# Patient Record
Sex: Male | Born: 1970 | Race: White | Hispanic: No | Marital: Single | Smoking: Never smoker
Health system: Southern US, Community
[De-identification: ages and names within clinical notes are randomized; demographics above are authoritative.]

## PROBLEM LIST (undated history)

## (undated) DIAGNOSIS — E119 Type 2 diabetes mellitus without complications: Secondary | ICD-10-CM

## (undated) DIAGNOSIS — I10 Essential (primary) hypertension: Secondary | ICD-10-CM

## (undated) DIAGNOSIS — N289 Disorder of kidney and ureter, unspecified: Secondary | ICD-10-CM

## (undated) DIAGNOSIS — M1990 Unspecified osteoarthritis, unspecified site: Secondary | ICD-10-CM

## (undated) DIAGNOSIS — M109 Gout, unspecified: Secondary | ICD-10-CM

## (undated) DIAGNOSIS — D649 Anemia, unspecified: Secondary | ICD-10-CM

## (undated) DIAGNOSIS — E7800 Pure hypercholesterolemia, unspecified: Secondary | ICD-10-CM

## (undated) HISTORY — PX: NO PAST SURGERIES: SHX2092

---

## 2014-02-28 ENCOUNTER — Ambulatory Visit (HOSPITAL_COMMUNITY)
Admission: RE | Admit: 2014-02-28 | Discharge: 2014-02-28 | Disposition: A | Discharge status: Home / Self Care | Payer: Disability Insurance | Source: Ambulatory Visit | Attending: Family Medicine | Admitting: Family Medicine

## 2014-02-28 ENCOUNTER — Other Ambulatory Visit (HOSPITAL_COMMUNITY): Payer: Self-pay | Admitting: Family Medicine

## 2014-02-28 DIAGNOSIS — M25461 Effusion, right knee: Secondary | ICD-10-CM | POA: Diagnosis not present

## 2014-02-28 DIAGNOSIS — M25562 Pain in left knee: Principal | ICD-10-CM

## 2014-02-28 DIAGNOSIS — M25462 Effusion, left knee: Secondary | ICD-10-CM | POA: Insufficient documentation

## 2014-02-28 DIAGNOSIS — M25561 Pain in right knee: Secondary | ICD-10-CM | POA: Diagnosis present

## 2015-10-18 IMAGING — CR DG KNEE 1-2V*R*
2 series · 2 of 2 positions shown · non-contrast
Comparison: None.

CLINICAL DATA: Bilateral knee pain and swelling for 4 years.
Patient was diagnosed with arthritis about a year ago. Disability
determination.

EXAM:
RIGHT KNEE - 1-2 VIEW

[view not recorded (1 of 2)]
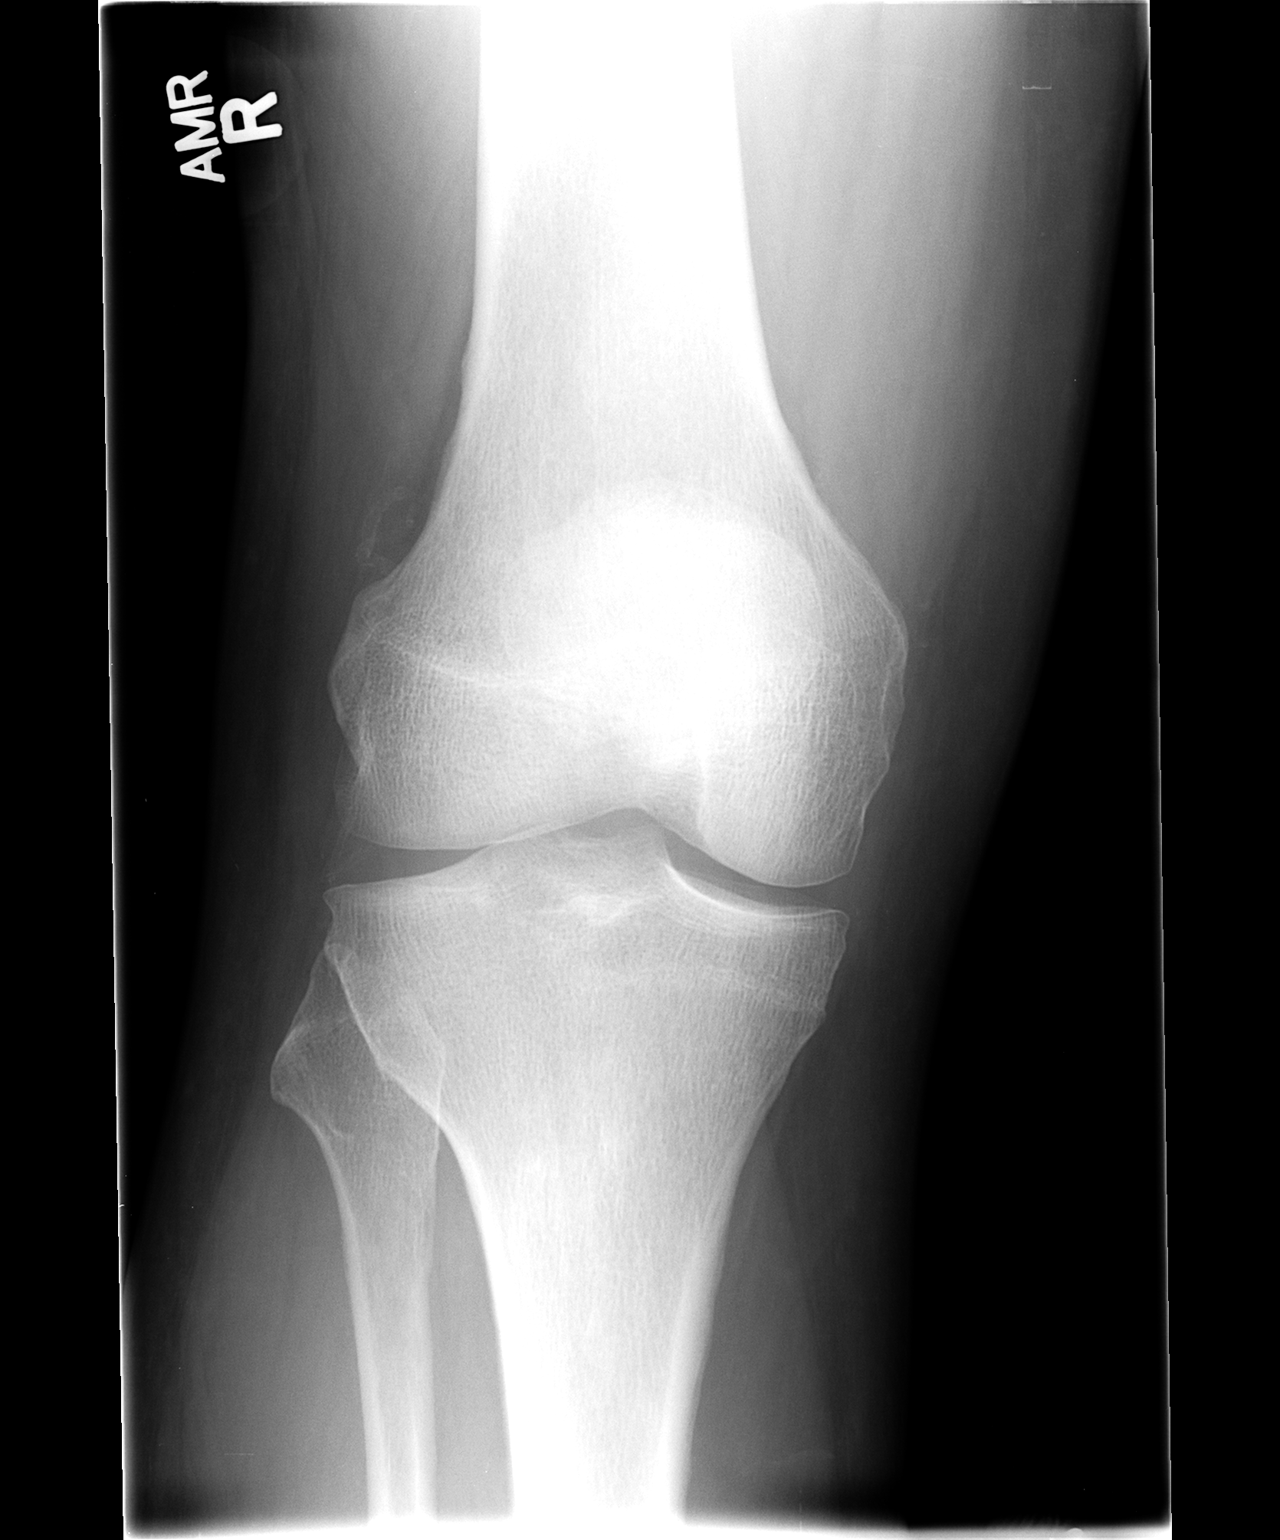

[view not recorded (2 of 2)]
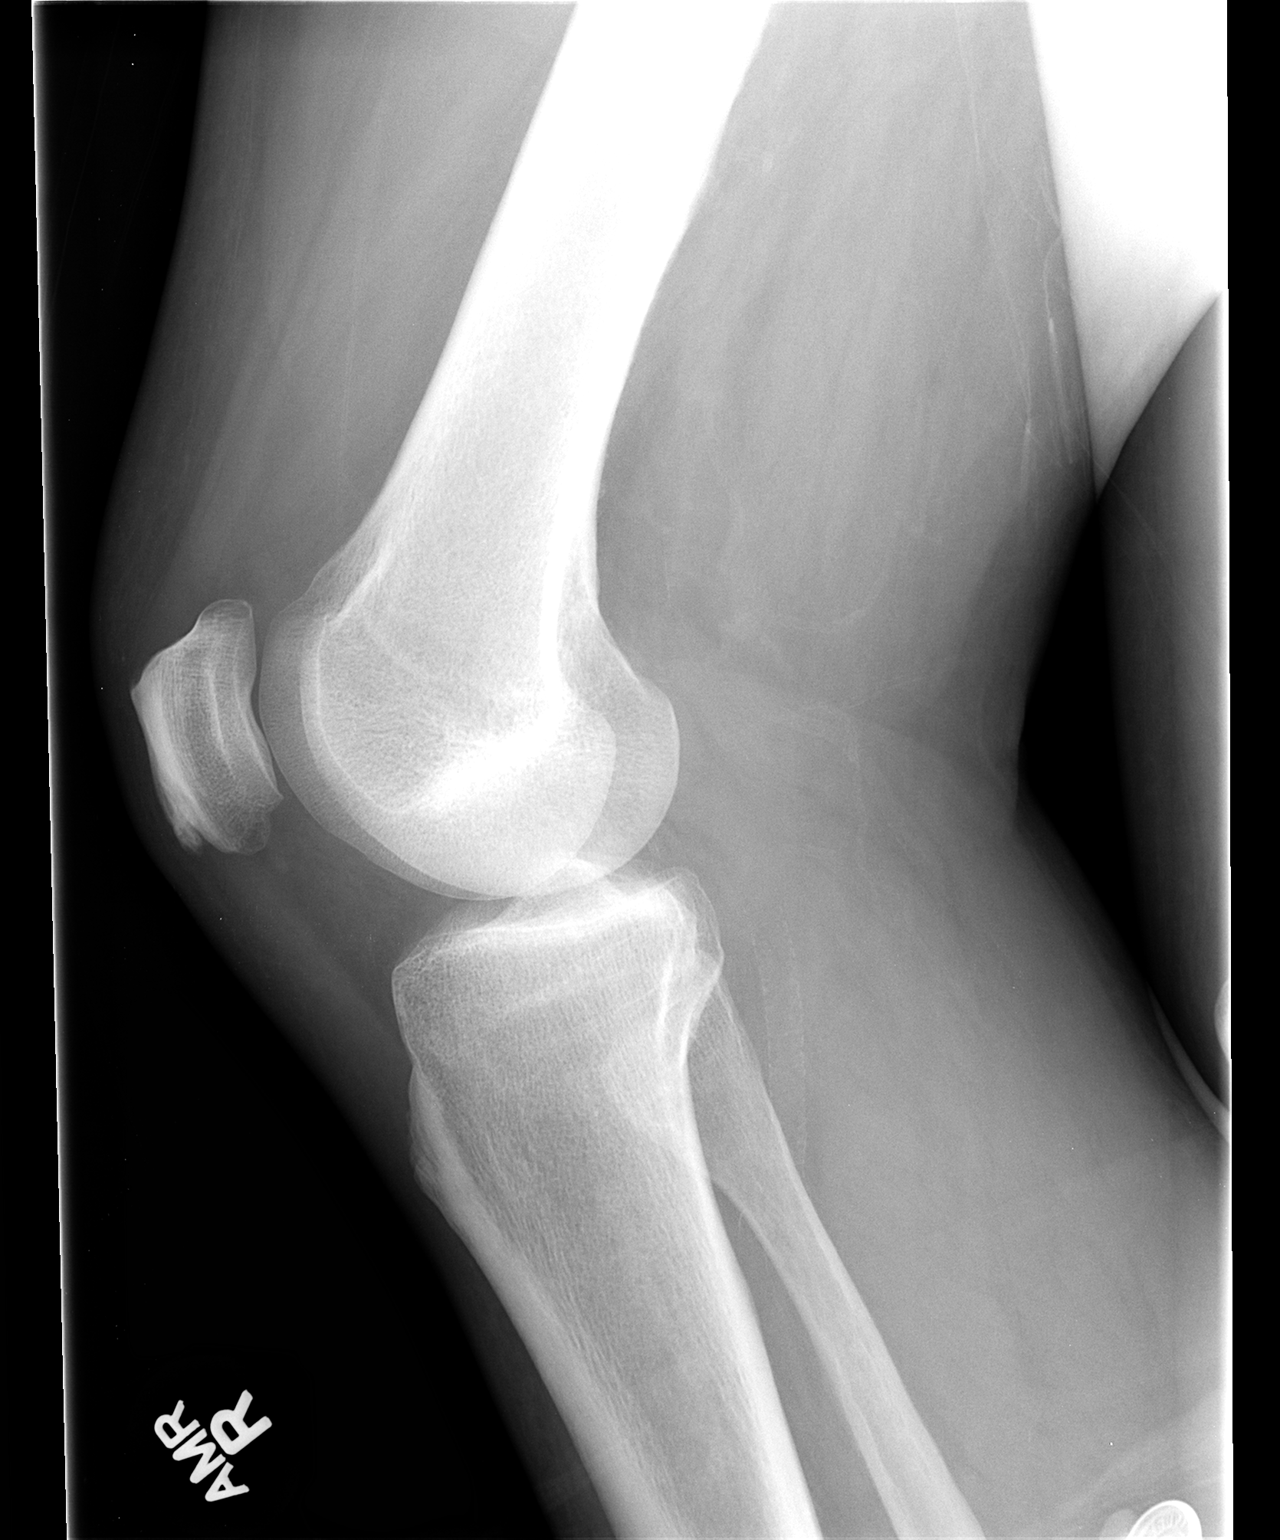

[2 of 2 positions shown; findings below may reference images not displayed]

FINDINGS: Small joint effusion is present. There is mild patellofemoral
degenerative change. No acute fracture or dislocation. There is
atherosclerotic calcification of the popliteal artery.
IMPRESSION: 1. Small joint effusion.
2. Mild degenerative change.

## 2016-05-24 ENCOUNTER — Other Ambulatory Visit (HOSPITAL_COMMUNITY): Payer: Self-pay | Admitting: Family

## 2016-05-24 DIAGNOSIS — N289 Disorder of kidney and ureter, unspecified: Secondary | ICD-10-CM

## 2016-05-31 ENCOUNTER — Ambulatory Visit (HOSPITAL_COMMUNITY)
Admission: RE | Admit: 2016-05-31 | Discharge: 2016-05-31 | Disposition: A | Discharge status: Home / Self Care | Payer: Self-pay | Source: Ambulatory Visit | Attending: Family | Admitting: Family

## 2016-05-31 DIAGNOSIS — N289 Disorder of kidney and ureter, unspecified: Secondary | ICD-10-CM

## 2017-05-31 ENCOUNTER — Other Ambulatory Visit (HOSPITAL_COMMUNITY)
Admission: RE | Admit: 2017-05-31 | Discharge: 2017-05-31 | Disposition: A | Discharge status: Home / Self Care | Payer: Self-pay | Source: Ambulatory Visit | Attending: *Deleted | Admitting: *Deleted

## 2017-05-31 DIAGNOSIS — R809 Proteinuria, unspecified: Secondary | ICD-10-CM | POA: Insufficient documentation

## 2017-05-31 DIAGNOSIS — N189 Chronic kidney disease, unspecified: Secondary | ICD-10-CM | POA: Insufficient documentation

## 2017-05-31 LAB — PROTEIN, URINE, 24 HOUR
Collection Interval-UPROT: 24 hours
Protein, Urine: 121 mg/dL
Urine Total Volume-UPROT: 1850 mL

## 2017-05-31 LAB — CREATININE CLEARANCE, URINE, 24 HOUR
COLLECTION INTERVAL-CRCL: 24 h
CREAT CLEAR: 56 mL/min — AB (ref 75–125)
CREATININE 24H UR: 1792 mg/d (ref 800–2000)
Creatinine, Urine: 96.84 mg/dL
URINE TOTAL VOLUME-CRCL: 1850 mL

## 2017-08-25 ENCOUNTER — Other Ambulatory Visit: Payer: Self-pay

## 2017-08-25 ENCOUNTER — Encounter (HOSPITAL_COMMUNITY): Payer: Self-pay | Admitting: Emergency Medicine

## 2017-08-25 ENCOUNTER — Observation Stay (HOSPITAL_COMMUNITY)
Admission: EM | Admit: 2017-08-25 | Discharge: 2017-08-26 | Disposition: A | Discharge status: Home / Self Care | Payer: Medicaid Other | Attending: Internal Medicine | Admitting: Internal Medicine

## 2017-08-25 DIAGNOSIS — E1121 Type 2 diabetes mellitus with diabetic nephropathy: Secondary | ICD-10-CM

## 2017-08-25 DIAGNOSIS — N189 Chronic kidney disease, unspecified: Secondary | ICD-10-CM

## 2017-08-25 DIAGNOSIS — N289 Disorder of kidney and ureter, unspecified: Secondary | ICD-10-CM

## 2017-08-25 DIAGNOSIS — N179 Acute kidney failure, unspecified: Secondary | ICD-10-CM | POA: Insufficient documentation

## 2017-08-25 DIAGNOSIS — Z7982 Long term (current) use of aspirin: Secondary | ICD-10-CM | POA: Insufficient documentation

## 2017-08-25 DIAGNOSIS — N183 Chronic kidney disease, stage 3 (moderate): Secondary | ICD-10-CM | POA: Insufficient documentation

## 2017-08-25 DIAGNOSIS — E875 Hyperkalemia: Principal | ICD-10-CM | POA: Diagnosis present

## 2017-08-25 DIAGNOSIS — Z6839 Body mass index (BMI) 39.0-39.9, adult: Secondary | ICD-10-CM

## 2017-08-25 DIAGNOSIS — Z79899 Other long term (current) drug therapy: Secondary | ICD-10-CM | POA: Insufficient documentation

## 2017-08-25 DIAGNOSIS — E1122 Type 2 diabetes mellitus with diabetic chronic kidney disease: Secondary | ICD-10-CM | POA: Insufficient documentation

## 2017-08-25 DIAGNOSIS — E6609 Other obesity due to excess calories: Secondary | ICD-10-CM

## 2017-08-25 DIAGNOSIS — Z7984 Long term (current) use of oral hypoglycemic drugs: Secondary | ICD-10-CM | POA: Insufficient documentation

## 2017-08-25 DIAGNOSIS — I10 Essential (primary) hypertension: Secondary | ICD-10-CM | POA: Diagnosis present

## 2017-08-25 DIAGNOSIS — I129 Hypertensive chronic kidney disease with stage 1 through stage 4 chronic kidney disease, or unspecified chronic kidney disease: Secondary | ICD-10-CM | POA: Insufficient documentation

## 2017-08-25 DIAGNOSIS — E1140 Type 2 diabetes mellitus with diabetic neuropathy, unspecified: Secondary | ICD-10-CM | POA: Insufficient documentation

## 2017-08-25 DIAGNOSIS — E785 Hyperlipidemia, unspecified: Secondary | ICD-10-CM

## 2017-08-25 HISTORY — DX: Pure hypercholesterolemia, unspecified: E78.00

## 2017-08-25 HISTORY — DX: Essential (primary) hypertension: I10

## 2017-08-25 HISTORY — DX: Disorder of kidney and ureter, unspecified: N28.9

## 2017-08-25 LAB — COMPREHENSIVE METABOLIC PANEL
ALBUMIN: 4.4 g/dL (ref 3.5–5.0)
ALK PHOS: 51 U/L (ref 38–126)
ALT: 29 U/L (ref 17–63)
AST: 27 U/L (ref 15–41)
Anion gap: 9 (ref 5–15)
BUN: 60 mg/dL — AB (ref 6–20)
CALCIUM: 9.6 mg/dL (ref 8.9–10.3)
CO2: 21 mmol/L — AB (ref 22–32)
CREATININE: 2.37 mg/dL — AB (ref 0.61–1.24)
Chloride: 106 mmol/L (ref 101–111)
GFR calc non Af Amer: 31 mL/min — ABNORMAL LOW (ref >60)
GFR, EST AFRICAN AMERICAN: 36 mL/min — AB (ref >60)
GLUCOSE: 141 mg/dL — AB (ref 65–99)
Potassium: 5.9 mmol/L — ABNORMAL HIGH (ref 3.5–5.1)
SODIUM: 136 mmol/L (ref 135–145)
Total Bilirubin: 0.5 mg/dL (ref 0.3–1.2)
Total Protein: 8.2 g/dL — ABNORMAL HIGH (ref 6.5–8.1)

## 2017-08-25 LAB — CBC WITH DIFFERENTIAL/PLATELET
Basophils Absolute: 0 10*3/uL (ref 0.0–0.1)
Basophils Relative: 0 %
EOS ABS: 0.1 10*3/uL (ref 0.0–0.7)
Eosinophils Relative: 2 %
HCT: 38.5 % — ABNORMAL LOW (ref 39.0–52.0)
HEMOGLOBIN: 12 g/dL — AB (ref 13.0–17.0)
LYMPHS ABS: 0.9 10*3/uL (ref 0.7–4.0)
LYMPHS PCT: 17 %
MCH: 25.7 pg — AB (ref 26.0–34.0)
MCHC: 31.2 g/dL (ref 30.0–36.0)
MCV: 82.4 fL (ref 78.0–100.0)
Monocytes Absolute: 0.5 10*3/uL (ref 0.1–1.0)
Monocytes Relative: 8 %
NEUTROS PCT: 73 %
Neutro Abs: 3.9 10*3/uL (ref 1.7–7.7)
Platelets: 167 10*3/uL (ref 150–400)
RBC: 4.67 MIL/uL (ref 4.22–5.81)
RDW: 13.9 % (ref 11.5–15.5)
WBC: 5.4 10*3/uL (ref 4.0–10.5)

## 2017-08-25 LAB — BASIC METABOLIC PANEL
ANION GAP: 9 (ref 5–15)
Anion gap: 8 (ref 5–15)
BUN: 58 mg/dL — AB (ref 6–20)
BUN: 57 mg/dL — AB (ref 6–20)
CALCIUM: 9.5 mg/dL (ref 8.9–10.3)
CALCIUM: 9.7 mg/dL (ref 8.9–10.3)
CO2: 20 mmol/L — AB (ref 22–32)
CO2: 19 mmol/L — ABNORMAL LOW (ref 22–32)
CREATININE: 2.24 mg/dL — AB (ref 0.61–1.24)
CREATININE: 2.2 mg/dL — AB (ref 0.61–1.24)
Chloride: 110 mmol/L (ref 101–111)
Chloride: 111 mmol/L (ref 101–111)
GFR calc Af Amer: 39 mL/min — ABNORMAL LOW (ref >60)
GFR calc Af Amer: 40 mL/min — ABNORMAL LOW (ref >60)
GFR calc non Af Amer: 33 mL/min — ABNORMAL LOW (ref >60)
GFR, EST NON AFRICAN AMERICAN: 34 mL/min — AB (ref >60)
GLUCOSE: 95 mg/dL (ref 65–99)
GLUCOSE: 196 mg/dL — AB (ref 65–99)
Potassium: 6.2 mmol/L — ABNORMAL HIGH (ref 3.5–5.1)
Potassium: 5.6 mmol/L — ABNORMAL HIGH (ref 3.5–5.1)
Sodium: 138 mmol/L (ref 135–145)
Sodium: 139 mmol/L (ref 135–145)

## 2017-08-25 LAB — GLUCOSE, CAPILLARY: Glucose-Capillary: 105 mg/dL — ABNORMAL HIGH (ref 65–99)

## 2017-08-25 MED ORDER — DEXTROSE 50 % IV SOLN
INTRAVENOUS | Status: AC
  Filled 2017-08-25: qty 50

## 2017-08-25 MED ORDER — SODIUM CHLORIDE 0.9 % IV SOLN
1.0000 g | Freq: Once | INTRAVENOUS | Status: AC
  Administered 2017-08-25: 1 g via INTRAVENOUS
  Filled 2017-08-25: qty 10

## 2017-08-25 MED ORDER — SODIUM POLYSTYRENE SULFONATE 15 GM/60ML PO SUSP
30.0000 g | Freq: Once | ORAL | Status: AC
  Administered 2017-08-25: 30 g via ORAL
  Filled 2017-08-25: qty 120

## 2017-08-25 MED ORDER — FUROSEMIDE 40 MG PO TABS
40.0000 mg | ORAL_TABLET | Freq: Every day | ORAL | Status: DC
  Administered 2017-08-26: 40 mg via ORAL
  Filled 2017-08-25: qty 1

## 2017-08-25 MED ORDER — SODIUM CHLORIDE 0.9 % IV SOLN
INTRAVENOUS | Status: DC
  Administered 2017-08-25: via INTRAVENOUS

## 2017-08-25 MED ORDER — DEXTROSE 50 % IV SOLN
50.0000 mL | Freq: Once | INTRAVENOUS | Status: AC
  Administered 2017-08-25: 50 mL via INTRAVENOUS

## 2017-08-25 MED ORDER — LABETALOL HCL 200 MG PO TABS
100.0000 mg | ORAL_TABLET | Freq: Two times a day (BID) | ORAL | Status: DC
  Administered 2017-08-25 – 2017-08-26 (×2): 100 mg via ORAL
  Filled 2017-08-25 (×2): qty 1

## 2017-08-25 MED ORDER — INSULIN ASPART 100 UNIT/ML ~~LOC~~ SOLN
SUBCUTANEOUS | Status: AC
  Filled 2017-08-25: qty 1

## 2017-08-25 MED ORDER — FUROSEMIDE 20 MG PO TABS
20.0000 mg | ORAL_TABLET | Freq: Every day | ORAL | Status: DC
  Administered 2017-08-26: 20 mg via ORAL
  Filled 2017-08-25: qty 1

## 2017-08-25 MED ORDER — INSULIN ASPART 100 UNIT/ML IV SOLN
10.0000 [IU] | Freq: Once | INTRAVENOUS | Status: AC
  Administered 2017-08-25: 10 [IU] via INTRAVENOUS

## 2017-08-25 MED ORDER — ASPIRIN EC 81 MG PO TBEC
81.0000 mg | DELAYED_RELEASE_TABLET | Freq: Every day | ORAL | Status: DC
  Administered 2017-08-26: 81 mg via ORAL
  Filled 2017-08-25: qty 1

## 2017-08-25 MED ORDER — SODIUM CHLORIDE 0.9 % IV BOLUS
1000.0000 mL | Freq: Once | INTRAVENOUS | Status: AC
  Administered 2017-08-25: 1000 mL via INTRAVENOUS

## 2017-08-25 MED ORDER — FUROSEMIDE 40 MG PO TABS: 20.0000 mg | ORAL_TABLET | Freq: Two times a day (BID) | ORAL | Status: DC

## 2017-08-25 MED ORDER — AMLODIPINE BESYLATE 5 MG PO TABS
10.0000 mg | ORAL_TABLET | Freq: Every day | ORAL | Status: DC
  Administered 2017-08-26: 10 mg via ORAL
  Filled 2017-08-25: qty 2

## 2017-08-25 NOTE — H&P (Signed)
History and Physical    Kevin Meyer CVE:[id number] DOB: [birth year] DOA: 08/25/2017  PCP: [facility] [place]  Patient coming from: Home  Chief Complaint: Abnormal labs  HPI: Kevin Meyer is a 47 y.o. male with medical history significant of saw his PCP yesterday had labs done was found to have a potassium level hypertension of 6 and referred to the emergency department for such.  He has been on lisinopril for about a year.  This was stopped by primary care physician today but he was sent to the ED for further evaluation.  Patient's received appropriate treatment for hyperkalemia in the ED and repeat potassium level is still persistently elevated.  Patient has a creatinine bump up to 2.3 also unknown what his baseline is.  Patient is asymptomatic and has no complaints.  Review of Systems: As per HPI otherwise 10 point review of systems negative.   Past Medical History:  Diagnosis Date  . High cholesterol   . Hypertension   . Renal disorder     History reviewed. No pertinent surgical history.   reports that he has never smoked. He has never used smokeless tobacco. He reports that he drinks alcohol. He reports that he does not use drugs.  Allergies  Allergen Reactions  . Gabapentin Anaphylaxis  . Lyrica [Pregabalin] Anaphylaxis    Family History  Problem Relation Age of Onset  . Stroke Mother   . Diabetes Other     Prior to Admission medications   Medication Sig Start Date End Date Taking? Authorizing Provider  amLODipine (NORVASC) 10 MG tablet Take 10 mg by mouth daily.   Yes [provider]  aspirin EC 81 MG tablet Take 81 mg by mouth daily.   Yes [provider]  furosemide (LASIX) 40 MG tablet Take 20-40 mg by mouth 2 (two) times daily. Patient take 40mg  in the morning and 20mg  at 1200pm   Yes [provider]  glipiZIDE (GLUCOTROL) 10 MG tablet Take 20 mg by mouth 2 (two) times daily before a meal.   Yes [provider]   labetalol (NORMODYNE) 100 MG tablet Take 100 mg by mouth 2 (two) times daily.   Yes [provider]  lisinopril (PRINIVIL,ZESTRIL) 20 MG tablet Take 20 mg by mouth daily.   Yes [provider]  lovastatin (MEVACOR) 20 MG tablet Take 20 mg by mouth at bedtime.   Yes [provider]    Physical Exam: Vitals:   08/25/17 1350 08/25/17 1700 08/25/17 1730  BP: (!) 147/79 136/72 135/76  Pulse: 95 88 87  Resp: 17    Temp: 98.4 F (36.9 C)    TempSrc: Oral    SpO2: 98% 98% 98%  Weight: 108 kg (238 lb)    Height: 5\' 6"  (1.676 m)        Constitutional: NAD, calm, comfortable Vitals:   08/25/17 1350 08/25/17 1700 08/25/17 1730  BP: (!) 147/79 136/72 135/76  Pulse: 95 88 87  Resp: 17    Temp: 98.4 F (36.9 C)    TempSrc: Oral    SpO2: 98% 98% 98%  Weight: 108 kg (238 lb)    Height: 5\' 6"  (1.676 m)     Eyes: PERRL, lids and conjunctivae normal ENMT: Mucous membranes are moist. Posterior pharynx clear of any exudate or lesions.Normal dentition.  Neck: normal, supple, no masses, no thyromegaly Respiratory: clear to auscultation bilaterally, no wheezing, no crackles. Normal respiratory effort. No accessory muscle use.  Cardiovascular: Regular rate and rhythm, no murmurs /  rubs / gallops. No extremity edema. 2+ pedal pulses. No carotid bruits.  Abdomen: no tenderness, no masses palpated. No hepatosplenomegaly. Bowel sounds positive.  Musculoskeletal: no clubbing / cyanosis. No joint deformity upper and lower extremities. Good ROM, no contractures. Normal muscle tone.  Skin: no rashes, lesions, ulcers. No induration Neurologic: CN 2-12 grossly intact. Sensation intact, DTR normal. Strength 5/5 in all 4.  Psychiatric: Normal judgment and insight. Alert and oriented x 3. Normal mood.    Labs on Admission: I have personally reviewed following labs and imaging studies  CBC: Recent Labs  Lab 08/25/17 1446  WBC 5.4  NEUTROABS 3.9  HGB 12.0*  HCT 38.5*  MCV  82.4  PLT 263   Basic Metabolic Panel: Recent Labs  Lab 08/25/17 1446 08/25/17 1834  NA 136 138  K 5.9* 6.2*  CL 106 110  CO2 21* 20*  GLUCOSE 141* 95  BUN 60* 58*  CREATININE 2.37* 2.24*  CALCIUM 9.6 9.5   GFR: Estimated Creatinine Clearance: 47.5 mL/min (A) (by C-G formula based on SCr of 2.24 mg/dL (H)). Liver Function Tests: Recent Labs  Lab 08/25/17 1446  AST 27  ALT 29  ALKPHOS 51  BILITOT 0.5  PROT 8.2*  ALBUMIN 4.4   No results for input(s): LIPASE, AMYLASE in the last 168 hours. No results for input(s): AMMONIA in the last 168 hours. Coagulation Profile: No results for input(s): INR, PROTIME in the last 168 hours. Cardiac Enzymes: No results for input(s): CKTOTAL, CKMB, CKMBINDEX, TROPONINI in the last 168 hours. BNP (last 3 results) No results for input(s): PROBNP in the last 8760 hours. HbA1C: No results for input(s): HGBA1C in the last 72 hours. CBG: No results for input(s): GLUCAP in the last 168 hours. Lipid Profile: No results for input(s): CHOL, HDL, LDLCALC, TRIG, CHOLHDL, LDLDIRECT in the last 72 hours. Thyroid Function Tests: No results for input(s): TSH, T4TOTAL, FREET4, T3FREE, THYROIDAB in the last 72 hours. Anemia Panel: No results for input(s): VITAMINB12, FOLATE, FERRITIN, TIBC, IRON, RETICCTPCT in the last 72 hours. Urine analysis: No results found for: COLORURINE, APPEARANCEUR, LABSPEC, PHURINE, GLUCOSEU, HGBUR, BILIRUBINUR, KETONESUR, PROTEINUR, UROBILINOGEN, NITRITE, LEUKOCYTESUR Sepsis Labs: !!!!!!!!!!!!!!!!!!!!!!!!!!!!!!!!!!!!!!!!!!!! @LABRCNTIP (procalcitonin:4,lacticidven:4) )No results found for this or any previous visit (from the past 240 hour(s)).   Radiological Exams on Admission: No results found.  EKG: Independently reviewed.  Normal sinus rhythm no acute changes  Old chart reviewed  Case discussed with EDP  Assessment/Plan 47 year old male history of hypertension comes in with hyperkalemia on ACE  inhibitor Principal Problem:   Hyperkalemia-stop ACE inhibitor.  Given Kayexalate calcium gluconate insulin and D50 in the ED.  We will repeat this at this time.  Repeat potassium level later tonight.  IV fluids overnight.  No EKG changes.  Active Problems:   Renal disorder-unknown what his baseline is repeat creatinine in the morning after some fluids   Hypertension-holding ACE inhibitor at this time    DVT prophylaxis: SCDs Code Status: Full Family Communication: None Disposition Plan: Per day team Consults called: None Admission status: Observation   Yuriel Lopezmartinez A MD [facility]  If 7PM-7AM, please contact night-coverage [email or web address] Password [facility]  08/25/2017, 7:42 PM

## 2017-08-25 NOTE — ED Triage Notes (Signed)
Patient went to PCP's on 5/2 and had routine check-up with blood drawn. Patient called today and told to come to ER for hyperkalemia of 6.3. Patient creatinine also elevated from 1.8 in January to 2.63. Repeat CMP requested with EKG and treatment.

## 2017-08-25 NOTE — ED Provider Notes (Signed)
[facility] EMERGENCY DEPARTMENT Provider Note   CSN: [id number] Arrival date & time: 08/25/17  1344     History   Chief Complaint Chief Complaint  Patient presents with  . Abnormal Lab    HPI Bliss Tsang is a 47 y.o. male.  HPI Patient was seen by his primary physician yesterday and had routine blood work performed.  Had elevated potassium to 6.3 and was advised to come to the emergency department.  Patient states that he is feeling well.  Currently denying any symptoms.  He has occasional muscle spasms.     Past Medical History:  Diagnosis Date  . High cholesterol   . Hypertension   . Renal disorder     Patient Active Problem List   Diagnosis Date Noted  . Hyperkalemia 08/25/2017    History reviewed. No pertinent surgical history.      Home Medications    Prior to Admission medications   Medication Sig Start Date End Date Taking? Authorizing Provider  amLODipine (NORVASC) 10 MG tablet Take 10 mg by mouth daily.   Yes [provider]  aspirin EC 81 MG tablet Take 81 mg by mouth daily.   Yes [provider]  furosemide (LASIX) 40 MG tablet Take 20-40 mg by mouth 2 (two) times daily. Patient take 40mg  in the morning and 20mg  at 1200pm   Yes [provider]  glipiZIDE (GLUCOTROL) 10 MG tablet Take 20 mg by mouth 2 (two) times daily before a meal.   Yes [provider]  labetalol (NORMODYNE) 100 MG tablet Take 100 mg by mouth 2 (two) times daily.   Yes [provider]  lisinopril (PRINIVIL,ZESTRIL) 20 MG tablet Take 20 mg by mouth daily.   Yes [provider]  lovastatin (MEVACOR) 20 MG tablet Take 20 mg by mouth at bedtime.   Yes [provider]    Family History Family History  Problem Relation Age of Onset  . Stroke Mother   . Diabetes Other     Social History Social History   Tobacco Use  . Smoking status: Never Smoker  . Smokeless tobacco: Never Used  Substance Use Topics  . Alcohol use:  Yes    Comment: occasional  . Drug use: Never     Allergies   Gabapentin and Lyrica [pregabalin]   Review of Systems Review of Systems  Constitutional: Negative for chills, fatigue and fever.  Respiratory: Negative for cough and shortness of breath.   Cardiovascular: Negative for chest pain, palpitations and leg swelling.  Gastrointestinal: Negative for abdominal pain, diarrhea, nausea and vomiting.  Genitourinary: Negative for dysuria, flank pain and frequency.  Musculoskeletal: Positive for myalgias. Negative for back pain, neck pain and neck stiffness.  Skin: Negative for rash and wound.  Neurological: Negative for dizziness, weakness, light-headedness, numbness and headaches.  All other systems reviewed and are negative.    Physical Exam Updated Vital Signs BP 135/76   Pulse 87   Temp 98.4 F (36.9 C) (Oral)   Resp 17   Ht 5\' 6"  (1.676 m)   Wt 108 kg (238 lb)   SpO2 98%   BMI 38.41 kg/m   Physical Exam  Constitutional: He is oriented to person, place, and time. He appears well-developed and well-nourished. No distress.  HENT:  Head: Normocephalic and atraumatic.  Mouth/Throat: Oropharynx is clear and moist. No oropharyngeal exudate.  Eyes: Pupils are equal, round, and reactive to light. EOM are normal.  Neck: Normal range of motion. Neck supple.  Cardiovascular: Normal rate and regular rhythm. Exam reveals no gallop and no friction rub.  No murmur heard. Pulmonary/Chest: Effort normal and breath sounds normal. No stridor. No respiratory distress. He has no wheezes. He has no rales. He exhibits no tenderness.  Abdominal: Soft. Bowel sounds are normal. There is no tenderness. There is no rebound and no guarding.  Musculoskeletal: Normal range of motion. He exhibits edema. He exhibits no tenderness.  1+ bilateral lower extremity pitting edema.  No asymmetry or tenderness.  Neurological: He is alert and oriented to person, place, and time.  Moves all extremities  without focal deficit.  Sensation fully intact.  Skin: Skin is warm and dry. Capillary refill takes less than 2 seconds. No rash noted. He is not diaphoretic. No erythema.  Psychiatric: He has a normal mood and affect. His behavior is normal.  Nursing note and vitals reviewed.    ED Treatments / Results  Labs (all labs ordered are listed, but only abnormal results are displayed) Labs Reviewed  CBC WITH DIFFERENTIAL/PLATELET - Abnormal; Notable for the following components:      Result Value   Hemoglobin 12.0 (*)    HCT 38.5 (*)    MCH 25.7 (*)    All other components within normal limits  COMPREHENSIVE METABOLIC PANEL - Abnormal; Notable for the following components:   Potassium 5.9 (*)    CO2 21 (*)    Glucose, Bld 141 (*)    BUN 60 (*)    Creatinine, Ser 2.37 (*)    Total Protein 8.2 (*)    GFR calc non Af Amer 31 (*)    GFR calc Af Amer 36 (*)    All other components within normal limits  BASIC METABOLIC PANEL - Abnormal; Notable for the following components:   Potassium 6.2 (*)    CO2 20 (*)    BUN 58 (*)    Creatinine, Ser 2.24 (*)    GFR calc non Af Amer 33 (*)    GFR calc Af Amer 39 (*)    All other components within normal limits    EKG EKG Interpretation  Date/Time:  Friday Aug 25 2017 13:58:30 EDT Ventricular Rate:  92 PR Interval:  156 QRS Duration: 88 QT Interval:  344 QTC Calculation: 425 R Axis:   36 Text Interpretation:  Normal sinus rhythm Possible Left atrial enlargement Borderline ECG No old tracing to compare Confirmed by Julianne Rice [phone]) on 08/25/2017 7:20:05 PM   Radiology No results found.  Procedures Procedures (including critical care time)  Medications Ordered in ED Medications  calcium gluconate 1 g in sodium chloride 0.9 % 100 mL IVPB (has no administration in time range)  insulin aspart (novoLOG) injection 10 Units (has no administration in time range)  dextrose 50 % solution 50 mL (has no administration in time range)   sodium chloride 0.9 % bolus 1,000 mL (has no administration in time range)  sodium polystyrene (KAYEXALATE) 15 GM/60ML suspension 30 g (has no administration in time range)  sodium chloride 0.9 % bolus 1,000 mL (0 mLs Intravenous Stopped 08/25/17 1742)  insulin aspart (novoLOG) injection 10 Units (10 Units Intravenous Given 08/25/17 1644)  dextrose 50 % solution 50 mL (50 mLs Intravenous Given 08/25/17 1645)   CRITICAL CARE Performed by: Julianne Rice Total critical care time: 30 minutes Critical care time was exclusive of separately billable procedures and treating other patients. Critical care was necessary to treat or prevent imminent or life-threatening deterioration. Critical care was time spent personally  by me on the following activities: development of treatment plan with patient and/or surrogate as well as nursing, discussions with consultants, evaluation of patient's response to treatment, examination of patient, obtaining history from patient or surrogate, ordering and performing treatments and interventions, ordering and review of laboratory studies, ordering and review of radiographic studies, pulse oximetry and re-evaluation of patient's condition.  Initial Impression / Assessment and Plan / ED Course  I have reviewed the triage vital signs and the nursing notes.  Pertinent labs & imaging results that were available during my care of the patient were reviewed by me and considered in my medical decision making (see chart for details).    Patient given IV fluids, insulin and glucose.  Hemodynamically stable.  Repeat BMP with persistently elevated potassium though creatinine is improving.  Discussed with hospitalist.  Will repeat insulin, glucose, IV fluids and give calcium gluconate and Kayexalate.  Hospitalist to admit.  Final Clinical Impressions(s) / ED Diagnoses   Final diagnoses:  Hyperkalemia  AKI (acute kidney injury) [facility])    ED Discharge Orders    None        Julianne Rice, MD 08/25/17 1921

## 2017-08-26 ENCOUNTER — Encounter (HOSPITAL_COMMUNITY): Payer: Self-pay | Admitting: *Deleted

## 2017-08-26 DIAGNOSIS — N183 Chronic kidney disease, stage 3 (moderate): Secondary | ICD-10-CM

## 2017-08-26 DIAGNOSIS — E6609 Other obesity due to excess calories: Secondary | ICD-10-CM

## 2017-08-26 DIAGNOSIS — Z6839 Body mass index (BMI) 39.0-39.9, adult: Secondary | ICD-10-CM

## 2017-08-26 DIAGNOSIS — E785 Hyperlipidemia, unspecified: Secondary | ICD-10-CM

## 2017-08-26 DIAGNOSIS — E1121 Type 2 diabetes mellitus with diabetic nephropathy: Secondary | ICD-10-CM

## 2017-08-26 DIAGNOSIS — N179 Acute kidney failure, unspecified: Secondary | ICD-10-CM

## 2017-08-26 DIAGNOSIS — N189 Chronic kidney disease, unspecified: Secondary | ICD-10-CM

## 2017-08-26 LAB — BASIC METABOLIC PANEL
ANION GAP: 10 (ref 5–15)
BUN: 50 mg/dL — ABNORMAL HIGH (ref 6–20)
CHLORIDE: 108 mmol/L (ref 101–111)
CO2: 20 mmol/L — AB (ref 22–32)
Calcium: 9.2 mg/dL (ref 8.9–10.3)
Creatinine, Ser: 2.08 mg/dL — ABNORMAL HIGH (ref 0.61–1.24)
GFR calc Af Amer: 42 mL/min — ABNORMAL LOW (ref >60)
GFR calc non Af Amer: 37 mL/min — ABNORMAL LOW (ref >60)
GLUCOSE: 71 mg/dL (ref 65–99)
Potassium: 4.7 mmol/L (ref 3.5–5.1)
Sodium: 138 mmol/L (ref 135–145)

## 2017-08-26 LAB — CBC
HEMATOCRIT: 36.6 % — AB (ref 39.0–52.0)
HEMOGLOBIN: 11.6 g/dL — AB (ref 13.0–17.0)
MCH: 26.2 pg (ref 26.0–34.0)
MCHC: 31.7 g/dL (ref 30.0–36.0)
MCV: 82.8 fL (ref 78.0–100.0)
Platelets: 155 10*3/uL (ref 150–400)
RBC: 4.42 MIL/uL (ref 4.22–5.81)
RDW: 13.9 % (ref 11.5–15.5)
WBC: 5.7 10*3/uL (ref 4.0–10.5)

## 2017-08-26 MED ORDER — GLIPIZIDE 10 MG PO TABS: 10.0000 mg | ORAL_TABLET | Freq: Two times a day (BID) | ORAL | Status: AC

## 2017-08-26 NOTE — Discharge Summary (Signed)
Physician Discharge Summary  Kevin Meyer ZOX:[id number] DOB: 1970-07-23 DOA: 08/25/2017  PCP: [facility] Manly Calzada date: 11/23/1912 Discharge date: 08/26/2017  Time spent: 35 minutes  Recommendations for Outpatient Follow-up:  Repeat basic metabolic panel to follow electrolytes and renal function Reassess blood pressure and further adjust antihypertensive regimen as needed. Follow CBGs and adjust hypoglycemic regimen if required  Please arrange outpatient follow-up with nephrologist (patient with CKD stage 3 and very young; would benefit of being plugged in and follow routinely, in case he ended requiring HD).    Discharge Diagnoses:  Principal Problem:   Hyperkalemia Active Problems:   Renal disorder   Hypertension   Type 2 diabetes with nephropathy (HCC)   Acute renal failure superimposed on stage 3 chronic kidney disease (HCC)   Class 2 obesity due to excess calories with body mass index (BMI) of 39.0 to 39.9 in adult   Hyperlipidemia   Discharge Condition: Stable and improved.  Patient has been discharged home with instruction to follow with PCP in 10 days.  Diet recommendation: Heart healthy and modified carbohydrate diet.  Filed Weights   08/25/17 1350 08/25/17 2056 08/26/17 0525  Weight: 108 kg (238 lb) 109.1 kg (240 lb 8 oz) 110.7 kg (244 lb 1.6 oz)    History of present illness:  As per H&P written by Dr. Shanon Brow on 08/25/2017. 47 y.o. male with medical history significant for hypertension, chronic kidney disease stage III, type 2 diabetes mellitus (not chronically on insulin), hyperlipidemia and knee osteoarthritis. Who presented to ED as requested by his PCP due to abnormal blood work. Patient saw his PCP yesterday had labs done was found to have a potassium level of 6 and referred to the emergency department for such.  He has been on lisinopril for about a year.  This was stopped by primary care physician on day of admisison but he was sent to the ED for  further evaluation.  Patient's received appropriate treatment for hyperkalemia in the ED and repeat potassium level was elevated.  Patient has a creatinine bump up to 2.3, even unknown what his baseline is.  Patient is asymptomatic and has no complaints.  Hospital Course:  1-hyperkalemia: In the setting of mild dehydration and continue use of lisinopril. -Patient received fluid resuscitation, discontinuation of ACE inhibitor, D50/insulin, sodium bicarb and calcium gluconate. -Also a dose of Kayexalate was provided -Patient potassium improved to return back to normal range (at discharge 4.7) -Patient has been advised to keep himself well-hydrated and not to take any further lisinopril and to follow-up with his PCP. -Recommending repeat basic metabolic panel follow-up to reassess electrolytes trend.  2-acute on chronic renal failure: Patient with stage III kidney disease as per GFR -Advised to keep himself hydrated -Lisinopril currently discontinue -Controlled patient will benefit of low-dose ARB -Advised to follow heart healthy diet -With fluid resuscitation his creatinine was 2.0 at the moment of discharge.  3-essential hypertension -We will resume the use of amlodipine, labetalol and Lasix -Lisinopril has been discontinued in the setting of acute on chronic renal failure and hyperkalemia -Patient advised to follow low-sodium/heart healthy diet and to avoid the use of NSAIDs.  4-type 2 diabetes mellitus with nephropathy -Most recent A1c 6.3 -Patient reporting experiencing couple episodes of hypoglycemia prior to admission -Patient's glipizide has been cutting half (at discharge has been instructed to use only 10 mg by mouth twice a day) -advise to follow modified carbohydrate diets and continue losing weight.  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 -low  calorie diet, portion control and weight loss discussed with patient.   Procedures:  None   Consultations:  None   Discharge Exam: Vitals:   08/25/17 2056 08/26/17 0525  BP: (!) 152/92 (!) 161/90  Pulse: 99 (!) 108  Resp: 18 18  Temp: 97.7 F (36.5 C) 98.1 F (36.7 C)  SpO2: 99% 98%    General: Afebrile, no chest pain, no nausea, no vomiting, no shortness of breath.  Patient feeling great and would like to go home. Cardiovascular: S1 and S2, no rubs, no gallops, no murmurs. Respiratory: Clear to auscultation bilaterally Abdomen: Soft, nontender, nondistended, positive bowel sounds. Extremities: trace edema bilaterally, no cyanosis, no clubbing.  Discharge Instructions   Discharge Instructions    Diet - low sodium heart healthy   Complete by:  As directed    Diet Carb Modified   Complete by:  As directed    Discharge instructions   Complete by:  As directed    Stable and improved. Keep yourself well-hydrated Arrange follow-up with PCP in 10 days Follow heart healthy/modified carb diet  Stop lisinopril as discussed     Allergies as of 08/26/2017      Reactions   Gabapentin Anaphylaxis   Lyrica [pregabalin] Anaphylaxis      Medication List    STOP taking these medications   lisinopril 20 MG tablet Commonly known as:  PRINIVIL,ZESTRIL     TAKE these medications   amLODipine 10 MG tablet Commonly known as:  NORVASC Take 10 mg by mouth daily.   aspirin EC 81 MG tablet Take 81 mg by mouth daily.   furosemide 40 MG tablet Commonly known as:  LASIX Take 20-40 mg by mouth 2 (two) times daily. Patient take 40mg  in the morning and 20mg  at 1200pm   glipiZIDE 10 MG tablet Commonly known as:  GLUCOTROL Take 1 tablet (10 mg total) by mouth 2 (two) times daily before a meal. What changed:  how much to take   labetalol 100 MG tablet Commonly known as:  NORMODYNE Take 100 mg by mouth 2 (two) times daily.   lovastatin 20 MG tablet Commonly known as:   MEVACOR Take 20 mg by mouth at bedtime.      Allergies  Allergen Reactions  . Gabapentin Anaphylaxis  . Lyrica [Pregabalin] Anaphylaxis   Follow-up Information    [facility]. Schedule an appointment as soon as possible for a visit in 10 day(s).   Contact information: [address] [place] [address] [phone]           The results of significant diagnostics from this hospitalization (including imaging, microbiology, ancillary and laboratory) are listed below for reference.     Labs: Basic Metabolic Panel: Recent Labs  Lab 08/25/17 1446 08/25/17 1834 08/25/17 2155 08/26/17 0632  NA 136 138 139 138  K 5.9* 6.2* 5.6* 4.7  CL 106 110 111 108  CO2 21* 20* 19* 20*  GLUCOSE 141* 95 196* 71  BUN 60* 58* 57* 50*  CREATININE 2.37* 2.24* 2.20* 2.08*  CALCIUM 9.6 9.5 9.7 9.2   Liver Function Tests: Recent Labs  Lab 08/25/17 1446  AST 27  ALT 29  ALKPHOS 51  BILITOT 0.5  PROT 8.2*  ALBUMIN 4.4   CBC: Recent Labs  Lab 08/25/17 1446 08/26/17 0632  WBC 5.4 5.7  NEUTROABS  3.9  --   HGB 12.0* 11.6*  HCT 38.5* 36.6*  MCV 82.4 82.8  PLT 167 155    CBG: Recent Labs  Lab 08/25/17 2103  GLUCAP 105*    Signed:  Barton Dubois MD.  [facility] 08/26/2017, 1:21 PM

## 2017-08-26 NOTE — Progress Notes (Signed)
Patient states understanding of discharge instructions.  

## 2017-08-26 NOTE — Progress Notes (Signed)
Pt gained 4lbs overnight. Pt very upset and refusing IV fluid. Fluids were stopped and MD made aware.

## 2017-08-27 LAB — HIV ANTIBODY (ROUTINE TESTING W REFLEX): HIV Screen 4th Generation wRfx: NONREACTIVE

## 2017-09-08 ENCOUNTER — Observation Stay (HOSPITAL_COMMUNITY)
Admission: EM | Admit: 2017-09-08 | Discharge: 2017-09-10 | Disposition: A | Discharge status: Home / Self Care | Payer: Medicaid Other | Attending: Internal Medicine | Admitting: Internal Medicine

## 2017-09-08 ENCOUNTER — Encounter (HOSPITAL_COMMUNITY): Payer: Self-pay

## 2017-09-08 ENCOUNTER — Other Ambulatory Visit: Payer: Self-pay

## 2017-09-08 DIAGNOSIS — E6609 Other obesity due to excess calories: Secondary | ICD-10-CM

## 2017-09-08 DIAGNOSIS — E875 Hyperkalemia: Principal | ICD-10-CM

## 2017-09-08 DIAGNOSIS — N189 Chronic kidney disease, unspecified: Secondary | ICD-10-CM | POA: Diagnosis present

## 2017-09-08 DIAGNOSIS — E785 Hyperlipidemia, unspecified: Secondary | ICD-10-CM | POA: Diagnosis present

## 2017-09-08 DIAGNOSIS — E1122 Type 2 diabetes mellitus with diabetic chronic kidney disease: Secondary | ICD-10-CM | POA: Insufficient documentation

## 2017-09-08 DIAGNOSIS — E1121 Type 2 diabetes mellitus with diabetic nephropathy: Secondary | ICD-10-CM

## 2017-09-08 DIAGNOSIS — N183 Chronic kidney disease, stage 3 (moderate): Secondary | ICD-10-CM | POA: Insufficient documentation

## 2017-09-08 DIAGNOSIS — Z7982 Long term (current) use of aspirin: Secondary | ICD-10-CM | POA: Insufficient documentation

## 2017-09-08 DIAGNOSIS — N179 Acute kidney failure, unspecified: Secondary | ICD-10-CM

## 2017-09-08 DIAGNOSIS — Z6839 Body mass index (BMI) 39.0-39.9, adult: Secondary | ICD-10-CM

## 2017-09-08 DIAGNOSIS — I129 Hypertensive chronic kidney disease with stage 1 through stage 4 chronic kidney disease, or unspecified chronic kidney disease: Secondary | ICD-10-CM | POA: Insufficient documentation

## 2017-09-08 DIAGNOSIS — Z79899 Other long term (current) drug therapy: Secondary | ICD-10-CM | POA: Insufficient documentation

## 2017-09-08 DIAGNOSIS — I10 Essential (primary) hypertension: Secondary | ICD-10-CM

## 2017-09-08 HISTORY — DX: Type 2 diabetes mellitus without complications: E11.9

## 2017-09-08 LAB — BASIC METABOLIC PANEL
Anion gap: 7 (ref 5–15)
Anion gap: 8 (ref 5–15)
BUN: 75 mg/dL — ABNORMAL HIGH (ref 6–20)
BUN: 69 mg/dL — AB (ref 6–20)
CALCIUM: 8.6 mg/dL — AB (ref 8.9–10.3)
CO2: 24 mmol/L (ref 22–32)
CO2: 22 mmol/L (ref 22–32)
CREATININE: 2.64 mg/dL — AB (ref 0.61–1.24)
CREATININE: 2.53 mg/dL — AB (ref 0.61–1.24)
Calcium: 9.3 mg/dL (ref 8.9–10.3)
Chloride: 106 mmol/L (ref 101–111)
Chloride: 105 mmol/L (ref 101–111)
GFR calc non Af Amer: 27 mL/min — ABNORMAL LOW (ref >60)
GFR calc non Af Amer: 29 mL/min — ABNORMAL LOW (ref >60)
GFR, EST AFRICAN AMERICAN: 32 mL/min — AB (ref >60)
GFR, EST AFRICAN AMERICAN: 33 mL/min — AB (ref >60)
GLUCOSE: 160 mg/dL — AB (ref 65–99)
Glucose, Bld: 202 mg/dL — ABNORMAL HIGH (ref 65–99)
Potassium: 6.5 mmol/L (ref 3.5–5.1)
Potassium: 5.9 mmol/L — ABNORMAL HIGH (ref 3.5–5.1)
SODIUM: 135 mmol/L (ref 135–145)
Sodium: 137 mmol/L (ref 135–145)

## 2017-09-08 LAB — CBC WITH DIFFERENTIAL/PLATELET
Basophils Absolute: 0 10*3/uL (ref 0.0–0.1)
Basophils Relative: 0 %
Eosinophils Absolute: 0.1 10*3/uL (ref 0.0–0.7)
Eosinophils Relative: 2 %
HEMATOCRIT: 34.5 % — AB (ref 39.0–52.0)
HEMOGLOBIN: 11 g/dL — AB (ref 13.0–17.0)
LYMPHS ABS: 1.1 10*3/uL (ref 0.7–4.0)
LYMPHS PCT: 20 %
MCH: 26.1 pg (ref 26.0–34.0)
MCHC: 31.9 g/dL (ref 30.0–36.0)
MCV: 81.9 fL (ref 78.0–100.0)
MONOS PCT: 8 %
Monocytes Absolute: 0.4 10*3/uL (ref 0.1–1.0)
NEUTROS PCT: 70 %
Neutro Abs: 3.8 10*3/uL (ref 1.7–7.7)
Platelets: 128 10*3/uL — ABNORMAL LOW (ref 150–400)
RBC: 4.21 MIL/uL — ABNORMAL LOW (ref 4.22–5.81)
RDW: 14.1 % (ref 11.5–15.5)
WBC: 5.4 10*3/uL (ref 4.0–10.5)

## 2017-09-08 LAB — GLUCOSE, CAPILLARY
GLUCOSE-CAPILLARY: 126 mg/dL — AB (ref 65–99)
GLUCOSE-CAPILLARY: 93 mg/dL (ref 65–99)

## 2017-09-08 LAB — HEMOGLOBIN A1C
HEMOGLOBIN A1C: 6.1 % — AB (ref 4.8–5.6)
Mean Plasma Glucose: 128.37 mg/dL

## 2017-09-08 LAB — MAGNESIUM: MAGNESIUM: 2.4 mg/dL (ref 1.7–2.4)

## 2017-09-08 LAB — PHOSPHORUS: PHOSPHORUS: 5.1 mg/dL — AB (ref 2.5–4.6)

## 2017-09-08 MED ORDER — SODIUM CHLORIDE 0.9% FLUSH
3.0000 mL | Freq: Two times a day (BID) | INTRAVENOUS | Status: DC
  Administered 2017-09-08 – 2017-09-09 (×3): 3 mL via INTRAVENOUS

## 2017-09-08 MED ORDER — DEXTROSE 50 % IV SOLN
INTRAVENOUS | Status: AC
  Filled 2017-09-08: qty 50

## 2017-09-08 MED ORDER — ACETAMINOPHEN 325 MG PO TABS: 650.0000 mg | ORAL_TABLET | Freq: Four times a day (QID) | ORAL | Status: DC | PRN

## 2017-09-08 MED ORDER — ONDANSETRON HCL 4 MG/2ML IJ SOLN: 4.0000 mg | Freq: Four times a day (QID) | INTRAMUSCULAR | Status: DC | PRN

## 2017-09-08 MED ORDER — ASPIRIN EC 81 MG PO TBEC
81.0000 mg | DELAYED_RELEASE_TABLET | Freq: Every day | ORAL | Status: DC
  Administered 2017-09-09 – 2017-09-10 (×2): 81 mg via ORAL
  Filled 2017-09-08 (×2): qty 1

## 2017-09-08 MED ORDER — SODIUM CHLORIDE 0.9 % IV BOLUS
500.0000 mL | Freq: Once | INTRAVENOUS | Status: AC
  Administered 2017-09-08: 500 mL via INTRAVENOUS

## 2017-09-08 MED ORDER — PRAVASTATIN SODIUM 10 MG PO TABS
20.0000 mg | ORAL_TABLET | Freq: Every day | ORAL | Status: DC
  Administered 2017-09-08 – 2017-09-09 (×2): 20 mg via ORAL
  Filled 2017-09-08 (×2): qty 2

## 2017-09-08 MED ORDER — ONDANSETRON HCL 4 MG PO TABS: 4.0000 mg | ORAL_TABLET | Freq: Four times a day (QID) | ORAL | Status: DC | PRN

## 2017-09-08 MED ORDER — SODIUM CHLORIDE 0.9 % IV SOLN
INTRAVENOUS | Status: DC
  Administered 2017-09-08: 16:00:00 via INTRAVENOUS

## 2017-09-08 MED ORDER — INSULIN ASPART 100 UNIT/ML ~~LOC~~ SOLN: 0.0000 [IU] | Freq: Every day | SUBCUTANEOUS | Status: DC

## 2017-09-08 MED ORDER — AMLODIPINE BESYLATE 5 MG PO TABS
10.0000 mg | ORAL_TABLET | Freq: Every day | ORAL | Status: DC
  Administered 2017-09-09 – 2017-09-10 (×2): 10 mg via ORAL
  Filled 2017-09-08 (×2): qty 2

## 2017-09-08 MED ORDER — ALBUTEROL (5 MG/ML) CONTINUOUS INHALATION SOLN
10.0000 mg/h | INHALATION_SOLUTION | Freq: Once | RESPIRATORY_TRACT | Status: AC
  Administered 2017-09-08: 10 mg/h via RESPIRATORY_TRACT
  Filled 2017-09-08: qty 20

## 2017-09-08 MED ORDER — DEXTROSE 50 % IV SOLN
50.0000 mL | Freq: Once | INTRAVENOUS | Status: AC
  Administered 2017-09-08: 50 mL via INTRAVENOUS

## 2017-09-08 MED ORDER — SODIUM CHLORIDE 0.9 % IV SOLN
1.0000 g | Freq: Once | INTRAVENOUS | Status: AC
  Administered 2017-09-08: 1 g via INTRAVENOUS
  Filled 2017-09-08: qty 10

## 2017-09-08 MED ORDER — ACETAMINOPHEN 650 MG RE SUPP: 650.0000 mg | Freq: Four times a day (QID) | RECTAL | Status: DC | PRN

## 2017-09-08 MED ORDER — SODIUM POLYSTYRENE SULFONATE 15 GM/60ML PO SUSP
30.0000 g | Freq: Once | ORAL | Status: AC
  Administered 2017-09-08: 30 g via ORAL
  Filled 2017-09-08: qty 120

## 2017-09-08 MED ORDER — SODIUM CHLORIDE 0.9 % IV SOLN
INTRAVENOUS | Status: AC
  Administered 2017-09-08 – 2017-09-09 (×3): via INTRAVENOUS

## 2017-09-08 MED ORDER — INSULIN ASPART 100 UNIT/ML ~~LOC~~ SOLN
10.0000 [IU] | Freq: Once | SUBCUTANEOUS | Status: AC
  Administered 2017-09-08: 10 [IU] via INTRAVENOUS
  Filled 2017-09-08: qty 1

## 2017-09-08 MED ORDER — INSULIN ASPART 100 UNIT/ML ~~LOC~~ SOLN
0.0000 [IU] | Freq: Three times a day (TID) | SUBCUTANEOUS | Status: DC
  Administered 2017-09-09 – 2017-09-10 (×2): 1 [IU] via SUBCUTANEOUS

## 2017-09-08 MED ORDER — LABETALOL HCL 200 MG PO TABS
100.0000 mg | ORAL_TABLET | Freq: Two times a day (BID) | ORAL | Status: DC
  Administered 2017-09-08 – 2017-09-10 (×4): 100 mg via ORAL
  Filled 2017-09-08 (×4): qty 1

## 2017-09-08 MED ORDER — HEPARIN SODIUM (PORCINE) 5000 UNIT/ML IJ SOLN
5000.0000 [IU] | Freq: Three times a day (TID) | INTRAMUSCULAR | Status: DC
  Administered 2017-09-08 – 2017-09-10 (×6): 5000 [IU] via SUBCUTANEOUS
  Filled 2017-09-08 (×6): qty 1

## 2017-09-08 MED ORDER — SODIUM CHLORIDE 0.9 % IV BOLUS: 1000.0000 mL | Freq: Once | INTRAVENOUS | Status: DC

## 2017-09-08 NOTE — ED Provider Notes (Signed)
[facility] EMERGENCY DEPARTMENT Provider Note   CSN: [id number] Arrival date & time: 09/08/17  0938     History   Chief Complaint Chief Complaint  Patient presents with  . hyperkalemia    HPI Kevin Meyer is a 47 y.o. male.  HPI  Pt was seen at 1010. Per pt, c/o unknown onset and persistence of constant "abnormal labs" that [facility]. Pt was told his potassium was "6.4" and he was sent to the ED for further evaluation. Pt has hx of same symptoms 2 weeks ago, tx for hyperkalemia, dx CKD, and instructed to f/u as outpatient with Renal MD. Pt f/u with PMD yesterday, has not f/u with Renal MD. Denies CP/SOB, no abd pain, no N/V/D, no back pain, no fevers, no rash.   Past Medical History:  Diagnosis Date  . Diabetes mellitus without complication ([place])   . High cholesterol   . Hypertension   . Renal disorder     Patient Active Problem List   Diagnosis Date Noted  . Type 2 diabetes with nephropathy ([place])   . Acute renal failure superimposed on stage 3 chronic kidney disease ([place])   . Class 2 obesity due to excess calories with body mass index (BMI) of 39.0 to 39.9 in adult   . Hyperlipidemia   . Hyperkalemia 08/25/2017  . Renal disorder   . Hypertension   . AKI (acute kidney injury) ([place])     History reviewed. No pertinent surgical history.      Home Medications    Prior to Admission medications   Medication Sig Start Date End Date Taking? Authorizing Provider  amLODipine (NORVASC) 10 MG tablet Take 10 mg by mouth daily.   Yes [provider]  aspirin EC 81 MG tablet Take 81 mg by mouth daily.   Yes [provider]  furosemide (LASIX) 40 MG tablet Take 20-40 mg by mouth 2 (two) times daily. Patient take 40mg  in the morning and 20mg  at 1200pm   Yes [provider]  glipiZIDE (GLUCOTROL) 10 MG tablet Take 1 tablet (10 mg total) by mouth 2 (two) times daily before a meal. 08/26/17  Yes Barton Dubois, MD   labetalol (NORMODYNE) 100 MG tablet Take 100 mg by mouth 2 (two) times daily.   Yes [provider]  lovastatin (MEVACOR) 20 MG tablet Take 20 mg by mouth at bedtime.   Yes [provider]    Family History Family History  Problem Relation Age of Onset  . Stroke Mother   . Diabetes Other     Social History Social History   Tobacco Use  . Smoking status: Never Smoker  . Smokeless tobacco: Never Used  Substance Use Topics  . Alcohol use: Never    Frequency: Never    Comment: occasional  . Drug use: Never     Allergies   Gabapentin and Lyrica [pregabalin]   Review of Systems Review of Systems ROS: Statement: All systems negative except as marked or noted in the HPI; Constitutional: Negative for fever and chills. ; ; Eyes: Negative for eye pain, redness and discharge. ; ; ENMT: Negative for ear pain, hoarseness, nasal congestion, sinus pressure and sore throat. ; ; Cardiovascular: Negative for chest pain, palpitations, diaphoresis, dyspnea and peripheral edema. ; ; Respiratory: Negative for cough, wheezing and stridor. ; ; Gastrointestinal: Negative for nausea, vomiting, diarrhea, abdominal pain, blood in stool, hematemesis, jaundice and rectal bleeding. . ; ; Genitourinary: Negative for dysuria, flank pain  and hematuria. ; ; Musculoskeletal: Negative for back pain and neck pain. Negative for swelling and trauma.; ; Skin: Negative for pruritus, rash, abrasions, blisters, bruising and skin lesion.; ; Neuro: Negative for headache, lightheadedness and neck stiffness. Negative for weakness, altered level of consciousness, altered mental status, extremity weakness, paresthesias, involuntary movement, seizure and syncope.       Physical Exam Updated Vital Signs BP (!) 147/82 (BP Location: Left Arm)   Pulse 90   Temp 98.3 F (36.8 C) (Oral)   Resp 18   Ht 5\' 6"  (1.676 m)   Wt 108 kg (238 lb 3.2 oz)   SpO2 99%   BMI 38.45 kg/m   Physical Exam 1015: Physical  examination:  Nursing notes reviewed; Vital signs and O2 SAT reviewed;  Constitutional: Well developed, Well nourished, Well hydrated, In no acute distress; Head:  Normocephalic, atraumatic; Eyes: EOMI, PERRL, No scleral icterus; ENMT: Mouth and pharynx normal, Mucous membranes moist; Neck: Supple, Full range of motion, No lymphadenopathy; Cardiovascular: Regular rate and rhythm, No gallop; Respiratory: Breath sounds clear & equal bilaterally, No wheezes.  Speaking full sentences with ease, Normal respiratory effort/excursion; Chest: Nontender, Movement normal; Abdomen: Soft, Nontender, Nondistended, Normal bowel sounds; Genitourinary: No CVA tenderness; Extremities: Peripheral pulses normal, No tenderness, +2 pedal edema bilat. No calf asymmetry.; Neuro: AA&Ox3, Major CN grossly intact.  Speech clear. No gross focal motor or sensory deficits in extremities.; Skin: Color normal, Warm, Dry.   ED Treatments / Results  Labs (all labs ordered are listed, but only abnormal results are displayed)   EKG EKG Interpretation  Date/Time:  Friday Sep 08 2017 10:10:02 EDT Ventricular Rate:  85 PR Interval:    QRS Duration: 99 QT Interval:  366 QTC Calculation: 436 R Axis:   39 Text Interpretation:  Sinus rhythm Low voltage, extremity leads When compared with ECG of 08/25/2017 No significant change was found Confirmed by Francine Graven [id number]) on 09/08/2017 10:32:03 AM   Radiology   Procedures Procedures (including critical care time)  Medications Ordered in ED Medications  calcium gluconate 1 g in sodium chloride 0.9 % 100 mL IVPB (has no administration in time range)  dextrose 50 % solution 50 mL (has no administration in time range)  insulin aspart (novoLOG) injection 10 Units (has no administration in time range)  albuterol (PROVENTIL,VENTOLIN) solution continuous neb (has no administration in time range)  sodium polystyrene (KAYEXALATE) 15 GM/60ML suspension 30 g (has no administration in  time range)  0.9 %  sodium chloride infusion (has no administration in time range)  sodium chloride 0.9 % bolus 500 mL (has no administration in time range)     Initial Impression / Assessment and Plan / ED Course  I have reviewed the triage vital signs and the nursing notes.  Pertinent labs & imaging results that were available during my care of the patient were reviewed by me and considered in my medical decision making (see chart for details).   MDM Reviewed: previous chart, nursing note and vitals Reviewed previous: labs and ECG Interpretation: labs and ECG Total time providing critical care: 30-74 minutes. This excludes time spent performing separately reportable procedures and services. Consults: Nephrology and Admitting MD.   CRITICAL CARE Performed by: Alfonzo Feller Total critical care time: 35 minutes Critical care time was exclusive of separately billable procedures and treating other patients. Critical care was necessary to treat or prevent imminent or life-threatening deterioration. Critical care was time spent personally by me on the following activities: development of treatment  plan with patient and/or surrogate as well as nursing, discussions with consultants, evaluation of patient's response to treatment, examination of patient, obtaining history from patient or surrogate, ordering and performing treatments and interventions, ordering and review of laboratory studies, ordering and review of radiographic studies, pulse oximetry and re-evaluation of patient's condition.   Results for orders placed or performed during the hospital encounter of 09/08/17  CBC with Differential  Result Value Ref Range   WBC 5.4 4.0 - 10.5 K/uL   RBC 4.21 (L) 4.22 - 5.81 MIL/uL   Hemoglobin 11.0 (L) 13.0 - 17.0 g/dL   HCT 34.5 (L) 39.0 - 52.0 %   MCV 81.9 78.0 - 100.0 fL   MCH 26.1 26.0 - 34.0 pg   MCHC 31.9 30.0 - 36.0 g/dL   RDW 14.1 11.5 - 15.5 %   Platelets 128 (L) 150 - 400  K/uL   Neutrophils Relative % 70 %   Neutro Abs 3.8 1.7 - 7.7 K/uL   Lymphocytes Relative 20 %   Lymphs Abs 1.1 0.7 - 4.0 K/uL   Monocytes Relative 8 %   Monocytes Absolute 0.4 0.1 - 1.0 K/uL   Eosinophils Relative 2 %   Eosinophils Absolute 0.1 0.0 - 0.7 K/uL   Basophils Relative 0 %   Basophils Absolute 0.0 0.0 - 0.1 K/uL  Basic metabolic panel  Result Value Ref Range   Sodium 137 135 - 145 mmol/L   Potassium 6.5 (HH) 3.5 - 5.1 mmol/L   Chloride 106 101 - 111 mmol/L   CO2 24 22 - 32 mmol/L   Glucose, Bld 160 (H) 65 - 99 mg/dL   BUN 75 (H) 6 - 20 mg/dL   Creatinine, Ser 2.64 (H) 0.61 - 1.24 mg/dL   Calcium 9.3 8.9 - 10.3 mg/dL   GFR calc non Af Amer 27 (L) >60 mL/min   GFR calc Af Amer 32 (L) >60 mL/min   Anion gap 7 5 - 15   Results for Kevin Meyer, Kevin Meyer (MRN [id number]) as of 09/08/2017 11:45  Ref. Range 08/25/2017 14:46 08/25/2017 18:34 08/25/2017 21:55 08/26/2017 06:32 09/08/2017 10:15  BUN Latest Ref Range: 6 - 20 mg/dL 60 (H) 58 (H) 57 (H) 50 (H) 75 (H)  Creatinine Latest Ref Range: 0.61 - 1.24 mg/dL 2.37 (H) 2.24 (H) 2.20 (H) 2.08 (H) 2.64 (H)   Results for Kevin Meyer, Kevin Meyer (MRN [id number]) as of 09/08/2017 11:45  Ref. Range 08/25/2017 14:46 08/25/2017 18:34 08/25/2017 21:55 08/26/2017 06:32 09/08/2017 10:15  Potassium Latest Ref Range: 3.5 - 5.1 mmol/L 5.9 (H) 6.2 (H) 5.6 (H) 4.7 6.5 (HH)    1100:  Potassium elevated and usual tx ordered: IVF, IV calcium for peaked T-waves on EKG, IV insulin/D50 and albuterol neb.  T/C returned from Renal Dr. Lowanda Foster, case discussed, including:  HPI, pertinent PM/SHx, VS/PE, dx testing, ED course and treatment:  Agrees with ED treatment, also requests to add kayexalate 30gm now and repeat in 4 hours as well as serial/repeat potassium checks, admit overnight to [facility] to assure potassium has been corrected, pt can f/u ofc for CKD.   1230:  T/C returned from [facility] Dr. Dyann Kief, case discussed, including:  HPI, pertinent PM/SHx, VS/PE, dx testing, ED course and  treatment:  Agreeable to admit.     Final Clinical Impressions(s) / ED Diagnoses   Final diagnoses:  None    ED Discharge Orders    None       Francine Graven, DO 09/10/17 2135

## 2017-09-08 NOTE — ED Triage Notes (Signed)
Pt reports he had blood work [facility] as part of his routine check up and was called because his potassium was 6.4  Pt says has diabetes and kidney disease.  No dialysis.

## 2017-09-08 NOTE — H&P (Signed)
History and Physical    Kevin Meyer ZGY:[id number] DOB: 1971-01-24 DOA: 09/08/2017  PCP: [facility]   I have briefly reviewed patients previous medical reports in [facility].  Patient coming from: Home  Chief Complaint: Abnormal labs (elevated potassium).  HPI: Kevin Meyer is a 47 year old male with a past medical history significant for hypertension, chronic kidney disease stage III, type 2 diabetes mellitus (no chronically on insulin), hyperlipidemia, knee osteoarthritis and a recent admission approximately 2.5 weeks ago secondary to hyperkalemia.  Who reported pending his usual state of health and no having any acute complaints; he visited his PCP as part of previous hospitalization follow-up and was instructed to return to the hospital secondary to abnormal blood work.  At that time his potassium was 6.4.  He denies any chest pain, fever, shortness of breath, nausea, vomiting, hematuria, dysuria, melena, hematochezia, headaches or any focal weakness. Of note she reported not drinking as much water as previously instructed but other than that has been compliant with his medication regimen and is no longer taking ACE inhibitors.  ED Course: Blood work demonstrated potassium of 6.5 and also worsening of his renal function with elevated BUN and creatinine.  Gentle IV fluid bolus, calcium gluconate, insulin and Kayexalate given while in the ED.  EKG demonstrating peaked T waves. TRH called to place patient in observation for further management of his abnormal electrolytes.  Nephrology was also curbside and recommended lowering his potassium level and once a stable arrange outpatient follow-up.  Review of Systems:  All other systems reviewed and apart from HPI, are negative.  Past Medical History:  Diagnosis Date  . Diabetes mellitus without complication ([place])   . High cholesterol   . Hypertension   . Renal disorder    Past surgical history: Reviewed with patient  and no pertinent surgical procedures has been done.   Social History  reports that he has never smoked. He has never used smokeless tobacco. He reports that he does not drink alcohol or use drugs.  Allergies  Allergen Reactions  . Gabapentin Anaphylaxis  . Lyrica [Pregabalin] Anaphylaxis    Family History  Problem Relation Age of Onset  . Stroke Mother   . Diabetes Other     Prior to Admission medications   Medication Sig Start Date End Date Taking? Authorizing Provider  amLODipine (NORVASC) 10 MG tablet Take 10 mg by mouth daily.   Yes [provider]  aspirin EC 81 MG tablet Take 81 mg by mouth daily.   Yes [provider]  furosemide (LASIX) 40 MG tablet Take 20-40 mg by mouth 2 (two) times daily. Patient take 40mg  in the morning and 20mg  at 1200pm   Yes [provider]  glipiZIDE (GLUCOTROL) 10 MG tablet Take 1 tablet (10 mg total) by mouth 2 (two) times daily before a meal. 08/26/17  Yes Barton Dubois, MD  labetalol (NORMODYNE) 100 MG tablet Take 100 mg by mouth 2 (two) times daily.   Yes [provider]  lovastatin (MEVACOR) 20 MG tablet Take 20 mg by mouth at bedtime.   Yes [provider]    Physical Exam: Vitals:   09/08/17 1245 09/08/17 1415 09/08/17 1509 09/08/17 1550  BP:   127/66 (!) 141/82  Pulse: 87 86 92 95  Resp: 14 13 20 20   Temp:    98.6 F (37 C)  TempSrc:    Oral  SpO2: 100% 97% 100% 99%  Weight:    48.8 kg (107 lb  8 oz)  Height:    5\' 6"  (1.676 m)    Constitutional: Afebrile, in no distress, denies chest pain, no shortness of breath.  He reported feeling okay and being here secondary to abnormal labs found at his PCP office. Eyes: PERTLA, lids and conjunctivae normal, no icterus, no nystagmus. ENMT: Mucous membranes were dry on exam. Posterior pharynx clear of any exudate or lesions. Normal dentition.  Neck: supple, no masses, no thyromegaly, no JVD Respiratory: clear to auscultation bilaterally, no  wheezing, no crackles. Normal respiratory effort. No accessory muscle use.  Cardiovascular: S1 & S2 heard, regular rate and rhythm, no murmurs / rubs / gallops. No extremity edema. 2+ pedal pulses. No carotid bruits.  Abdomen: No distension, no tenderness, no masses palpated. No hepatosplenomegaly. Bowel sounds normal.  Musculoskeletal: no clubbing / cyanosis. No joint deformity upper and lower extremities. Good ROM, no contractures. Normal muscle tone.  Skin: no rashes, lesions, ulcers. No induration Neurologic: CN 2-12 grossly intact. Sensation intact, DTR normal. Strength 5/5 in all 4 limbs.  Psychiatric: Normal judgment and insight. Alert and oriented x 3. Normal mood.     Labs on Admission: I have personally reviewed following labs and imaging studies  CBC: Recent Labs  Lab 09/08/17 1015  WBC 5.4  NEUTROABS 3.8  HGB 11.0*  HCT 34.5*  MCV 81.9  PLT 409*   Basic Metabolic Panel: Recent Labs  Lab 09/08/17 1015  NA 137  K 6.5*  CL 106  CO2 24  GLUCOSE 160*  BUN 75*  CREATININE 2.64*  CALCIUM 9.3   CBG: Recent Labs  Lab 09/08/17 1655  GLUCAP 126*    Radiological Exams on Admission: No results found.  EKG: Independently reviewed.  Normal axis, peaked T waves appreciated on EKG.  No ischemic changes.  Assessment/Plan 1-hyperkalemia: -In the setting of worsening renal failure -Patient no longer taking ACE inhibitors or ARB -According to him not taking any other medications other than the ones prescribed (this includes multivitamins over-the-counter). -He will receive insulin, calcium gluconate and Kayexalate while in the ED -He will be placed on for the insulin as part of the treatment for his diabetes and will provide gentle fluid resuscitation -Follow potassium level -Continue telemetry monitoring -Follow renal service recommendations.  2-Hypertension: -Continue amlodipine  3-acute on chronic renal failure superimposed on stage III kidney disease at  baseline.   -Will provide fluid resuscitation -Minimize nephrotoxic agents -Follow renal function trend -Patient denies dysuria -Most likely associated with prerenal azotemia from dehydration.  4-Class 2 obesity due to excess calories with body mass index (BMI) of 38.2 -Body mass index is 38.25 kg/m. -Low calorie diet, portion control and increase physical activity has been discussed with patient.  5-type 2 diabetes with nephropathy -Holding oral hypoglycemic agents while inpatient -Modified carbohydrate diet ordered -Will check hemoglobin A1c (according to patient last one was 6.3 but has been over 3 months now and we do not have any A1c in our records). -Will use sliding scale insulin  6-Hyperlipidemia -Continue statins  7-osteoarthritis of his knees -Continue to use Tylenol for pain control -NSAIDs to be avoided especially with worsening renal function.  Time: 60 minutes.   DVT prophylaxis: Heparin Code Status: Full code Family Communication: No family at bedside Disposition Plan: Anticipate discharge back home once electrolytes abnormalities are corrected. Consults called: Nephrology service Admission status: Observation, LOS less than 2 midnight; telemetry bed   Barton Dubois MD [facility] Pager [phone]  if 7PM-7AM, please contact night-coverage [email or web address] Password  [email or web address]  09/08/2017, 5:36 PM

## 2017-09-08 NOTE — ED Notes (Signed)
CRITICAL VALUE ALERT  Critical Value:  Potassium 6.5  Date & Time Notied:  09/08/2017 @ 9417  Provider Notified: [place] Orders Received/Actions taken: consult nephrology

## 2017-09-09 LAB — BASIC METABOLIC PANEL
Anion gap: 8 (ref 5–15)
BUN: 62 mg/dL — AB (ref 6–20)
CHLORIDE: 106 mmol/L (ref 101–111)
CO2: 22 mmol/L (ref 22–32)
Calcium: 8.6 mg/dL — ABNORMAL LOW (ref 8.9–10.3)
Creatinine, Ser: 2.15 mg/dL — ABNORMAL HIGH (ref 0.61–1.24)
GFR calc Af Amer: 41 mL/min — ABNORMAL LOW (ref >60)
GFR calc non Af Amer: 35 mL/min — ABNORMAL LOW (ref >60)
GLUCOSE: 82 mg/dL (ref 65–99)
Potassium: 5 mmol/L (ref 3.5–5.1)
SODIUM: 136 mmol/L (ref 135–145)

## 2017-09-09 LAB — RENAL FUNCTION PANEL
Albumin: 4.3 g/dL (ref 3.5–5.0)
Anion gap: 7 (ref 5–15)
BUN: 56 mg/dL — AB (ref 6–20)
CHLORIDE: 105 mmol/L (ref 101–111)
CO2: 24 mmol/L (ref 22–32)
CREATININE: 2.06 mg/dL — AB (ref 0.61–1.24)
Calcium: 9 mg/dL (ref 8.9–10.3)
GFR calc Af Amer: 43 mL/min — ABNORMAL LOW (ref >60)
GFR, EST NON AFRICAN AMERICAN: 37 mL/min — AB (ref >60)
Glucose, Bld: 136 mg/dL — ABNORMAL HIGH (ref 65–99)
POTASSIUM: 5.5 mmol/L — AB (ref 3.5–5.1)
Phosphorus: 4.3 mg/dL (ref 2.5–4.6)
Sodium: 136 mmol/L (ref 135–145)

## 2017-09-09 LAB — GLUCOSE, CAPILLARY
GLUCOSE-CAPILLARY: 143 mg/dL — AB (ref 65–99)
GLUCOSE-CAPILLARY: 63 mg/dL — AB (ref 65–99)
Glucose-Capillary: 97 mg/dL (ref 65–99)
Glucose-Capillary: 110 mg/dL — ABNORMAL HIGH (ref 65–99)
Glucose-Capillary: 62 mg/dL — ABNORMAL LOW (ref 65–99)
Glucose-Capillary: 130 mg/dL — ABNORMAL HIGH (ref 65–99)

## 2017-09-09 LAB — URINALYSIS, ROUTINE W REFLEX MICROSCOPIC
BACTERIA UA: NONE SEEN
BILIRUBIN URINE: NEGATIVE
Glucose, UA: NEGATIVE mg/dL
Ketones, ur: NEGATIVE mg/dL
Leukocytes, UA: NEGATIVE
Nitrite: NEGATIVE
PROTEIN: 100 mg/dL — AB
Specific Gravity, Urine: 1.012 (ref 1.005–1.030)
pH: 5 (ref 5.0–8.0)

## 2017-09-09 MED ORDER — SODIUM POLYSTYRENE SULFONATE 15 GM/60ML PO SUSP: 30.0000 g | Freq: Once | ORAL | Status: DC

## 2017-09-09 MED ORDER — DEXTROSE 50 % IV SOLN
25.0000 mL | Freq: Once | INTRAVENOUS | Status: AC
  Administered 2017-09-09: 25 mL via INTRAVENOUS

## 2017-09-09 MED ORDER — POLYETHYLENE GLYCOL 3350 17 G PO PACK
17.0000 g | PACK | Freq: Every day | ORAL | Status: AC
  Administered 2017-09-09 – 2017-09-10 (×2): 17 g via ORAL
  Filled 2017-09-09 (×2): qty 1

## 2017-09-09 MED ORDER — SODIUM CHLORIDE 0.9 % IV SOLN
INTRAVENOUS | Status: AC
  Administered 2017-09-09 – 2017-09-10 (×2): via INTRAVENOUS

## 2017-09-09 MED ORDER — DEXTROSE 50 % IV SOLN
INTRAVENOUS | Status: AC
  Filled 2017-09-09: qty 50

## 2017-09-09 MED ORDER — SODIUM POLYSTYRENE SULFONATE 15 GM/60ML PO SUSP
30.0000 g | Freq: Once | ORAL | Status: AC
  Administered 2017-09-09: 30 g via ORAL
  Filled 2017-09-09: qty 120

## 2017-09-09 MED ORDER — INSULIN ASPART 100 UNIT/ML IV SOLN
10.0000 [IU] | Freq: Once | INTRAVENOUS | Status: AC
  Administered 2017-09-09: 10 [IU] via INTRAVENOUS

## 2017-09-09 MED ORDER — FUROSEMIDE 20 MG PO TABS
20.0000 mg | ORAL_TABLET | Freq: Two times a day (BID) | ORAL | Status: DC
  Administered 2017-09-09 – 2017-09-10 (×2): 20 mg via ORAL
  Filled 2017-09-09 (×2): qty 1

## 2017-09-09 MED ORDER — TRAZODONE HCL 50 MG PO TABS: 100.0000 mg | ORAL_TABLET | Freq: Every evening | ORAL | Status: DC | PRN

## 2017-09-09 NOTE — Progress Notes (Signed)
[facility] PROGRESS NOTE  Kevin Meyer BSJ:[id number] DOB: 06/18/1970 DOA: 09/08/2017 PCP: [facility], [facility]  Interim summary and history of present illness: 47 year old male with a past medical history significant for hypertension, chronic kidney disease stage III, type 2 diabetes mellitus (no chronically on insulin), hyperlipidemia, knee osteoarthritis and a recent admission approximately 2.5 weeks ago secondary to hyperkalemia.  Who reported pending his usual state of health and no having any acute complaints; he visited his PCP as part of previous hospitalization follow-up and was instructed to return to the hospital secondary to abnormal blood work.  At that time his potassium was 6.4.  He denies any chest pain, fever, shortness of breath, nausea, vomiting, hematuria, dysuria, melena, hematochezia, headaches or any focal weakness.  Assessment/Plan: 1-hyperkalemia: In the setting of acute on chronic renal failure. -Continue gentle IV fluids -Kayexalate dose has been repeated today -will also give insulin to reduce potassium level and MiraLAX to try to assist moving his bowels. -Continue monitoring on telemetry -Repeat renal panel in a.m.  2-acute on chronic renal failure: Stage III at baseline -Creatinine has come down with IV fluids -will resume Lasix 20 mg twice daily as per patient request (30 afraid of developing swelling or fluid retention). -Advised to keep good hydration by mouth and will reduce IV fluids at this time as per his request. -continue minimizing the use of nephrotoxic agents.  3-hypertension -Stable -Continue amlodipine  4-class II obesity due to excess calories with a BMI of 38.2 -Low calorie diet, portion control and increase physical activity has been discussed with patient -He has expressed that he is already working with days to continue losing weight.  5-type 2 diabetes with nephropathy: -A1c 6.1 -Continue holding hypoglycemic agents  while inpatient -Continue sliding scale insulin.  6-osteoarthritis of his knees -Continue Tylenol for pain control.  Code Status: Full code Family Communication: No family at bedside. Disposition Plan: Most likely home in a.m.  Reduce IV fluid as per patient request, resume Lasix 20 mg every 12 hours only for now.  Provide MiraLAX and insulin to assist with control of his potassium level.  After the last dose of Kayexalate he had no move his bowels yet.   Consultants:  Nephrology service.  Procedures:  None  Antibiotics:  None  HPI/Subjective: No chest pain, no shortness of breath, no nausea vomiting.  Patient is otherwise feeling good and just frustrated with the fact that his potassium continue going up.  Objective: Vitals:   09/09/17 1012 09/09/17 1451  BP: (!) 144/79 (!) 148/90  Pulse: 98 95  Resp: 18 18  Temp:  98.6 F (37 C)  SpO2: 99% 98%    Intake/Output Summary (Last 24 hours) at 09/09/2017 1715 Last data filed at 09/09/2017 1300 Gross per 24 hour  Intake 2055.92 ml  Output -  Net 2055.92 ml   Filed Weights   09/08/17 0959 09/08/17 1550  Weight: 108 kg (238 lb 3.2 oz) 107.5 kg (236 lb 15.9 oz)    Exam:   General: Fever, no chest pain, no nausea vomiting.  Feeling good.  Cardiovascular: S1 and S2, no rubs, no gallops, no JVD.  Respiratory: Clear to auscultation bilaterally, good oxygen saturation on room air.  No using accessory muscles.  Abdomen: Soft, nontender, nondistended, positive bowel sounds.  He  Musculoskeletal: Trace edema bilaterally, no cyanosis, no clubbing.  Data Reviewed: Basic Metabolic Panel: Recent Labs  Lab 09/08/17 1015 09/08/17 1816 09/09/17 0556 09/09/17 1437  NA 137 135 136 136  K 6.5* 5.9* 5.0 5.5*  CL 106 105 106 105  CO2 24 22 22 24   GLUCOSE 160* 202* 82 136*  BUN 75* 69* 62* 56*  CREATININE 2.64* 2.53* 2.15* 2.06*  CALCIUM 9.3 8.6* 8.6* 9.0  MG  --  2.4  --   --   PHOS  --  5.1*  --  4.3   Liver  Function Tests: Recent Labs  Lab 09/09/17 1437  ALBUMIN 4.3   CBC: Recent Labs  Lab 09/08/17 1015  WBC 5.4  NEUTROABS 3.8  HGB 11.0*  HCT 34.5*  MCV 81.9  PLT 128*   CBG: Recent Labs  Lab 09/08/17 1655 09/08/17 2127 09/09/17 0804 09/09/17 1150 09/09/17 1635  GLUCAP 126* 93 97 143* 110*    Studies: No results found.  Scheduled Meds: . amLODipine  10 mg Oral Daily  . aspirin EC  81 mg Oral Daily  . dextrose  25 mL Intravenous Once  . furosemide  20 mg Oral BID  . heparin injection (subcutaneous)  5,000 Units Subcutaneous Q8H  . insulin aspart  0-5 Units Subcutaneous QHS  . insulin aspart  0-9 Units Subcutaneous TID WC  . insulin aspart  10 Units Intravenous Once  . labetalol  100 mg Oral BID  . polyethylene glycol  17 g Oral Daily  . pravastatin  20 mg Oral q1800  . sodium chloride flush  3 mL Intravenous Q12H   Continuous Infusions: . sodium chloride      Time spent: 30 minutes   [place] [facility] Pager [phone]. If 7PM-7AM, please contact night-coverage at [email or web address], password [facility] 09/09/2017, 5:15 PM  LOS: 0 days

## 2017-09-09 NOTE — Consult Note (Signed)
Reason for Consult: Renal failure and hyperkalemia Referring Physician: Dr. [name] is an 47 y.o. male.  HPI: He is a patient who has history of diabetes and hypertension since 2014 presently was sent from health clinic after patient was found to have hyperkalemia and worsening of renal failure.  Patient is said that he was here a couple of weeks ago with similar issue.  He states that he was told about his kidney problem the last couple of months.  Presently denies any nausea or vomiting.  Denies also any difficulty breathing.  Patient does not have any history of kidney stone.  Past Medical History:  Diagnosis Date  . Diabetes mellitus without complication ([facility])   . High cholesterol   . Hypertension   . Renal disorder     History reviewed. No pertinent surgical history.  Family History  Problem Relation Age of Onset  . Stroke Mother   . Diabetes Other     Social History:  reports that he has never smoked. He has never used smokeless tobacco. He reports that he does not drink alcohol or use drugs.  Allergies:  Allergies  Allergen Reactions  . Gabapentin Anaphylaxis  . Lyrica [Pregabalin] Anaphylaxis    Medications: I have reviewed the patient's current medications.  Results for orders placed or performed during the hospital encounter of 09/08/17 (from the past 48 hour(s))  CBC with Differential     Status: Abnormal   Collection Time: 09/08/17 10:15 AM  Result Value Ref Range   WBC 5.4 4.0 - 10.5 K/uL   RBC 4.21 (L) 4.22 - 5.81 MIL/uL   Hemoglobin 11.0 (L) 13.0 - 17.0 g/dL   HCT 34.5 (L) 39.0 - 52.0 %   MCV 81.9 78.0 - 100.0 fL   MCH 26.1 26.0 - 34.0 pg   MCHC 31.9 30.0 - 36.0 g/dL   RDW 14.1 11.5 - 15.5 %   Platelets 128 (L) 150 - 400 K/uL   Neutrophils Relative % 70 %   Neutro Abs 3.8 1.7 - 7.7 K/uL   Lymphocytes Relative 20 %   Lymphs Abs 1.1 0.7 - 4.0 K/uL   Monocytes Relative 8 %   Monocytes Absolute 0.4 0.1 - 1.0 K/uL   Eosinophils Relative 2 %    Eosinophils Absolute 0.1 0.0 - 0.7 K/uL   Basophils Relative 0 %   Basophils Absolute 0.0 0.0 - 0.1 K/uL    Comment: Performed at [facility], [address] [place], [place] [address]  Basic metabolic panel     Status: Abnormal   Collection Time: 09/08/17 10:15 AM  Result Value Ref Range   Sodium 137 135 - 145 mmol/L   Potassium 6.5 (HH) 3.5 - 5.1 mmol/L    Comment: CRITICAL RESULT CALLED TO, READ BACK BY AND VERIFIED WITH: BETHEL,S AT 10:45AM ON 09/08/17 BY FESTERMAN,C    Chloride 106 101 - 111 mmol/L   CO2 24 22 - 32 mmol/L   Glucose, Bld 160 (H) 65 - 99 mg/dL   BUN 75 (H) 6 - 20 mg/dL   Creatinine, Ser 2.64 (H) 0.61 - 1.24 mg/dL   Calcium 9.3 8.9 - 10.3 mg/dL   GFR calc non Af Amer 27 (L) >60 mL/min   GFR calc Af Amer 32 (L) >60 mL/min    Comment: (NOTE) The eGFR has been calculated using the CKD EPI equation. This calculation has not been validated in all clinical situations. eGFR's persistently <60 mL/min signify possible Chronic Kidney Disease.  Anion gap 7 5 - 15    Comment: Performed at [facility], [address], [place], [place] [address]  Hemoglobin A1c     Status: Abnormal   Collection Time: 09/08/17 10:16 AM  Result Value Ref Range   Hgb A1c MFr Bld 6.1 (H) 4.8 - 5.6 %    Comment: (NOTE) Pre diabetes:          5.7%-6.4% Diabetes:              >6.4% Glycemic control for   <7.0% adults with diabetes    Mean Plasma Glucose 128.37 mg/dL    Comment: Performed at [facility] [address], [place], [place] [address]  Glucose, capillary     Status: Abnormal   Collection Time: 09/08/17  4:55 PM  Result Value Ref Range   Glucose-Capillary 126 (H) 65 - 99 mg/dL   Comment 1 Notify RN    Comment 2 Document in Chart   Magnesium     Status: None   Collection Time: 09/08/17  6:16 PM  Result Value Ref Range   Magnesium 2.4 1.7 - 2.4 mg/dL    Comment: Performed at [facility], [address], [place], [place] [address]  Phosphorus     Status: Abnormal    Collection Time: 09/08/17  6:16 PM  Result Value Ref Range   Phosphorus 5.1 (H) 2.5 - 4.6 mg/dL    Comment: Performed at [facility], [address] [place], [place] [address]  Basic metabolic panel     Status: Abnormal   Collection Time: 09/08/17  6:16 PM  Result Value Ref Range   Sodium 135 135 - 145 mmol/L   Potassium 5.9 (H) 3.5 - 5.1 mmol/L   Chloride 105 101 - 111 mmol/L   CO2 22 22 - 32 mmol/L   Glucose, Bld 202 (H) 65 - 99 mg/dL   BUN 69 (H) 6 - 20 mg/dL   Creatinine, Ser 2.53 (H) 0.61 - 1.24 mg/dL   Calcium 8.6 (L) 8.9 - 10.3 mg/dL   GFR calc non Af Amer 29 (L) >60 mL/min   GFR calc Af Amer 33 (L) >60 mL/min    Comment: (NOTE) The eGFR has been calculated using the CKD EPI equation. This calculation has not been validated in all clinical situations. eGFR's persistently <60 mL/min signify possible Chronic Kidney Disease.    Anion gap 8 5 - 15    Comment: Performed at [facility], [address], [place], [place] [address]  Glucose, capillary     Status: None   Collection Time: 09/08/17  9:27 PM  Result Value Ref Range   Glucose-Capillary 93 65 - 99 mg/dL   Comment 1 Notify RN    Comment 2 Document in Chart   Basic metabolic panel     Status: Abnormal   Collection Time: 09/09/17  5:56 AM  Result Value Ref Range   Sodium 136 135 - 145 mmol/L   Potassium 5.0 3.5 - 5.1 mmol/L   Chloride 106 101 - 111 mmol/L   CO2 22 22 - 32 mmol/L   Glucose, Bld 82 65 - 99 mg/dL   BUN 62 (H) 6 - 20 mg/dL   Creatinine, Ser 2.15 (H) 0.61 - 1.24 mg/dL   Calcium 8.6 (L) 8.9 - 10.3 mg/dL   GFR calc non Af Amer 35 (L) >60 mL/min   GFR calc Af Amer 41 (L) >60 mL/min    Comment: (NOTE) The eGFR has been calculated using the CKD  EPI equation. This calculation has not been validated in all clinical situations. eGFR's persistently <60 mL/min signify possible Chronic Kidney Disease.    Anion gap 8 5 - 15    Comment: Performed at [facility], [address] [place], [place] [address]   Glucose, capillary     Status: None   Collection Time: 09/09/17  8:04 AM  Result Value Ref Range   Glucose-Capillary 97 65 - 99 mg/dL    No results found.  Review of Systems  Constitutional: Negative for malaise/fatigue.  Respiratory: Negative for shortness of breath.   Cardiovascular: Negative for orthopnea and leg swelling.  Gastrointestinal: Negative for diarrhea, nausea and vomiting.  Genitourinary: Negative for frequency and urgency.   Blood pressure (!) 152/85, pulse 94, temperature 98.6 F (37 C), temperature source Oral, resp. rate 17, height '5\' 6"'  (1.676 m), weight 107.5 kg (236 lb 15.9 oz), SpO2 98 %. Physical Exam  Constitutional: He is oriented to person, place, and time. No distress.  Neck: No JVD present.  Cardiovascular: Normal rate and regular rhythm.  Respiratory: No respiratory distress. He has no wheezes. He has no rales.  GI: He exhibits no distension. There is no tenderness.  Musculoskeletal: He exhibits no edema.  Neurological: He is alert and oriented to person, place, and time.    Assessment/Plan: 1] renal failure: Possibly acute on chronic.  He has ultrasound of the kidneys showed right kidney to be 12 cm and left kidney 13.7.  The underlying etiology for his renal failure could be secondary to diabetes/hypertension.  Presently superimposed prerenal syndrome cannot be ruled out.  His creatinine is somewhat better this morning.  Patient does not have any uremic signs and symptoms. 2] hyperkalemia: This could be from high potassium intake/renal failure/type IV RTA.  Patient has received Kayexalate and his potassium is normal.  This is a recurrent issue. 3] hypertension: His blood pressure is reasonably controlled 4] diabetes: Patient denies any polyuria or polydipsia.  Patient does state that he has diabetes since 2014 when he found out when they did the blood work. 5] history of degenerative joint disease: Denies taking nonsteroidal. 6] anemia 7]  proteinuria: Nonnephrotic range Plan: Agree with hydration and will increase IV fluid to 125 cc/h 2] we will put patient on low potassium diet 3] we will check ANA, complement, hepatitis B surface antigen, hepatitis C antibody 4] we will check his renal panel in the morning. Sriya Kroeze S 09/09/2017, 8:32 AM

## 2017-09-10 LAB — HEPATITIS B SURFACE ANTIGEN: Hepatitis B Surface Ag: NEGATIVE

## 2017-09-10 LAB — C3 COMPLEMENT: C3 Complement: 126 mg/dL (ref 82–167)

## 2017-09-10 LAB — C4 COMPLEMENT: COMPLEMENT C4, BODY FLUID: 29 mg/dL (ref 14–44)

## 2017-09-10 LAB — GLUCOSE, CAPILLARY: GLUCOSE-CAPILLARY: 130 mg/dL — AB (ref 65–99)

## 2017-09-10 LAB — RENAL FUNCTION PANEL
Albumin: 3.8 g/dL (ref 3.5–5.0)
Anion gap: 6 (ref 5–15)
BUN: 49 mg/dL — AB (ref 6–20)
CALCIUM: 8.7 mg/dL — AB (ref 8.9–10.3)
CHLORIDE: 107 mmol/L (ref 101–111)
CO2: 25 mmol/L (ref 22–32)
Creatinine, Ser: 1.81 mg/dL — ABNORMAL HIGH (ref 0.61–1.24)
GFR, EST AFRICAN AMERICAN: 50 mL/min — AB (ref >60)
GFR, EST NON AFRICAN AMERICAN: 43 mL/min — AB (ref >60)
Glucose, Bld: 150 mg/dL — ABNORMAL HIGH (ref 65–99)
POTASSIUM: 4.7 mmol/L (ref 3.5–5.1)
Phosphorus: 4.3 mg/dL (ref 2.5–4.6)
Sodium: 138 mmol/L (ref 135–145)

## 2017-09-10 LAB — HEPATITIS C ANTIBODY: HCV Ab: <0.1 s/co ratio (ref 0.0–0.9)

## 2017-09-10 MED ORDER — LABETALOL HCL 100 MG PO TABS: 200.0000 mg | ORAL_TABLET | Freq: Two times a day (BID) | ORAL | 1 refills | Status: DC

## 2017-09-10 MED ORDER — FUROSEMIDE 40 MG PO TABS: 20.0000 mg | ORAL_TABLET | Freq: Two times a day (BID) | ORAL | Status: DC

## 2017-09-10 NOTE — Progress Notes (Signed)
Kevin Meyer  MRN: [id number]  DOB/AGE: 47-Jul-1972 47 y.o.  Primary Care Physician:[facility] date: 0/38/3338  Chief Complaint:  Chief Complaint  Patient presents with  . hyperkalemia    S-Pt presented on  09/08/2017 with  Chief Complaint  Patient presents with  . hyperkalemia  .    Pt today feels better  Meds      Physical Exam: Vital signs in last 24 hours:   Weight change:  Last BM Date: 09/09/17  Intake/Output from previous day: 05/19 0701 - 05/20 0700 In: 560 [P.O.:560] Out: -  Total I/O In: 3291 [P.O.:920; I.V.:653] Out: -    Physical Exam: General- pt is awake,alert, oriented to time place and person Resp- No acute REsp distress, CTA B/L NO Rhonchi CVS- S1S2 regular ij rate and rhythm GIT- BS+, soft, NT, ND EXT- NO LE Edema, Cyanosis   Lab Results: CBC No results for input(s): WBC, HGB, HCT, PLT in the last 72 hours.  BMET Recent Labs    09/09/17 1437 09/10/17 0449  NA 136 138  K 5.5* 4.7  CL 105 107  CO2 24 25  GLUCOSE 136* 150*  BUN 56* 49*  CREATININE 2.06* 1.81*  CALCIUM 9.0 8.7*    Creat trend 2019 2.6=> 2.0=>1.8   Lab Results  Component Value Date   CALCIUM 8.7 (L) 09/10/2017   PHOS 4.3 09/10/2017               Impression: 1)Renal  AKI secondary to Prerenal/ATN               AKI on CKD               CKD stage 3?.               CKD since ?               CKD secondary to DM/HTN                Progression of CKD marked with AKI                Proteinura will check.   2)HTN   Medication- On Diuretics. On Calcium Channel Blockers On Beta blockers On Alpha and beta Blockers.  3)Anemia HGb at goal (9--11)   4)CKD Mineral-Bone Disorder  Phosphorus at goal. Calcium is at goal.  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

## 2017-09-10 NOTE — Progress Notes (Signed)
Pt discharged home today per Dr. Dyann Kief. Pt's IV site D/C'd and WDL. Pt's VSS. Pt provided with home medication list, discharge instructions and prescriptions. Verbalized understanding. Pt ambulated off floor in stable condition accompanied by RN.

## 2017-09-10 NOTE — Discharge Summary (Signed)
Physician Discharge Summary  Kevin Meyer OEU:[id number] DOB: 05-20-1970 DOA: 09/08/2017  PCP: Sandria Manly [place] date: 1/54/0086 Discharge date: 09/10/2017  Time spent: 35 minutes  Recommendations for Outpatient Follow-up:  1. Reassess blood pressure and further adjust antihypertensive regimen 2. Repeat basic metabolic panel to follow electrolytes and renal function 3. Make sure that the patient has arranged follow-up as instructed with renal service.   Discharge Diagnoses:  Principal Problem:   Hyperkalemia Active Problems:   Hypertension   AKI (acute kidney injury) ([facility])   Acute renal failure superimposed on stage 3 chronic kidney disease ([facility])   Class 2 obesity due to excess calories with body mass index (BMI) of 39.0 to 39.9 in adult   Hyperlipidemia   Discharge Condition: Stable and improved.  Patient instructed to follow-up with PCP and renal service as instructed.  Diet recommendation: Heart healthy, low potassium and modified carbohydrate diet.  Filed Weights   09/08/17 0959 09/08/17 1550  Weight: 108 kg (238 lb 3.2 oz) 107.5 kg (236 lb 15.9 oz)    History of present illness:  47 year old male with a past medical history significant for hypertension, chronic kidney disease stage III, type 2 diabetes mellitus (no chronically on insulin), hyperlipidemia, knee osteoarthritis and a recent admission approximately 2.5 weeks ago secondary to hyperkalemia. Who reported pending his usual state of health and no having any acute complaints; he visited his PCP as part of previous hospitalization follow-up and was instructed to return to the hospital secondary to abnormal blood work. At that time his potassium was 6.4. He denies any chest pain, fever, shortness of breath, nausea, vomiting, hematuria, dysuria, melena, hematochezia, headaches or any focal weakness.   Hospital Course:  1-hyperkalemia: In the setting of acute on chronic renal failure. -Electrolytes  has regulated after the use of Kayexalate, insulin and IV fluids.  Kayexalate dose has been repeated today -Potassium down to 4.7 and is stable. -No changes on telemetry.  2-acute on chronic renal failure: Stage III at baseline -Creatinine has come down with IV fluids -will resume Lasix 20 mg twice daily  -Advised to maintain adequate hydration and to follow low-sodium diet. -continue minimizing the use of nephrotoxic agents. -Repeat basic metabolic panel at follow-up visit to reassess electrolytes function and creatinine trend. -Outpatient follow-up with renal service  3-hypertension -Stable -Continue labetalol and lasix  -Advised to follow a heart healthy diet.  4-class II obesity due to excess calories with a BMI of 38.2 -Low calorie diet, portion control and increase physical activity has been discussed with patient -He has expressed that he is already working with days to continue losing weight.  5-type 2 diabetes with nephropathy: -A1c 6.1 -Advised to continue following modified carbohydrate diet -Will resume home oral hypoglycemic regimen  6-osteoarthritis of his knees -Continue Tylenol for pain control.   Procedures:  None  Consultations:  Nephrology  Discharge Exam: Vitals:   09/09/17 2057 09/10/17 0557  BP: (!) 144/81 (!) 155/90  Pulse: 97 90  Resp: 20 18  Temp: 98.7 F (37.1 C) 98.5 F (36.9 C)  SpO2: 99% 96%     General: Fever, no chest pain, no nausea vomiting.  Feeling good.  Cardiovascular: S1 and S2, no rubs, no gallops, no JVD.  Respiratory: Clear to auscultation bilaterally, good oxygen saturation on room air.  No using accessory muscles.  Abdomen: Soft, nontender, nondistended, positive bowel sounds.  He  Musculoskeletal: Trace edema bilaterally, no cyanosis, no clubbing.   Discharge Instructions   Discharge Instructions  Diet - low sodium heart healthy   Complete by:  As directed    Discharge instructions   Complete by:  As  directed    Keep yourself well-hydrated Follow low-sodium diet Watch the amount of potassium that you ingested and make sure to increase fiber. Follow-up with PCP in 10 days Follow-up with nephrology as instructed (follow-up visit to be arranged in 3 weeks).     Allergies as of 09/10/2017      Reactions   Gabapentin Anaphylaxis   Lyrica [pregabalin] Anaphylaxis      Medication List    STOP taking these medications   amLODipine 10 MG tablet Commonly known as:  NORVASC     TAKE these medications   aspirin EC 81 MG tablet Take 81 mg by mouth daily.   furosemide 40 MG tablet Commonly known as:  LASIX Take 0.5 tablets (20 mg total) by mouth 2 (two) times daily. Patient take 40mg  in the morning and 20mg  at 1200pm What changed:  how much to take   glipiZIDE 10 MG tablet Commonly known as:  GLUCOTROL Take 1 tablet (10 mg total) by mouth 2 (two) times daily before a meal.   labetalol 100 MG tablet Commonly known as:  NORMODYNE Take 2 tablets (200 mg total) by mouth 2 (two) times daily. What changed:  how much to take   lovastatin 20 MG tablet Commonly known as:  MEVACOR Take 20 mg by mouth at bedtime.      Allergies  Allergen Reactions  . Gabapentin Anaphylaxis  . Lyrica [Pregabalin] Anaphylaxis   Follow-up Information    [facility]. Schedule an appointment as soon as possible for a visit in 10 day(s).   Contact information: [place] [id number] [phone]        Fran Lowes, MD. Schedule an appointment as soon as possible for a visit in 3 week(s).   Specialty:  Nephrology Contact information: [address] [address] [phone]           The results of significant diagnostics from this hospitalization (including imaging, microbiology, ancillary and laboratory) are listed below for reference.    Significant Diagnostic Studies: No results found.  Labs: Basic Metabolic Panel: Recent  Labs  Lab 09/08/17 1015 09/08/17 1816 09/09/17 0556 09/09/17 1437 09/10/17 0449  NA 137 135 136 136 138  K 6.5* 5.9* 5.0 5.5* 4.7  CL 106 105 106 105 107  CO2 24 22 22 24 25   GLUCOSE 160* 202* 82 136* 150*  BUN 75* 69* 62* 56* 49*  CREATININE 2.64* 2.53* 2.15* 2.06* 1.81*  CALCIUM 9.3 8.6* 8.6* 9.0 8.7*  MG  --  2.4  --   --   --   PHOS  --  5.1*  --  4.3 4.3   Liver Function Tests: Recent Labs  Lab 09/09/17 1437 09/10/17 0449  ALBUMIN 4.3 3.8   CBC: Recent Labs  Lab 09/08/17 1015  WBC 5.4  NEUTROABS 3.8  HGB 11.0*  HCT 34.5*  MCV 81.9  PLT 128*    CBG: Recent Labs  Lab 09/09/17 1635 09/09/17 2059 09/09/17 2125 09/09/17 2218 09/10/17 0747  GLUCAP 110* 62* 63* 130* 130*    Signed:  Barton Dubois MD.  [facility] 09/10/2017, 9:43 AM

## 2017-09-11 LAB — ANTINUCLEAR ANTIBODIES, IFA: ANTINUCLEAR ANTIBODIES, IFA: NEGATIVE

## 2017-09-12 LAB — COMPLEMENT, TOTAL: Compl, Total (CH50): >60 U/mL (ref >41)

## 2017-10-10 DIAGNOSIS — M1990 Unspecified osteoarthritis, unspecified site: Secondary | ICD-10-CM | POA: Insufficient documentation

## 2017-10-10 DIAGNOSIS — N183 Chronic kidney disease, stage 3 unspecified: Secondary | ICD-10-CM | POA: Insufficient documentation

## 2019-10-10 ENCOUNTER — Encounter (INDEPENDENT_AMBULATORY_CARE_PROVIDER_SITE_OTHER): Payer: Self-pay

## 2019-10-10 ENCOUNTER — Encounter (HOSPITAL_COMMUNITY): Payer: Self-pay

## 2019-10-10 ENCOUNTER — Other Ambulatory Visit: Payer: Self-pay

## 2019-10-10 ENCOUNTER — Encounter (HOSPITAL_COMMUNITY)
Admission: RE | Admit: 2019-10-10 | Discharge: 2019-10-10 | Disposition: A | Discharge status: Home / Self Care | Payer: Medicaid Other | Source: Ambulatory Visit | Attending: Nephrology | Admitting: Nephrology

## 2019-10-10 DIAGNOSIS — D631 Anemia in chronic kidney disease: Secondary | ICD-10-CM | POA: Insufficient documentation

## 2019-10-10 DIAGNOSIS — N189 Chronic kidney disease, unspecified: Secondary | ICD-10-CM | POA: Insufficient documentation

## 2019-10-10 DIAGNOSIS — D509 Iron deficiency anemia, unspecified: Secondary | ICD-10-CM | POA: Diagnosis not present

## 2019-10-10 HISTORY — DX: Anemia, unspecified: D64.9

## 2019-10-10 MED ORDER — SODIUM CHLORIDE 0.9 % IV SOLN: Freq: Once | INTRAVENOUS | Status: AC

## 2019-10-10 MED ORDER — SODIUM CHLORIDE 0.9 % IV SOLN
510.0000 mg | Freq: Once | INTRAVENOUS | Status: AC
  Administered 2019-10-10: 510 mg via INTRAVENOUS
  Filled 2019-10-10: qty 17

## 2019-10-21 ENCOUNTER — Encounter (HOSPITAL_COMMUNITY): Payer: Self-pay

## 2019-10-21 ENCOUNTER — Other Ambulatory Visit: Payer: Self-pay

## 2019-10-21 ENCOUNTER — Encounter (HOSPITAL_COMMUNITY)
Admission: RE | Admit: 2019-10-21 | Discharge: 2019-10-21 | Disposition: A | Discharge status: Home / Self Care | Payer: Medicaid Other | Source: Ambulatory Visit | Attending: Nephrology | Admitting: Nephrology

## 2019-10-21 DIAGNOSIS — D509 Iron deficiency anemia, unspecified: Secondary | ICD-10-CM | POA: Diagnosis not present

## 2019-10-21 MED ORDER — SODIUM CHLORIDE 0.9 % IV SOLN: INTRAVENOUS | Status: DC

## 2019-10-21 MED ORDER — SODIUM CHLORIDE 0.9 % IV SOLN
510.0000 mg | Freq: Once | INTRAVENOUS | Status: AC
  Administered 2019-10-21: 510 mg via INTRAVENOUS
  Filled 2019-10-21: qty 510

## 2019-12-25 ENCOUNTER — Encounter: Payer: Self-pay | Admitting: Internal Medicine

## 2020-01-23 ENCOUNTER — Other Ambulatory Visit: Payer: Self-pay

## 2020-01-23 DIAGNOSIS — N183 Chronic kidney disease, stage 3 unspecified: Secondary | ICD-10-CM

## 2020-01-29 ENCOUNTER — Other Ambulatory Visit: Payer: Self-pay

## 2020-01-29 ENCOUNTER — Ambulatory Visit: Payer: Medicaid Other | Admitting: Internal Medicine

## 2020-01-29 ENCOUNTER — Encounter: Payer: Self-pay | Admitting: Internal Medicine

## 2020-01-29 DIAGNOSIS — N183 Chronic kidney disease, stage 3 unspecified: Secondary | ICD-10-CM

## 2020-02-04 ENCOUNTER — Other Ambulatory Visit: Payer: Self-pay | Admitting: *Deleted

## 2020-02-05 ENCOUNTER — Ambulatory Visit (HOSPITAL_COMMUNITY)
Admission: RE | Admit: 2020-02-05 | Discharge: 2020-02-05 | Disposition: A | Discharge status: Home / Self Care | Payer: Medicaid Other | Source: Ambulatory Visit | Attending: Vascular Surgery | Admitting: Vascular Surgery

## 2020-02-05 ENCOUNTER — Other Ambulatory Visit: Payer: Self-pay

## 2020-02-05 DIAGNOSIS — N183 Chronic kidney disease, stage 3 unspecified: Secondary | ICD-10-CM | POA: Insufficient documentation

## 2020-02-10 ENCOUNTER — Ambulatory Visit (INDEPENDENT_AMBULATORY_CARE_PROVIDER_SITE_OTHER): Payer: Self-pay | Admitting: Vascular Surgery

## 2020-02-10 ENCOUNTER — Other Ambulatory Visit: Payer: Self-pay

## 2020-02-10 ENCOUNTER — Encounter: Payer: Self-pay | Admitting: Vascular Surgery

## 2020-02-10 VITALS — BP 166/96 | HR 93 | Temp 97.7°F | Resp 16 | Ht 66.0 in | Wt 245.0 lb

## 2020-02-10 DIAGNOSIS — N183 Chronic kidney disease, stage 3 unspecified: Secondary | ICD-10-CM

## 2020-02-10 NOTE — Progress Notes (Signed)
[facility]  Patient name: Kevin Meyer MRN: [id number] DOB: 1970/07/19 Sex: male  REASON FOR CONSULT: Discuss access for hemodialysis  HPI: Kevin Meyer is a 49 y.o. male, here today for discussion of access for hemodialysis.  He has severe chronic renal insufficiency but is not on dialysis currently.  Does have a history of diabetes and hypertension as the cause of his renal insufficiency.  He is right-handed.  He has no history of pacemaker placement and is not on any anticoagulation.  Past Medical History:  Diagnosis Date  . Anemia   . Diabetes mellitus without complication ([facility])   . High cholesterol   . Hypertension   . Renal disorder     Family History  Problem Relation Age of Onset  . Stroke Mother   . Diabetes Other     SOCIAL HISTORY: Social History   Socioeconomic History  . Marital status: Single    Spouse name: Not on file  . Number of children: Not on file  . Years of education: Not on file  . Highest education level: Not on file  Occupational History  . Not on file  Tobacco Use  . Smoking status: Never Smoker  . Smokeless tobacco: Never Used  Vaping Use  . Vaping Use: Never used  Substance and Sexual Activity  . Alcohol use: Never    Comment: occasional  . Drug use: Never  . Sexual activity: Not on file  Other Topics Concern  . Not on file  Social History Narrative  . Not on file   Social Determinants of Health   Financial Resource Strain:   . Difficulty of Paying Living Expenses: Not on file  Food Insecurity:   . Worried About Charity fundraiser in the Last Year: Not on file  . Ran Out of Food in the Last Year: Not on file  Transportation Needs:   . Lack of Transportation (Medical): Not on file  . Lack of Transportation (Non-Medical): Not on file  Physical Activity:   . Days of Exercise per Week: Not on file  . Minutes of Exercise per Session: Not on file  Stress:   . Feeling of Stress : Not on file   Social Connections:   . Frequency of Communication with Friends and Family: Not on file  . Frequency of Social Gatherings with Friends and Family: Not on file  . Attends Religious Services: Not on file  . Active Member of Clubs or Organizations: Not on file  . Attends Archivist Meetings: Not on file  . Marital Status: Not on file  Intimate Partner Violence:   . Fear of Current or Ex-Partner: Not on file  . Emotionally Abused: Not on file  . Physically Abused: Not on file  . Sexually Abused: Not on file    Allergies  Allergen Reactions  . Gabapentin Anaphylaxis  . Lyrica [Pregabalin] Anaphylaxis    Current Outpatient Medications  Medication Sig Dispense Refill  . aspirin EC 81 MG tablet Take 81 mg by mouth daily.    . furosemide (LASIX) 40 MG tablet Take 0.5 tablets (20 mg total) by mouth 2 (two) times daily. Patient take 40mg  in the morning and 20mg  at 1200pm (Patient taking differently: Take 40 mg by mouth 2 (two) times daily. ) 30 tablet   . glipiZIDE (GLUCOTROL) 10 MG tablet Take 1 tablet (10 mg total) by mouth 2 (two) times daily before a meal.    . labetalol (NORMODYNE)  100 MG tablet Take 2 tablets (200 mg total) by mouth 2 (two) times daily. (Patient taking differently: Take 400 mg by mouth 2 (two) times daily. ) 120 tablet 1  . lovastatin (MEVACOR) 20 MG tablet Take 20 mg by mouth at bedtime.     No current facility-administered medications for this visit.    REVIEW OF SYSTEMS:  [X]  denotes positive finding, [ ]  denotes negative finding Cardiac  Comments:  Chest pain or chest pressure:    Shortness of breath upon exertion:    Short of breath when lying flat:    Irregular heart rhythm:        Vascular    Pain in calf, thigh, or hip brought on by ambulation:    Pain in feet at night that wakes you up from your sleep:     Blood clot in your veins:    Leg swelling:         Pulmonary    Oxygen at home:    Productive cough:     Wheezing:          Neurologic    Sudden weakness in arms or legs:     Sudden numbness in arms or legs:     Sudden onset of difficulty speaking or slurred speech:    Temporary loss of vision in one eye:     Problems with dizziness:         Gastrointestinal    Blood in stool:     Vomited blood:         Genitourinary    Burning when urinating:     Blood in urine:        Psychiatric    Major depression:         Hematologic    Bleeding problems:    Problems with blood clotting too easily:        Skin    Rashes or ulcers:        Constitutional    Fever or chills:      PHYSICAL EXAM: Vitals:   02/10/20 1006  BP: (!) 166/96  Pulse: 93  Resp: 16  Temp: 97.7 F (36.5 C)  TempSrc: Other (Comment)  SpO2: 96%  Weight: 245 lb (111.1 kg)  Height: 5\' 6"  (1.676 m)    GENERAL: The patient is a well-nourished male, in no acute distress. The vital signs are documented above. CARDIAC: There is a regular rate and rhythm.  VASCULAR: 2+ radial and 2+ brachial pulses bilaterally.  Does not have very apparent surface veins bilaterally. PULMONARY: There is good air exchange bilaterally without wheezing or rales. ABDOMEN: Soft and non-tender with normal pitched bowel sounds.  MUSCULOSKELETAL: There are no major deformities or cyanosis. NEUROLOGIC: No focal weakness or paresthesias are detected. SKIN: There are no ulcers or rashes noted. PSYCHIATRIC: The patient has a normal affect.  DATA:   Vein map at [facility] on 02/05/2020 was reviewed.  This reveals a patent cephalic veins bilaterally with moderate size bilaterally.  Normal arterial flow bilaterally  I did image his veins with SonoSite ultrasound in our office.  He does have good size cephalic vein at the wrist and better at the antecubital space proximally.  MEDICAL ISSUES:  Had a long discussion with the patient regarding options for hemodialysis access.  I discussed tunneled hemodialysis catheter placement, AV graft and AV  fistulas.  Discussed the advantages and disadvantages of each of these.  Explained that ideally we would have access functioning prior to  need for hemodialysis.  He does appear to be a candidate for left arm AV fistula creation.  Understands the potential for nonmaturation we also discussed the potential for steal.  He wished to proceed we have scheduled him for outpatient at [facility] on 02/20/2020   Curt Jews [facility] [employer] phone [phone]

## 2020-02-10 NOTE — H&P (View-Only) (Signed)
[facility] [place]  Patient name: Kevin Meyer MRN: [id number] DOB: 1970-08-07 Sex: male  REASON FOR CONSULT: Discuss access for hemodialysis  HPI: Kevin Meyer is a 49 y.o. male, here today for discussion of access for hemodialysis.  He has severe chronic renal insufficiency but is not on dialysis currently.  Does have a history of diabetes and hypertension as the cause of his renal insufficiency.  He is right-handed.  He has no history of pacemaker placement and is not on any anticoagulation.  Past Medical History:  Diagnosis Date  . Anemia   . Diabetes mellitus without complication ([place])   . High cholesterol   . Hypertension   . Renal disorder     Family History  Problem Relation Age of Onset  . Stroke Mother   . Diabetes Other     SOCIAL HISTORY: Social History   Socioeconomic History  . Marital status: Single    Spouse name: Not on file  . Number of children: Not on file  . Years of education: Not on file  . Highest education level: Not on file  Occupational History  . Not on file  Tobacco Use  . Smoking status: Never Smoker  . Smokeless tobacco: Never Used  Vaping Use  . Vaping Use: Never used  Substance and Sexual Activity  . Alcohol use: Never    Comment: occasional  . Drug use: Never  . Sexual activity: Not on file  Other Topics Concern  . Not on file  Social History Narrative  . Not on file   Social Determinants of Health   Financial Resource Strain:   . Difficulty of Paying Living Expenses: Not on file  Food Insecurity:   . Worried About Charity fundraiser in the Last Year: Not on file  . Ran Out of Food in the Last Year: Not on file  Transportation Needs:   . Lack of Transportation (Medical): Not on file  . Lack of Transportation (Non-Medical): Not on file  Physical Activity:   . Days of Exercise per Week: Not on file  . Minutes of Exercise per Session: Not on file  Stress:   . Feeling of Stress : Not on file   Social Connections:   . Frequency of Communication with Friends and Family: Not on file  . Frequency of Social Gatherings with Friends and Family: Not on file  . Attends Religious Services: Not on file  . Active Member of Clubs or Organizations: Not on file  . Attends Archivist Meetings: Not on file  . Marital Status: Not on file  Intimate Partner Violence:   . Fear of Current or Ex-Partner: Not on file  . Emotionally Abused: Not on file  . Physically Abused: Not on file  . Sexually Abused: Not on file    Allergies  Allergen Reactions  . Gabapentin Anaphylaxis  . Lyrica [Pregabalin] Anaphylaxis    Current Outpatient Medications  Medication Sig Dispense Refill  . aspirin EC 81 MG tablet Take 81 mg by mouth daily.    . furosemide (LASIX) 40 MG tablet Take 0.5 tablets (20 mg total) by mouth 2 (two) times daily. Patient take 40mg  in the morning and 20mg  at 1200pm (Patient taking differently: Take 40 mg by mouth 2 (two) times daily. ) 30 tablet   . glipiZIDE (GLUCOTROL) 10 MG tablet Take 1 tablet (10 mg total) by mouth 2 (two) times daily before a meal.    . labetalol (NORMODYNE)  100 MG tablet Take 2 tablets (200 mg total) by mouth 2 (two) times daily. (Patient taking differently: Take 400 mg by mouth 2 (two) times daily. ) 120 tablet 1  . lovastatin (MEVACOR) 20 MG tablet Take 20 mg by mouth at bedtime.     No current facility-administered medications for this visit.    REVIEW OF SYSTEMS:  [X]  denotes positive finding, [ ]  denotes negative finding Cardiac  Comments:  Chest pain or chest pressure:    Shortness of breath upon exertion:    Short of breath when lying flat:    Irregular heart rhythm:        Vascular    Pain in calf, thigh, or hip brought on by ambulation:    Pain in feet at night that wakes you up from your sleep:     Blood clot in your veins:    Leg swelling:         Pulmonary    Oxygen at home:    Productive cough:     Wheezing:          Neurologic    Sudden weakness in arms or legs:     Sudden numbness in arms or legs:     Sudden onset of difficulty speaking or slurred speech:    Temporary loss of vision in one eye:     Problems with dizziness:         Gastrointestinal    Blood in stool:     Vomited blood:         Genitourinary    Burning when urinating:     Blood in urine:        Psychiatric    Major depression:         Hematologic    Bleeding problems:    Problems with blood clotting too easily:        Skin    Rashes or ulcers:        Constitutional    Fever or chills:      PHYSICAL EXAM: Vitals:   02/10/20 1006  BP: (!) 166/96  Pulse: 93  Resp: 16  Temp: 97.7 F (36.5 C)  TempSrc: Other (Comment)  SpO2: 96%  Weight: 245 lb (111.1 kg)  Height: 5\' 6"  (1.676 m)    GENERAL: The patient is a well-nourished male, in no acute distress. The vital signs are documented above. CARDIAC: There is a regular rate and rhythm.  VASCULAR: 2+ radial and 2+ brachial pulses bilaterally.  Does not have very apparent surface veins bilaterally. PULMONARY: There is good air exchange bilaterally without wheezing or rales. ABDOMEN: Soft and non-tender with normal pitched bowel sounds.  MUSCULOSKELETAL: There are no major deformities or cyanosis. NEUROLOGIC: No focal weakness or paresthesias are detected. SKIN: There are no ulcers or rashes noted. PSYCHIATRIC: The patient has a normal affect.  DATA:   Vein map at [facility] on 02/05/2020 was reviewed.  This reveals a patent cephalic veins bilaterally with moderate size bilaterally.  Normal arterial flow bilaterally  I did image his veins with SonoSite ultrasound in our office.  He does have good size cephalic vein at the wrist and better at the antecubital space proximally.  MEDICAL ISSUES:  Had a long discussion with the patient regarding options for hemodialysis access.  I discussed tunneled hemodialysis catheter placement, AV graft and AV  fistulas.  Discussed the advantages and disadvantages of each of these.  Explained that ideally we would have access functioning prior to  need for hemodialysis.  He does appear to be a candidate for left arm AV fistula creation.  Understands the potential for nonmaturation we also discussed the potential for steal.  He wished to proceed we have scheduled him for outpatient at [facility] on 02/20/2020   Curt Jews [facility] [place] phone [phone]

## 2020-02-11 ENCOUNTER — Encounter: Payer: Medicaid Other | Admitting: Vascular Surgery

## 2020-02-11 ENCOUNTER — Other Ambulatory Visit (HOSPITAL_COMMUNITY): Payer: Medicaid Other

## 2020-02-11 ENCOUNTER — Encounter (HOSPITAL_COMMUNITY): Payer: Self-pay

## 2020-02-14 NOTE — Patient Instructions (Signed)
Kevin Meyer  02/14/2020     @PREFPERIOPPHARMACY @   Your procedure is scheduled on  02/20/2020.  Report to [facility] at  [phone]  A.M.  Call this number if you have problems the morning of surgery:  [phone]   Remember:  Do not eat or drink after midnight.                          Take these medicines the morning of surgery with A SIP OF WATER  Allopurinol, labetolol.    Do not wear jewelry, make-up or nail polish.  Do not wear lotions, powders, or perfumes, or deodorant. Please brush your teeth.  Do not shave 48 hours prior to surgery.  Men may shave face and neck.  Do not bring valuables to the hospital.  [facility] is not responsible for any belongings or valuables.  Contacts, dentures or bridgework may not be worn into surgery.  Leave your suitcase in the car.  After surgery it may be brought to your room.  For patients admitted to the hospital, discharge time will be determined by your treatment team.  Patients discharged the day of surgery will not be allowed to drive home.   Name and phone number of your driver:   family Special instructions:  DO NOT smoke the morning of your procedure.  Please read over the following fact sheets that you were given. Anesthesia Post-op Instructions and Care and Recovery After Surgery       AV Fistula Placement, Care After This sheet gives you information about how to care for yourself after your procedure. Your health care provider may also give you more specific instructions. If you have problems or questions, contact your health care provider. What can I expect after the procedure? After the procedure, it is common to:  Feel sore.  Feel a vibration (thrill) over the fistula. Follow these instructions at home: Incision care      Follow instructions from your health care provider about how to take care of your incision. Make sure you: ? Wash your hands with soap and water before and after you change your  bandage (dressing). If soap and water are not available, use hand sanitizer. ? Change your dressing as told by your health care provider. ? Leave stitches (sutures), skin glue, or adhesive strips in place. These skin closures may need to stay in place for 2 weeks or longer. If adhesive strip edges start to loosen and curl up, you may trim the loose edges. Do not remove adhesive strips completely unless your health care provider tells you to do that. Fistula care  Check your fistula site every day to make sure the thrill feels the same.  Check your fistula site every day for signs of infection. Check for: ? Redness, swelling, or pain. ? Fluid or blood. ? Warmth. ? Pus or a bad smell.  Raise (elevate) the affected area above the level of your heart while you are sitting or lying down.  Do not lift anything that is heavier than 10 lb (4.5 kg), or the limit that you are told, until your health care provider says that it is safe.  Do not lie down on your fistula arm.  Do not let anyone draw blood or take a blood pressure reading on your fistula arm. This is important.  Do not wear tight jewelry or clothing over your fistula arm. Bathing  Do not take baths, swim, or use a hot tub until your health care provider approves. Ask your health care provider if you may take showers. You may only be allowed to take sponge baths.  Keep the area around your incision clean and dry. Medicines  Take over-the-counter and prescription medicines only as told by your health care provider.  Ask your health care provider if any medicine prescribed to you can cause constipation. You may need to take steps to prevent or treat constipation, such as: ? Drink enough fluid to keep your urine pale yellow. ? Take over-the-counter or prescription medicines. ? Eat foods that are high in fiber, such as beans, whole grains, and fresh fruits and vegetables. ? Limit foods that are high in fat and processed sugars, such  as fried or sweet foods. General instructions  Rest at home for a day or two.  Return to your normal activities as told by your health care provider. Ask your health care provider what activities are safe for you.  Keep all follow-up visits as told by your health care provider. This is important. Contact a health care provider if:  You have redness, swelling, or pain around your fistula site.  Your fistula site feels warm to the touch.  You have pus or a bad smell coming from your fistula site.  You have a fever.  You have numbness or coldness at your fistula site.  You feel a decrease or a change in the thrill. Get help right away if you:  Are bleeding from your fistula site.  Have chest pain.  Have trouble breathing. Summary  Follow instructions from your health care provider about how to take care of your incision.  Do not let anyone draw blood or take a blood pressure reading on your fistula arm. This is important.  Return to your normal activities as told by your health care provider. Ask your health care provider what activities are safe for you.  Contact a health care provider if you have a change in the thrill or have any signs of infection at your fistula site.  Keep all follow-up visits as told by your health care provider. This is important. This information is not intended to replace advice given to you by your health care provider. Make sure you discuss any questions you have with your health care provider. Document Revised: 09/28/2018 Document Reviewed: 10/16/2017 Elsevier Patient Education  2020 [place] After These instructions provide you with information about caring for yourself after your procedure. Your health care provider may also give you more specific instructions. Your treatment has been planned according to current medical practices, but problems sometimes occur. Call your health care provider if you have  any problems or questions after your procedure. What can I expect after the procedure? After your procedure, you may:  Feel sleepy for several hours.  Feel clumsy and have poor balance for several hours.  Feel forgetful about what happened after the procedure.  Have poor judgment for several hours.  Feel nauseous or vomit.  Have a sore throat if you had a breathing tube during the procedure. Follow these instructions at home: For at least 24 hours after the procedure:      Have a responsible adult stay with you. It is important to have someone help care for you until you are awake and alert.  Rest as needed.  Do not: ? Participate in activities in which you could fall or become  injured. ? Drive. ? Use heavy machinery. ? Drink alcohol. ? Take sleeping pills or medicines that cause drowsiness. ? Make important decisions or sign legal documents. ? Take care of children on your own. Eating and drinking  Follow the diet that is recommended by your health care provider.  If you vomit, drink water, juice, or soup when you can drink without vomiting.  Make sure you have little or no nausea before eating solid foods. General instructions  Take over-the-counter and prescription medicines only as told by your health care provider.  If you have sleep apnea, surgery and certain medicines can increase your risk for breathing problems. Follow instructions from your health care provider about wearing your sleep device: ? Anytime you are sleeping, including during daytime naps. ? While taking prescription pain medicines, sleeping medicines, or medicines that make you drowsy.  If you smoke, do not smoke without supervision.  Keep all follow-up visits as told by your health care provider. This is important. Contact a health care provider if:  You keep feeling nauseous or you keep vomiting.  You feel light-headed.  You develop a rash.  You have a fever. Get help right away  if:  You have trouble breathing. Summary  For several hours after your procedure, you may feel sleepy and have poor judgment.  Have a responsible adult stay with you for at least 24 hours or until you are awake and alert. This information is not intended to replace advice given to you by your health care provider. Make sure you discuss any questions you have with your health care provider. Document Revised: 07/10/2017 Document Reviewed: 08/02/2015 Elsevier Patient Education  [place]. How to Use Chlorhexidine for Bathing Chlorhexidine gluconate (CHG) is a germ-killing (antiseptic) solution that is used to clean the skin. It can get rid of the bacteria that normally live on the skin and can keep them away for about 24 hours. To clean your skin with CHG, you may be given:  A CHG solution to use in the shower or as part of a sponge bath.  A prepackaged cloth that contains CHG. Cleaning your skin with CHG may help lower the risk for infection:  While you are staying in the intensive care unit of the hospital.  If you have a vascular access, such as a central line, to provide short-term or long-term access to your veins.  If you have a catheter to drain urine from your bladder.  If you are on a ventilator. A ventilator is a machine that helps you breathe by moving air in and out of your lungs.  After surgery. What are the risks? Risks of using CHG include:  A skin reaction.  Hearing loss, if CHG gets in your ears.  Eye injury, if CHG gets in your eyes and is not rinsed out.  The CHG product catching fire. Make sure that you avoid smoking and flames after applying CHG to your skin. Do not use CHG:  If you have a chlorhexidine allergy or have previously reacted to chlorhexidine.  On babies younger than 49 months of age. How to use CHG solution  Use CHG only as told by your health care provider, and follow the instructions on the label.  Use the full amount of CHG as  directed. Usually, this is one bottle. During a shower Follow these steps when using CHG solution during a shower (unless your health care provider gives you different instructions): 1. Start the shower. 2. Use your normal  soap and shampoo to wash your face and hair. 3. Turn off the shower or move out of the shower stream. 4. Pour the CHG onto a clean washcloth. Do not use any type of brush or rough-edged sponge. 5. Starting at your neck, lather your body down to your toes. Make sure you follow these instructions: ? If you will be having surgery, pay special attention to the part of your body where you will be having surgery. Scrub this area for at least 1 minute. ? Do not use CHG on your head or face. If the solution gets into your ears or eyes, rinse them well with water. ? Avoid your genital area. ? Avoid any areas of skin that have broken skin, cuts, or scrapes. ? Scrub your back and under your arms. Make sure to wash skin folds. 6. Let the lather sit on your skin for 1-2 minutes or as long as told by your health care provider. 7. Thoroughly rinse your entire body in the shower. Make sure that all body creases and crevices are rinsed well. 8. Dry off with a clean towel. Do not put any substances on your body afterward--such as powder, lotion, or perfume--unless you are told to do so by your health care provider. Only use lotions that are recommended by the manufacturer. 9. Put on clean clothes or pajamas. 10. If it is the night before your surgery, sleep in clean sheets.  During a sponge bath Follow these steps when using CHG solution during a sponge bath (unless your health care provider gives you different instructions): 1. Use your normal soap and shampoo to wash your face and hair. 2. Pour the CHG onto a clean washcloth. 3. Starting at your neck, lather your body down to your toes. Make sure you follow these instructions: ? If you will be having surgery, pay special attention to the  part of your body where you will be having surgery. Scrub this area for at least 1 minute. ? Do not use CHG on your head or face. If the solution gets into your ears or eyes, rinse them well with water. ? Avoid your genital area. ? Avoid any areas of skin that have broken skin, cuts, or scrapes. ? Scrub your back and under your arms. Make sure to wash skin folds. 4. Let the lather sit on your skin for 1-2 minutes or as long as told by your health care provider. 5. Using a different clean, wet washcloth, thoroughly rinse your entire body. Make sure that all body creases and crevices are rinsed well. 6. Dry off with a clean towel. Do not put any substances on your body afterward--such as powder, lotion, or perfume--unless you are told to do so by your health care provider. Only use lotions that are recommended by the manufacturer. 7. Put on clean clothes or pajamas. 8. If it is the night before your surgery, sleep in clean sheets. How to use CHG prepackaged cloths  Only use CHG cloths as told by your health care provider, and follow the instructions on the label.  Use the CHG cloth on clean, dry skin.  Do not use the CHG cloth on your head or face unless your health care provider tells you to.  When washing with the CHG cloth: ? Avoid your genital area. ? Avoid any areas of skin that have broken skin, cuts, or scrapes. Before surgery Follow these steps when using a CHG cloth to clean before surgery (unless your health care provider gives  you different instructions): 1. Using the CHG cloth, vigorously scrub the part of your body where you will be having surgery. Scrub using a back-and-forth motion for 3 minutes. The area on your body should be completely wet with CHG when you are done scrubbing. 2. Do not rinse. Discard the cloth and let the area air-dry. Do not put any substances on the area afterward, such as powder, lotion, or perfume. 3. Put on clean clothes or pajamas. 4. If it is the  night before your surgery, sleep in clean sheets.  For general bathing Follow these steps when using CHG cloths for general bathing (unless your health care provider gives you different instructions). 1. Use a separate CHG cloth for each area of your body. Make sure you wash between any folds of skin and between your fingers and toes. Wash your body in the following order, switching to a new cloth after each step: ? The front of your neck, shoulders, and chest. ? Both of your arms, under your arms, and your hands. ? Your stomach and groin area, avoiding the genitals. ? Your right leg and foot. ? Your left leg and foot. ? The back of your neck, your back, and your buttocks. 2. Do not rinse. Discard the cloth and let the area air-dry. Do not put any substances on your body afterward--such as powder, lotion, or perfume--unless you are told to do so by your health care provider. Only use lotions that are recommended by the manufacturer. 3. Put on clean clothes or pajamas. Contact a health care provider if:  Your skin gets irritated after scrubbing.  You have questions about using your solution or cloth. Get help right away if:  Your eyes become very red or swollen.  Your eyes itch badly.  Your skin itches badly and is red or swollen.  Your hearing changes.  You have trouble seeing.  You have swelling or tingling in your mouth or throat.  You have trouble breathing.  You swallow any chlorhexidine. Summary  Chlorhexidine gluconate (CHG) is a germ-killing (antiseptic) solution that is used to clean the skin. Cleaning your skin with CHG may help to lower your risk for infection.  You may be given CHG to use for bathing. It may be in a bottle or in a prepackaged cloth to use on your skin. Carefully follow your health care provider's instructions and the instructions on the product label.  Do not use CHG if you have a chlorhexidine allergy.  Contact your health care provider if your  skin gets irritated after scrubbing. This information is not intended to replace advice given to you by your health care provider. Make sure you discuss any questions you have with your health care provider. Document Revised: 06/28/2018 Document Reviewed: 03/09/2017 Elsevier Patient Education  [place].

## 2020-02-18 ENCOUNTER — Encounter (HOSPITAL_COMMUNITY): Payer: Self-pay

## 2020-02-18 ENCOUNTER — Other Ambulatory Visit: Payer: Self-pay

## 2020-02-18 ENCOUNTER — Other Ambulatory Visit (HOSPITAL_COMMUNITY)
Admission: RE | Admit: 2020-02-18 | Discharge: 2020-02-18 | Disposition: A | Discharge status: Home / Self Care | Payer: Medicaid Other | Source: Ambulatory Visit | Attending: Vascular Surgery | Admitting: Vascular Surgery

## 2020-02-18 ENCOUNTER — Encounter (HOSPITAL_COMMUNITY)
Admission: RE | Admit: 2020-02-18 | Discharge: 2020-02-18 | Disposition: A | Discharge status: Home / Self Care | Payer: Medicaid Other | Source: Ambulatory Visit | Attending: Vascular Surgery | Admitting: Vascular Surgery

## 2020-02-18 DIAGNOSIS — Z01818 Encounter for other preprocedural examination: Secondary | ICD-10-CM | POA: Diagnosis not present

## 2020-02-18 DIAGNOSIS — Z20822 Contact with and (suspected) exposure to covid-19: Secondary | ICD-10-CM | POA: Insufficient documentation

## 2020-02-18 HISTORY — DX: Unspecified osteoarthritis, unspecified site: M19.90

## 2020-02-18 HISTORY — DX: Gout, unspecified: M10.9

## 2020-02-18 LAB — CBC WITH DIFFERENTIAL/PLATELET
Abs Immature Granulocytes: 0.03 10*3/uL (ref 0.00–0.07)
Basophils Absolute: 0 10*3/uL (ref 0.0–0.1)
Basophils Relative: 0 %
Eosinophils Absolute: 0.2 10*3/uL (ref 0.0–0.5)
Eosinophils Relative: 4 %
HCT: 30.8 % — ABNORMAL LOW (ref 39.0–52.0)
Hemoglobin: 10 g/dL — ABNORMAL LOW (ref 13.0–17.0)
Immature Granulocytes: 1 %
Lymphocytes Relative: 12 %
Lymphs Abs: 0.7 10*3/uL (ref 0.7–4.0)
MCH: 28.9 pg (ref 26.0–34.0)
MCHC: 32.5 g/dL (ref 30.0–36.0)
MCV: 89 fL (ref 80.0–100.0)
Monocytes Absolute: 0.5 10*3/uL (ref 0.1–1.0)
Monocytes Relative: 8 %
Neutro Abs: 4.5 10*3/uL (ref 1.7–7.7)
Neutrophils Relative %: 75 %
Platelets: 137 10*3/uL — ABNORMAL LOW (ref 150–400)
RBC: 3.46 MIL/uL — ABNORMAL LOW (ref 4.22–5.81)
RDW: 13.2 % (ref 11.5–15.5)
WBC: 6 10*3/uL (ref 4.0–10.5)
nRBC: 0 % (ref 0.0–0.2)

## 2020-02-18 LAB — BASIC METABOLIC PANEL
Anion gap: 9 (ref 5–15)
BUN: 76 mg/dL — ABNORMAL HIGH (ref 6–20)
CO2: 23 mmol/L (ref 22–32)
Calcium: 8.8 mg/dL — ABNORMAL LOW (ref 8.9–10.3)
Chloride: 102 mmol/L (ref 98–111)
Creatinine, Ser: 4.44 mg/dL — ABNORMAL HIGH (ref 0.61–1.24)
GFR, Estimated: 15 mL/min — ABNORMAL LOW (ref >60)
Glucose, Bld: 165 mg/dL — ABNORMAL HIGH (ref 70–99)
Potassium: 4.1 mmol/L (ref 3.5–5.1)
Sodium: 134 mmol/L — ABNORMAL LOW (ref 135–145)

## 2020-02-18 LAB — SARS CORONAVIRUS 2 (TAT 6-24 HRS): SARS Coronavirus 2: NEGATIVE

## 2020-02-19 LAB — HEMOGLOBIN A1C
Hgb A1c MFr Bld: 6 % — ABNORMAL HIGH (ref 4.8–5.6)
Mean Plasma Glucose: 126 mg/dL

## 2020-02-19 NOTE — Pre-Procedure Instructions (Signed)
Hgba1c routed to PCP. 

## 2020-02-20 ENCOUNTER — Ambulatory Visit (HOSPITAL_COMMUNITY): Payer: Medicaid Other | Admitting: Anesthesiology

## 2020-02-20 ENCOUNTER — Other Ambulatory Visit: Payer: Self-pay

## 2020-02-20 ENCOUNTER — Encounter (HOSPITAL_COMMUNITY): Payer: Self-pay | Admitting: Vascular Surgery

## 2020-02-20 ENCOUNTER — Encounter (HOSPITAL_COMMUNITY)
Admission: RE | Disposition: A | Discharge status: Home / Self Care | Payer: Self-pay | Source: Home / Self Care | Attending: Vascular Surgery

## 2020-02-20 ENCOUNTER — Ambulatory Visit (HOSPITAL_COMMUNITY)
Admission: RE | Admit: 2020-02-20 | Discharge: 2020-02-20 | Disposition: A | Discharge status: Home / Self Care | Payer: Medicaid Other | Attending: Vascular Surgery | Admitting: Vascular Surgery

## 2020-02-20 DIAGNOSIS — N186 End stage renal disease: Secondary | ICD-10-CM | POA: Diagnosis not present

## 2020-02-20 DIAGNOSIS — N183 Chronic kidney disease, stage 3 unspecified: Secondary | ICD-10-CM

## 2020-02-20 DIAGNOSIS — E1122 Type 2 diabetes mellitus with diabetic chronic kidney disease: Secondary | ICD-10-CM | POA: Diagnosis present

## 2020-02-20 DIAGNOSIS — I120 Hypertensive chronic kidney disease with stage 5 chronic kidney disease or end stage renal disease: Secondary | ICD-10-CM | POA: Insufficient documentation

## 2020-02-20 DIAGNOSIS — N185 Chronic kidney disease, stage 5: Secondary | ICD-10-CM

## 2020-02-20 HISTORY — PX: AV FISTULA PLACEMENT: SHX1204

## 2020-02-20 LAB — GLUCOSE, CAPILLARY
Glucose-Capillary: 152 mg/dL — ABNORMAL HIGH (ref 70–99)
Glucose-Capillary: 136 mg/dL — ABNORMAL HIGH (ref 70–99)

## 2020-02-20 SURGERY — ARTERIOVENOUS (AV) FISTULA CREATION
Anesthesia: Monitor Anesthesia Care | Site: Arm Lower | Laterality: Left

## 2020-02-20 MED ORDER — SODIUM CHLORIDE (PF) 0.9 % IJ SOLN
INTRAMUSCULAR | Status: AC
  Filled 2020-02-20: qty 20

## 2020-02-20 MED ORDER — ONDANSETRON HCL 4 MG/2ML IJ SOLN: 4.0000 mg | Freq: Once | INTRAMUSCULAR | Status: DC | PRN

## 2020-02-20 MED ORDER — SODIUM CHLORIDE 0.9 % IV SOLN: INTRAVENOUS | Status: DC

## 2020-02-20 MED ORDER — ORAL CARE MOUTH RINSE: 15.0000 mL | Freq: Once | OROMUCOSAL | Status: AC

## 2020-02-20 MED ORDER — LACTATED RINGERS IV SOLN: INTRAVENOUS | Status: DC

## 2020-02-20 MED ORDER — HYDROMORPHONE HCL 1 MG/ML IJ SOLN: 0.2500 mg | INTRAMUSCULAR | Status: DC | PRN

## 2020-02-20 MED ORDER — MIDAZOLAM HCL 5 MG/5ML IJ SOLN
INTRAMUSCULAR | Status: DC | PRN
  Administered 2020-02-20: 2 mg via INTRAVENOUS

## 2020-02-20 MED ORDER — CHLORHEXIDINE GLUCONATE 4 % EX LIQD: 60.0000 mL | Freq: Once | CUTANEOUS | Status: DC

## 2020-02-20 MED ORDER — MEPERIDINE HCL 50 MG/ML IJ SOLN: 6.2500 mg | INTRAMUSCULAR | Status: DC | PRN

## 2020-02-20 MED ORDER — PROPOFOL 10 MG/ML IV BOLUS
INTRAVENOUS | Status: AC
  Filled 2020-02-20: qty 40

## 2020-02-20 MED ORDER — ACETAMINOPHEN 10 MG/ML IV SOLN: 1000.0000 mg | Freq: Once | INTRAVENOUS | Status: DC | PRN

## 2020-02-20 MED ORDER — HEPARIN SODIUM (PORCINE) 1000 UNIT/ML IJ SOLN
INTRAMUSCULAR | Status: AC
  Filled 2020-02-20: qty 6

## 2020-02-20 MED ORDER — PROPOFOL 10 MG/ML IV BOLUS
INTRAVENOUS | Status: DC | PRN
  Administered 2020-02-20: 75 ug/kg/min via INTRAVENOUS
  Administered 2020-02-20: 60 mg via INTRAVENOUS

## 2020-02-20 MED ORDER — CEFAZOLIN SODIUM-DEXTROSE 2-4 GM/100ML-% IV SOLN
2.0000 g | INTRAVENOUS | Status: AC
  Administered 2020-02-20: 2 g via INTRAVENOUS
  Filled 2020-02-20: qty 100

## 2020-02-20 MED ORDER — LIDOCAINE-EPINEPHRINE 0.5 %-1:200000 IJ SOLN
INTRAMUSCULAR | Status: DC | PRN
  Administered 2020-02-20: 8 mL

## 2020-02-20 MED ORDER — KETAMINE HCL 50 MG/5ML IJ SOSY
PREFILLED_SYRINGE | INTRAMUSCULAR | Status: AC
  Filled 2020-02-20: qty 5

## 2020-02-20 MED ORDER — MIDAZOLAM HCL 2 MG/2ML IJ SOLN
INTRAMUSCULAR | Status: AC
  Filled 2020-02-20: qty 2

## 2020-02-20 MED ORDER — LIDOCAINE-EPINEPHRINE 0.5 %-1:200000 IJ SOLN
INTRAMUSCULAR | Status: AC
  Filled 2020-02-20: qty 1

## 2020-02-20 MED ORDER — KETAMINE HCL 10 MG/ML IJ SOLN
INTRAMUSCULAR | Status: DC | PRN
  Administered 2020-02-20: 20 mg via INTRAVENOUS
  Administered 2020-02-20: 10 mg via INTRAVENOUS

## 2020-02-20 MED ORDER — DROPERIDOL 2.5 MG/ML IJ SOLN: 0.6250 mg | Freq: Once | INTRAMUSCULAR | Status: DC | PRN

## 2020-02-20 MED ORDER — VASOPRESSIN 20 UNIT/ML IV SOLN
INTRAVENOUS | Status: AC
  Filled 2020-02-20: qty 1

## 2020-02-20 MED ORDER — OXYCODONE-ACETAMINOPHEN 5-325 MG PO TABS
1.0000 | ORAL_TABLET | Freq: Four times a day (QID) | ORAL | 0 refills | Status: DC | PRN
Start: 2020-02-20 — End: 2020-07-02

## 2020-02-20 MED ORDER — PROPOFOL 10 MG/ML IV BOLUS
INTRAVENOUS | Status: AC
  Filled 2020-02-20: qty 20

## 2020-02-20 MED ORDER — 0.9 % SODIUM CHLORIDE (POUR BTL) OPTIME
TOPICAL | Status: DC | PRN
  Administered 2020-02-20: 1000 mL

## 2020-02-20 MED ORDER — SODIUM CHLORIDE 0.9 % IV SOLN
INTRAVENOUS | Status: DC | PRN
  Administered 2020-02-20: 500 mL

## 2020-02-20 MED ORDER — CHLORHEXIDINE GLUCONATE 0.12 % MT SOLN
15.0000 mL | Freq: Once | OROMUCOSAL | Status: AC
  Administered 2020-02-20: 15 mL via OROMUCOSAL
  Filled 2020-02-20: qty 15

## 2020-02-20 SURGICAL SUPPLY — 43 items
ADH SKN CLS APL DERMABOND .7 (GAUZE/BANDAGES/DRESSINGS) ×1
ARMBAND PINK RESTRICT EXTREMIT (MISCELLANEOUS) ×2 IMPLANT
BAG HAMPER (MISCELLANEOUS) ×2 IMPLANT
CANNULA VESSEL 3MM 2 BLNT TIP (CANNULA) ×2 IMPLANT
CLIP LIGATING EXTRA MED SLVR (CLIP) ×2 IMPLANT
CLIP LIGATING EXTRA SM BLUE (MISCELLANEOUS) ×2 IMPLANT
COVER LIGHT HANDLE STERIS (MISCELLANEOUS) ×4 IMPLANT
COVER MAYO STAND XLG (MISCELLANEOUS) ×2 IMPLANT
COVER PROBE W GEL 5X96 (DRAPES) ×2 IMPLANT
COVER WAND RF STERILE (DRAPES) ×2 IMPLANT
DECANTER SPIKE VIAL GLASS SM (MISCELLANEOUS) ×2 IMPLANT
DERMABOND ADVANCED (GAUZE/BANDAGES/DRESSINGS) ×1
DERMABOND ADVANCED .7 DNX12 (GAUZE/BANDAGES/DRESSINGS) ×1 IMPLANT
ELECT REM PT RETURN 9FT ADLT (ELECTROSURGICAL) ×2
ELECTRODE REM PT RTRN 9FT ADLT (ELECTROSURGICAL) ×1 IMPLANT
GAUZE SPONGE 4X4 12PLY STRL (GAUZE/BANDAGES/DRESSINGS) ×2 IMPLANT
GLOVE BIOGEL PI IND STRL 7.0 (GLOVE) ×3 IMPLANT
GLOVE BIOGEL PI IND STRL 7.5 (GLOVE) ×2 IMPLANT
GLOVE BIOGEL PI INDICATOR 7.0 (GLOVE) ×3
GLOVE BIOGEL PI INDICATOR 7.5 (GLOVE) ×2
GLOVE ECLIPSE 6.5 STRL STRAW (GLOVE) ×4 IMPLANT
GLOVE ECLIPSE 7.0 STRL STRAW (GLOVE) ×2 IMPLANT
GLOVE SS BIOGEL STRL SZ 7.5 (GLOVE) ×1 IMPLANT
GLOVE SUPERSENSE BIOGEL SZ 7.5 (GLOVE) ×1
GLOVE SURG SS PI 6.5 STRL IVOR (GLOVE) ×2 IMPLANT
GOWN STRL REUS W/TWL LRG LVL3 (GOWN DISPOSABLE) ×6 IMPLANT
IV NS 500ML (IV SOLUTION) ×2
IV NS 500ML BAXH (IV SOLUTION) ×1 IMPLANT
KIT BLADEGUARD II DBL (SET/KITS/TRAYS/PACK) ×2 IMPLANT
KIT TURNOVER KIT A (KITS) ×2 IMPLANT
MANIFOLD NEPTUNE II (INSTRUMENTS) ×2 IMPLANT
MARKER SKIN DUAL TIP RULER LAB (MISCELLANEOUS) ×4 IMPLANT
NS IRRIG 1000ML POUR BTL (IV SOLUTION) ×2 IMPLANT
PACK CV ACCESS (CUSTOM PROCEDURE TRAY) ×2 IMPLANT
PAD ARMBOARD 7.5X6 YLW CONV (MISCELLANEOUS) ×4 IMPLANT
SET BASIN LINEN APH (SET/KITS/TRAYS/PACK) ×2 IMPLANT
SOL PREP POV-IOD 4OZ 10% (MISCELLANEOUS) ×2 IMPLANT
SOL PREP PROV IODINE SCRUB 4OZ (MISCELLANEOUS) ×2 IMPLANT
SUT PROLENE 6 0 CC (SUTURE) ×2 IMPLANT
SUT VIC AB 3-0 SH 27 (SUTURE) ×2
SUT VIC AB 3-0 SH 27X BRD (SUTURE) ×1 IMPLANT
TOWEL OR 17X26 4PK STRL BLUE (TOWEL DISPOSABLE) ×2 IMPLANT
UNDERPAD 30X36 HEAVY ABSORB (UNDERPADS AND DIAPERS) ×2 IMPLANT

## 2020-02-20 NOTE — Interval H&P Note (Signed)
History and Physical Interval Note:  02/20/2020 7:17 AM  Kevin Meyer  has presented today for surgery, with the diagnosis of chronic kidney disease.  The various methods of treatment have been discussed with the patient and family. After consideration of risks, benefits and other options for treatment, the patient has consented to  Procedure(s): LEFT ARM ARTERIOVENOUS (AV) FISTULA CREATION VERSUS GRAFT (Left) as a surgical intervention.  The patient's history has been reviewed, patient examined, no change in status, stable for surgery.  I have reviewed the patient's chart and labs.  Questions were answered to the patient's satisfaction.     Curt Jews

## 2020-02-20 NOTE — Anesthesia Postprocedure Evaluation (Signed)
Anesthesia Post Note  Patient: Kevin Meyer  Procedure(s) Performed: LEFT ARM ARTERIOVENOUS (AV) FISTULA CREATION (Left Arm Lower)  Patient location during evaluation: PACU Anesthesia Type: MAC Level of consciousness: awake, oriented, awake and alert and patient cooperative Pain management: pain level controlled Vital Signs Assessment: post-procedure vital signs reviewed and stable Respiratory status: spontaneous breathing, respiratory function stable and nonlabored ventilation Cardiovascular status: blood pressure returned to baseline and stable Postop Assessment: no headache and no backache Anesthetic complications: no   No complications documented.   Last Vitals:  Vitals:   02/20/20 0646 02/20/20 0717  BP: (!) 186/97 (!) 183/90  Pulse: 96   Resp: 19   Temp: 37.5 C   SpO2: 96%     Last Pain:  Vitals:   02/20/20 0646  TempSrc: Oral  PainSc: 0-No pain                 Tacy Learn

## 2020-02-20 NOTE — Op Note (Signed)
    OPERATIVE REPORT  DATE OF SURGERY: 02/20/2020  PATIENT: Kevin Meyer, 49 y.o. adult MRN: [id number]  DOB: November 21, 1970  PRE-OPERATIVE DIAGNOSIS: Chronic renal insufficiency  POST-OPERATIVE DIAGNOSIS:  Same  PROCEDURE: Left radiocephalic AV fistula  SURGEON:  Curt Jews, M.D.  PHYSICIAN ASSISTANT: Nurse  The assistant was needed for exposure and to expedite the case  ANESTHESIA: Local with sedation  EBL: per anesthesia record  Total I/O In: 100 [IV Piggyback:100] Out: -   BLOOD ADMINISTERED: none  DRAINS: none  SPECIMEN: none  COUNTS CORRECT:  YES  PATIENT DISPOSITION:  PACU - hemodynamically stable  PROCEDURE DETAILS: Patient was taken up and placed to position with area of the left arm prepped draped you sterile fashion.  Using local anesthesia incision made from the level of the cephalic vein and the radial artery at the wrist.  The patient had 2 dominant branches of the cephalic vein.  The largest of these was chosen.  The vein was ligated distally and divided.  Tributary branches were ligated with 3-0 and 4-0 silk ties and divided.  The artery was of moderate size.  He did have moderate atherosclerotic change.  The artery was occluded proximally distally with Vesseloops and was opened with an 11 blade sent longstanding with Potts scissors.  The vein was spatulated and sewn end-to-side to the artery with a running 6-0 Prolene suture.  2 dilator passed through the anastomosis without difficulty prior to closure.  Anastomosis completed and clamps were removed with an excellent thrill.  The wounds irrigated with saline.  Hemostasis drainage cautery.  Wounds were closed with 3-0 Vicryl in the subcutaneous septic tissue.  Sterile dressing was applied and the patient was transferred to the recovery room in stable condition   Rosetta Posner, M.D., [facility] 02/20/2020 9:09 AM

## 2020-02-20 NOTE — Discharge Instructions (Signed)
[facility]  Discharge Instructions  AV Fistula or Graft Surgery for Dialysis Access  Please refer to the following instructions for your post-procedure care. Your surgeon or physician assistant will discuss any changes with you.  Activity  You may drive the day following your surgery, if you are comfortable and no longer taking prescription pain medication. Resume full activity as the soreness in your incision resolves.  Bathing/Showering  You may shower after you go home. Keep your incision dry for 48 hours. Do not soak in a bathtub, hot tub, or swim until the incision heals completely. You may not shower if you have a hemodialysis catheter.  Incision Care  Clean your incision with mild soap and water after 48 hours. Pat the area dry with a clean towel. You do not need a bandage unless otherwise instructed. Do not apply any ointments or creams to your incision. You may have skin glue on your incision. Do not peel it off. It will come off on its own in about one week. Your arm may swell a bit after surgery. To reduce swelling use pillows to elevate your arm so it is above your heart. Your doctor will tell you if you need to lightly wrap your arm with an ACE bandage.  Diet  Resume your normal diet. There are not special food restrictions following this procedure. In order to heal from your surgery, it is CRITICAL to get adequate nutrition. Your body requires vitamins, minerals, and protein. Vegetables are the best source of vitamins and minerals. Vegetables also provide the perfect balance of protein. Processed food has little nutritional value, so try to avoid this.  Medications  Resume taking all of your medications. If your incision is causing pain, you may take over-the counter pain relievers such as acetaminophen (Tylenol). If you were prescribed a stronger pain medication, please be aware these medications can cause nausea and constipation. Prevent  nausea by taking the medication with a snack or meal. Avoid constipation by drinking plenty of fluids and eating foods with high amount of fiber, such as fruits, vegetables, and grains.  Do not take Tylenol if you are taking prescription pain medications.  Follow up Your surgeon may want to see you in the office following your access surgery. If so, this will be arranged at the time of your surgery.  Please call us immediately for any of the following conditions:  . Increased pain, redness, drainage (pus) from your incision site . Fever of 101 degrees or higher . Severe or worsening pain at your incision site . Hand pain or numbness. .  Reduce your risk of vascular disease:  . Stop smoking. If you would like help, call [facility] at 1-800-QUIT-NOW 256-405-9599) or [place] at 8631593198  . Manage your cholesterol . Maintain a desired weight . Control your diabetes . Keep your blood pressure down  Dialysis  It will take several weeks to several months for your new dialysis access to be ready for use. Your surgeon will determine when it is okay to use it. Your nephrologist will continue to direct your dialysis. You can continue to use your Permcath until your new access is ready for use.   02/20/2020 Kevin Meyer [id number] [birth year]  Surgeon(s): Early, Arvilla Meres, MD  Procedure(s): LEFT ARM ARTERIOVENOUS (AV) FISTULA CREATION         Do not stickfistula for 12 weeks    If you have any questions, please call the office at [phone].  Monitored Anesthesia Care, Care After These instructions provide you with information about caring for yourself after your procedure. Your health care provider may also give you more specific instructions. Your treatment has been planned according to current medical practices, but problems sometimes occur. Call your health care provider if you have any problems or questions after your procedure. What can I expect after the  procedure? After your procedure, you may:  Feel sleepy for several hours.  Feel clumsy and have poor balance for several hours.  Feel forgetful about what happened after the procedure.  Have poor judgment for several hours.  Feel nauseous or vomit.  Have a sore throat if you had a breathing tube during the procedure. Follow these instructions at home: For at least 24 hours after the procedure:      Have a responsible adult stay with you. It is important to have someone help care for you until you are awake and alert.  Rest as needed.  Do not: ? Participate in activities in which you could fall or become injured. ? Drive. ? Use heavy machinery. ? Drink alcohol. ? Take sleeping pills or medicines that cause drowsiness. ? Make important decisions or sign legal documents. ? Take care of children on your own. Eating and drinking  Follow the diet that is recommended by your health care provider.  If you vomit, drink water, juice, or soup when you can drink without vomiting.  Make sure you have little or no nausea before eating solid foods. General instructions  Take over-the-counter and prescription medicines only as told by your health care provider.  If you have sleep apnea, surgery and certain medicines can increase your risk for breathing problems. Follow instructions from your health care provider about wearing your sleep device: ? Anytime you are sleeping, including during daytime naps. ? While taking prescription pain medicines, sleeping medicines, or medicines that make you drowsy.  If you smoke, do not smoke without supervision.  Keep all follow-up visits as told by your health care provider. This is important. Contact a health care provider if:  You keep feeling nauseous or you keep vomiting.  You feel light-headed.  You develop a rash.  You have a fever. Get help right away if:  You have trouble breathing. Summary  For several hours after your  procedure, you may feel sleepy and have poor judgment.  Have a responsible adult stay with you for at least 24 hours or until you are awake and alert. This information is not intended to replace advice given to you by your health care provider. Make sure you discuss any questions you have with your health care provider. Document Revised: 07/10/2017 Document Reviewed: 08/02/2015 Elsevier Patient Education  [place].

## 2020-02-20 NOTE — Transfer of Care (Signed)
Immediate Anesthesia Transfer of Care Note  Patient: Taiquan Campanaro  Procedure(s) Performed: LEFT ARM ARTERIOVENOUS (AV) FISTULA CREATION (Left Arm Lower)  Patient Location: PACU  Anesthesia Type:MAC  Level of Consciousness: awake, alert , oriented and patient cooperative  Airway & Oxygen Therapy: Patient Spontanous Breathing  Post-op Assessment: Report given to RN, Post -op Vital signs reviewed and stable and Patient moving all extremities  Post vital signs: Reviewed and stable  Last Vitals:  Vitals Value Taken Time  BP    Temp    Pulse 88 02/20/20 0900  Resp 19 02/20/20 0900  SpO2 96 % 02/20/20 0900  Vitals shown include unvalidated device data.  Last Pain:  Vitals:   02/20/20 0646  TempSrc: Oral  PainSc: 0-No pain         Complications: No complications documented.

## 2020-02-20 NOTE — Anesthesia Preprocedure Evaluation (Signed)
Anesthesia Evaluation  Patient identified by MRN, date of birth, ID band Patient awake    Reviewed: Allergy & Precautions, NPO status , Patient's Chart, lab work & pertinent test results, reviewed documented beta blocker date and time   Airway Mallampati: III  TM Distance: >3 FB Neck ROM: Full    Dental no notable dental hx.    Pulmonary neg pulmonary ROS,    Pulmonary exam normal breath sounds clear to auscultation       Cardiovascular hypertension, Normal cardiovascular exam Rhythm:Regular Rate:Normal     Neuro/Psych    GI/Hepatic   Endo/Other  diabetes, Type 2  Renal/GU ESRFRenal disease     Musculoskeletal   Abdominal   Peds  Hematology  (+) anemia ,   Anesthesia Other Findings   Reproductive/Obstetrics                             Anesthesia Physical Anesthesia Plan  ASA: III  Anesthesia Plan: MAC   Post-op Pain Management:    Induction: Intravenous  PONV Risk Score and Plan:   Airway Management Planned: Nasal Cannula  Additional Equipment:   Intra-op Plan:   Post-operative Plan:   Informed Consent: I have reviewed the patients History and Physical, chart, labs and discussed the procedure including the risks, benefits and alternatives for the proposed anesthesia with the patient or authorized representative who has indicated his/her understanding and acceptance.     Dental advisory given  Plan Discussed with: CRNA  Anesthesia Plan Comments:         Anesthesia Quick Evaluation

## 2020-02-21 ENCOUNTER — Encounter (HOSPITAL_COMMUNITY): Payer: Self-pay | Admitting: Vascular Surgery

## 2020-03-16 ENCOUNTER — Ambulatory Visit (HOSPITAL_COMMUNITY)
Admission: RE | Admit: 2020-03-16 | Discharge: 2020-03-16 | Disposition: A | Discharge status: Home / Self Care | Payer: Medicaid Other | Source: Ambulatory Visit | Attending: Vascular Surgery | Admitting: Vascular Surgery

## 2020-03-16 ENCOUNTER — Other Ambulatory Visit: Payer: Self-pay

## 2020-03-16 DIAGNOSIS — N183 Chronic kidney disease, stage 3 unspecified: Secondary | ICD-10-CM | POA: Diagnosis not present

## 2020-03-23 ENCOUNTER — Other Ambulatory Visit: Payer: Self-pay

## 2020-03-23 ENCOUNTER — Ambulatory Visit (INDEPENDENT_AMBULATORY_CARE_PROVIDER_SITE_OTHER): Payer: Self-pay | Admitting: Vascular Surgery

## 2020-03-23 ENCOUNTER — Encounter: Payer: Self-pay | Admitting: Vascular Surgery

## 2020-03-23 VITALS — BP 163/83 | HR 86 | Temp 98.3°F | Resp 16 | Ht 66.0 in | Wt 249.0 lb

## 2020-03-23 DIAGNOSIS — N183 Chronic kidney disease, stage 3 unspecified: Secondary | ICD-10-CM

## 2020-03-23 NOTE — Progress Notes (Signed)
[facility] [place]  Patient name: Kevin Meyer MRN: [id number] DOB: 1971/03/02 Sex: adult  REASON FOR VISIT: Follow-up left radiocephalic AV fistula creation on 02/20/2020  HPI: Kevin Meyer is a 49 y.o. adult here today for follow-up.  He underwent AV fistula creation on 02/20/2020 at [facility].  He has done well with no difficulty with wound healing and no steal symptoms.  He reports that his renal function has remained stable with no imminent plans for hemodialysis  Current Outpatient Medications  Medication Sig Dispense Refill  . allopurinol (ZYLOPRIM) 100 MG tablet Take 100 mg by mouth daily.    Marland Kitchen aspirin EC 81 MG tablet Take 81 mg by mouth daily.    Marland Kitchen atorvastatin (LIPITOR) 40 MG tablet Take 40 mg by mouth daily.    . Cholecalciferol (VITAMIN D) 50 MCG (2000 UT) CAPS Take 2,000 Units by mouth daily.    . Dulaglutide (TRULICITY) 4.69 GE/9.5MW SOPN Inject 0.75 mg into the skin every 7 (seven) days.    . furosemide (LASIX) 40 MG tablet Take 0.5 tablets (20 mg total) by mouth 2 (two) times daily. Patient take 40mg  in the morning and 20mg  at 1200pm (Patient taking differently: Take 40 mg by mouth 2 (two) times daily. ) 30 tablet   . glipiZIDE (GLUCOTROL) 10 MG tablet Take 1 tablet (10 mg total) by mouth 2 (two) times daily before a meal.    . hydrALAZINE (APRESOLINE) 25 MG tablet Take 50 mg by mouth in the morning and at bedtime.    Marland Kitchen labetalol (NORMODYNE) 100 MG tablet Take 2 tablets (200 mg total) by mouth 2 (two) times daily. (Patient taking differently: Take 400 mg by mouth 2 (two) times daily. ) 120 tablet 1  . lovastatin (MEVACOR) 20 MG tablet Take 20 mg by mouth at bedtime.     Marland Kitchen oxyCODONE-acetaminophen (PERCOCET) 5-325 MG tablet Take 1 tablet by mouth every 6 (six) hours as needed for severe pain. 8 tablet 0   No current facility-administered medications for this visit.     PHYSICAL EXAM: Vitals:   03/23/20 1328   BP: (!) 163/83  Pulse: 86  Resp: 16  Temp: 98.3 F (36.8 C)  TempSrc: Other (Comment)  SpO2: 97%  Weight: 249 lb (112.9 kg)  Height: 5\' 6"  (1.676 m)    GENERAL: The patient is a well-nourished adult, in no acute distress. The vital signs are documented above. His wrist incision is completely healed.  He does have an excellent thrill at the level of the radiocephalic anastomosis.  His cephalic vein is palpable throughout his upper arm  Duplex of his fistula at [facility] radiology on 03/16/2020 was reviewed.  This reveals diameter of his cephalic fistula in the 4 mm range throughout its course.  The vein does run in a straight course  MEDICAL ISSUES: I discussed that his findings with the patient.  Explained that we are 1 month out.  Explained that we would like to have a minimum of 3 months prior to access.  It does not appear this will be an issue since his renal function is relatively stable.  I did also explain that his vein will need to get somewhat larger to be successful for long-term hemodialysis.  He will continue to follow-up with Dr. Dickey Gave.  If he approaches dialysis and his vein is questionable.  We would see him again in our office for follow-up.   Rosetta Posner, MD FACS [facility]  [place] Office Tel [phone]

## 2020-03-31 ENCOUNTER — Other Ambulatory Visit (HOSPITAL_COMMUNITY): Payer: Self-pay | Admitting: Physician Assistant

## 2020-03-31 DIAGNOSIS — R059 Cough, unspecified: Secondary | ICD-10-CM

## 2020-06-15 ENCOUNTER — Encounter (HOSPITAL_COMMUNITY): Payer: Self-pay

## 2020-06-15 ENCOUNTER — Other Ambulatory Visit: Payer: Self-pay

## 2020-06-15 ENCOUNTER — Encounter (HOSPITAL_COMMUNITY)
Admission: RE | Admit: 2020-06-15 | Discharge: 2020-06-15 | Disposition: A | Discharge status: Home / Self Care | Payer: Medicaid Other | Source: Ambulatory Visit | Attending: Nephrology | Admitting: Nephrology

## 2020-06-15 ENCOUNTER — Encounter: Payer: Self-pay | Admitting: Internal Medicine

## 2020-06-15 DIAGNOSIS — D509 Iron deficiency anemia, unspecified: Secondary | ICD-10-CM | POA: Insufficient documentation

## 2020-06-15 LAB — POCT HEMOGLOBIN-HEMACUE: Hemoglobin: 7.5 g/dL — ABNORMAL LOW (ref 13.0–17.0)

## 2020-06-15 MED ORDER — SODIUM CHLORIDE 0.9 % IV SOLN
510.0000 mg | Freq: Once | INTRAVENOUS | Status: AC
  Administered 2020-06-15: 510 mg via INTRAVENOUS
  Filled 2020-06-15: qty 17

## 2020-06-15 MED ORDER — SODIUM CHLORIDE 0.9 % IV SOLN: Freq: Once | INTRAVENOUS | Status: AC

## 2020-06-15 MED ORDER — EPOETIN ALFA-EPBX 3000 UNIT/ML IJ SOLN
3000.0000 [IU] | Freq: Once | INTRAMUSCULAR | Status: AC
  Administered 2020-06-15: 3000 [IU] via SUBCUTANEOUS
  Filled 2020-06-15: qty 1

## 2020-06-22 ENCOUNTER — Emergency Department (HOSPITAL_COMMUNITY): Payer: Medicaid Other

## 2020-06-22 ENCOUNTER — Inpatient Hospital Stay (HOSPITAL_COMMUNITY)
Admission: EM | Admit: 2020-06-22 | Discharge: 2020-06-29 | DRG: 291 | Disposition: A | Discharge status: Home / Self Care | Payer: Medicaid Other | Attending: Internal Medicine | Admitting: Internal Medicine

## 2020-06-22 ENCOUNTER — Other Ambulatory Visit: Payer: Self-pay

## 2020-06-22 ENCOUNTER — Ambulatory Visit: Payer: Medicaid Other | Admitting: Vascular Surgery

## 2020-06-22 ENCOUNTER — Encounter (HOSPITAL_COMMUNITY): Payer: Self-pay | Admitting: Emergency Medicine

## 2020-06-22 DIAGNOSIS — D509 Iron deficiency anemia, unspecified: Secondary | ICD-10-CM | POA: Diagnosis present

## 2020-06-22 DIAGNOSIS — E7800 Pure hypercholesterolemia, unspecified: Secondary | ICD-10-CM | POA: Diagnosis present

## 2020-06-22 DIAGNOSIS — Z833 Family history of diabetes mellitus: Secondary | ICD-10-CM

## 2020-06-22 DIAGNOSIS — E1122 Type 2 diabetes mellitus with diabetic chronic kidney disease: Secondary | ICD-10-CM | POA: Diagnosis present

## 2020-06-22 DIAGNOSIS — E782 Mixed hyperlipidemia: Secondary | ICD-10-CM

## 2020-06-22 DIAGNOSIS — R7989 Other specified abnormal findings of blood chemistry: Secondary | ICD-10-CM

## 2020-06-22 DIAGNOSIS — Z7984 Long term (current) use of oral hypoglycemic drugs: Secondary | ICD-10-CM | POA: Diagnosis not present

## 2020-06-22 DIAGNOSIS — N179 Acute kidney failure, unspecified: Secondary | ICD-10-CM

## 2020-06-22 DIAGNOSIS — I313 Pericardial effusion (noninflammatory): Secondary | ICD-10-CM | POA: Diagnosis present

## 2020-06-22 DIAGNOSIS — I132 Hypertensive heart and chronic kidney disease with heart failure and with stage 5 chronic kidney disease, or end stage renal disease: Principal | ICD-10-CM | POA: Diagnosis present

## 2020-06-22 DIAGNOSIS — Z823 Family history of stroke: Secondary | ICD-10-CM

## 2020-06-22 DIAGNOSIS — E1165 Type 2 diabetes mellitus with hyperglycemia: Secondary | ICD-10-CM | POA: Diagnosis present

## 2020-06-22 DIAGNOSIS — I5033 Acute on chronic diastolic (congestive) heart failure: Secondary | ICD-10-CM | POA: Diagnosis present

## 2020-06-22 DIAGNOSIS — Z888 Allergy status to other drugs, medicaments and biological substances status: Secondary | ICD-10-CM

## 2020-06-22 DIAGNOSIS — N189 Chronic kidney disease, unspecified: Secondary | ICD-10-CM | POA: Diagnosis not present

## 2020-06-22 DIAGNOSIS — D519 Vitamin B12 deficiency anemia, unspecified: Secondary | ICD-10-CM | POA: Diagnosis present

## 2020-06-22 DIAGNOSIS — M1990 Unspecified osteoarthritis, unspecified site: Secondary | ICD-10-CM | POA: Diagnosis present

## 2020-06-22 DIAGNOSIS — Z6841 Body Mass Index (BMI) 40.0 and over, adult: Secondary | ICD-10-CM | POA: Diagnosis not present

## 2020-06-22 DIAGNOSIS — G4730 Sleep apnea, unspecified: Secondary | ICD-10-CM | POA: Diagnosis present

## 2020-06-22 DIAGNOSIS — Z992 Dependence on renal dialysis: Secondary | ICD-10-CM | POA: Diagnosis not present

## 2020-06-22 DIAGNOSIS — M109 Gout, unspecified: Secondary | ICD-10-CM | POA: Diagnosis present

## 2020-06-22 DIAGNOSIS — I358 Other nonrheumatic aortic valve disorders: Secondary | ICD-10-CM | POA: Diagnosis present

## 2020-06-22 DIAGNOSIS — D649 Anemia, unspecified: Secondary | ICD-10-CM

## 2020-06-22 DIAGNOSIS — I10 Essential (primary) hypertension: Secondary | ICD-10-CM | POA: Diagnosis not present

## 2020-06-22 DIAGNOSIS — Z20822 Contact with and (suspected) exposure to covid-19: Secondary | ICD-10-CM | POA: Diagnosis present

## 2020-06-22 DIAGNOSIS — E785 Hyperlipidemia, unspecified: Secondary | ICD-10-CM | POA: Diagnosis present

## 2020-06-22 DIAGNOSIS — E872 Acidosis: Secondary | ICD-10-CM | POA: Diagnosis present

## 2020-06-22 DIAGNOSIS — Z79899 Other long term (current) drug therapy: Secondary | ICD-10-CM

## 2020-06-22 DIAGNOSIS — Z7982 Long term (current) use of aspirin: Secondary | ICD-10-CM

## 2020-06-22 DIAGNOSIS — R0989 Other specified symptoms and signs involving the circulatory and respiratory systems: Secondary | ICD-10-CM | POA: Diagnosis present

## 2020-06-22 DIAGNOSIS — D631 Anemia in chronic kidney disease: Secondary | ICD-10-CM | POA: Diagnosis present

## 2020-06-22 DIAGNOSIS — N186 End stage renal disease: Secondary | ICD-10-CM | POA: Diagnosis present

## 2020-06-22 DIAGNOSIS — R0902 Hypoxemia: Secondary | ICD-10-CM | POA: Diagnosis present

## 2020-06-22 DIAGNOSIS — I5021 Acute systolic (congestive) heart failure: Secondary | ICD-10-CM | POA: Diagnosis not present

## 2020-06-22 DIAGNOSIS — I509 Heart failure, unspecified: Secondary | ICD-10-CM

## 2020-06-22 LAB — CBC WITH DIFFERENTIAL/PLATELET
Abs Immature Granulocytes: 0.04 10*3/uL (ref 0.00–0.07)
Basophils Absolute: 0 10*3/uL (ref 0.0–0.1)
Basophils Relative: 0 %
Eosinophils Absolute: 0.1 10*3/uL (ref 0.0–0.5)
Eosinophils Relative: 2 %
HCT: 25 % — ABNORMAL LOW (ref 39.0–52.0)
Hemoglobin: 7.3 g/dL — ABNORMAL LOW (ref 13.0–17.0)
Immature Granulocytes: 1 %
Lymphocytes Relative: 10 %
Lymphs Abs: 0.7 10*3/uL (ref 0.7–4.0)
MCH: 27.5 pg (ref 26.0–34.0)
MCHC: 29.2 g/dL — ABNORMAL LOW (ref 30.0–36.0)
MCV: 94.3 fL (ref 80.0–100.0)
Monocytes Absolute: 0.5 10*3/uL (ref 0.1–1.0)
Monocytes Relative: 7 %
Neutro Abs: 5.3 10*3/uL (ref 1.7–7.7)
Neutrophils Relative %: 80 %
Platelets: 162 10*3/uL (ref 150–400)
RBC: 2.65 MIL/uL — ABNORMAL LOW (ref 4.22–5.81)
RDW: 16.4 % — ABNORMAL HIGH (ref 11.5–15.5)
WBC: 6.7 10*3/uL (ref 4.0–10.5)
nRBC: 0 % (ref 0.0–0.2)

## 2020-06-22 LAB — URINALYSIS, ROUTINE W REFLEX MICROSCOPIC
Bilirubin Urine: NEGATIVE
Glucose, UA: 50 mg/dL — AB
Hgb urine dipstick: NEGATIVE
Ketones, ur: NEGATIVE mg/dL
Leukocytes,Ua: NEGATIVE
Nitrite: NEGATIVE
Protein, ur: 100 mg/dL — AB
Specific Gravity, Urine: 1.012 (ref 1.005–1.030)
pH: 5 (ref 5.0–8.0)

## 2020-06-22 LAB — COMPREHENSIVE METABOLIC PANEL
ALT: 16 U/L (ref 0–44)
AST: 14 U/L — ABNORMAL LOW (ref 15–41)
Albumin: 3.4 g/dL — ABNORMAL LOW (ref 3.5–5.0)
Alkaline Phosphatase: 53 U/L (ref 38–126)
Anion gap: 16 — ABNORMAL HIGH (ref 5–15)
BUN: 92 mg/dL — ABNORMAL HIGH (ref 6–20)
CO2: 16 mmol/L — ABNORMAL LOW (ref 22–32)
Calcium: 7.5 mg/dL — ABNORMAL LOW (ref 8.9–10.3)
Chloride: 104 mmol/L (ref 98–111)
Creatinine, Ser: 6.71 mg/dL — ABNORMAL HIGH (ref 0.61–1.24)
GFR, Estimated: 9 mL/min — ABNORMAL LOW (ref >60)
Glucose, Bld: 212 mg/dL — ABNORMAL HIGH (ref 70–99)
Potassium: 4.6 mmol/L (ref 3.5–5.1)
Sodium: 136 mmol/L (ref 135–145)
Total Bilirubin: 0.3 mg/dL (ref 0.3–1.2)
Total Protein: 6.8 g/dL (ref 6.5–8.1)

## 2020-06-22 LAB — RESP PANEL BY RT-PCR (FLU A&B, COVID) ARPGX2
Influenza A by PCR: NEGATIVE
Influenza B by PCR: NEGATIVE
SARS Coronavirus 2 by RT PCR: NEGATIVE

## 2020-06-22 LAB — BRAIN NATRIURETIC PEPTIDE: B Natriuretic Peptide: 2080 pg/mL — ABNORMAL HIGH (ref 0.0–100.0)

## 2020-06-22 MED ORDER — FUROSEMIDE 10 MG/ML IJ SOLN
40.0000 mg | Freq: Once | INTRAMUSCULAR | Status: AC
  Administered 2020-06-22: 40 mg via INTRAVENOUS
  Filled 2020-06-22: qty 4

## 2020-06-22 MED ORDER — FUROSEMIDE 10 MG/ML IJ SOLN
40.0000 mg | Freq: Two times a day (BID) | INTRAMUSCULAR | Status: DC
  Filled 2020-06-22: qty 4

## 2020-06-22 MED ORDER — INSULIN ASPART 100 UNIT/ML ~~LOC~~ SOLN
0.0000 [IU] | Freq: Three times a day (TID) | SUBCUTANEOUS | Status: DC
  Administered 2020-06-23: 1 [IU] via SUBCUTANEOUS
  Administered 2020-06-25 (×2): 2 [IU] via SUBCUTANEOUS
  Administered 2020-06-25: 1 [IU] via SUBCUTANEOUS
  Administered 2020-06-26 (×3): 2 [IU] via SUBCUTANEOUS
  Administered 2020-06-27: 1 [IU] via SUBCUTANEOUS
  Administered 2020-06-27: 2 [IU] via SUBCUTANEOUS
  Administered 2020-06-27: 1 [IU] via SUBCUTANEOUS
  Administered 2020-06-28: 2 [IU] via SUBCUTANEOUS
  Administered 2020-06-28: 1 [IU] via SUBCUTANEOUS
  Administered 2020-06-28: 2 [IU] via SUBCUTANEOUS
  Administered 2020-06-29 (×2): 1 [IU] via SUBCUTANEOUS

## 2020-06-22 MED ORDER — NITROGLYCERIN 2 % TD OINT
0.5000 [in_us] | TOPICAL_OINTMENT | Freq: Once | TRANSDERMAL | Status: AC
  Administered 2020-06-22: 0.5 [in_us] via TOPICAL
  Filled 2020-06-22: qty 1

## 2020-06-22 MED ORDER — INSULIN ASPART 100 UNIT/ML ~~LOC~~ SOLN: 0.0000 [IU] | Freq: Every day | SUBCUTANEOUS | Status: DC

## 2020-06-22 MED ORDER — HYDRALAZINE HCL 20 MG/ML IJ SOLN
10.0000 mg | INTRAMUSCULAR | Status: AC
  Administered 2020-06-22: 10 mg via INTRAVENOUS
  Filled 2020-06-22: qty 1

## 2020-06-22 MED ORDER — HEPARIN SODIUM (PORCINE) 5000 UNIT/ML IJ SOLN
5000.0000 [IU] | Freq: Three times a day (TID) | INTRAMUSCULAR | Status: DC
  Administered 2020-06-23 – 2020-06-29 (×17): 5000 [IU] via SUBCUTANEOUS
  Filled 2020-06-22 (×17): qty 1

## 2020-06-22 NOTE — ED Triage Notes (Signed)
Pt c/o sob with exertion and pitting edema.

## 2020-06-22 NOTE — H&P (Signed)
History and Physical  Kevin Meyer ZOX:[id number] DOB: 07-31-1970 DOA: 06/22/2020  Referring physician: Carmin Muskrat PCP: Raiford Simmonds., PA-C  Patient coming from: Home  Chief Complaint: Shortness of breath  HPI: Kevin Meyer is a 50 y.o. male with medical history significant for hypertension, hyperlipidemia, T2DM, CKD stage IV who presents to the emergency department due to 3-4 week onset of progressive shortness of breath. Patient states that he could barely walk 20 feet without being short of breath when symptoms first started a few weeks ago, he states that he could barely walk 10 feet now without being short of breath. Patient states that he could not lay flat in bed since last 3 to 4 weeks and he also complained of increased leg swelling, abdominal girth during this same time period. He denies chest pain, fever, chills, nausea or vomiting. Patient has left upper extremity fistula in place, but he has not been started on dialysis.  ED Course: In the emergency department, he was tachypneic and tachycardic. Work-up in the ED showed normocytic anemia, BUN/creatinine 92/6.71 (this was 4.44 about 4 months ago), eGFR 9, CBG 212, BNP 2080. Chest x-ray showed volume overload, with central vascular congestion and small bilateral pleural effusions. IV Lasix 40 Mg x1 was given, nitroglycerin and IV hydralazine 10 Mg times all were given. Hospitalist was asked to admit patient for further evaluation and management.  Review of Systems: Constitutional: Negative for chills and fever.  HENT: Negative for ear pain and sore throat.   Eyes: Negative for pain and visual disturbance.  Respiratory: Positive for shortness of breath. Negative for cough Cardiovascular: Negative for chest pain and palpitations.  Gastrointestinal: Positive for increased abdominal girth. Negative for abdominal pain and vomiting.  Endocrine: Negative for polyphagia and polyuria.  Genitourinary: Negative for decreased urine  volume, dysuria, enuresis Musculoskeletal: Positive for increased leg swelling. Negative for arthralgias and back pain.  Skin: Negative for color change and rash.  Allergic/Immunologic: Negative for immunocompromised state.  Neurological: Negative for tremors, syncope, speech difficulty Hematological: Does not bruise/bleed easily.  All other systems reviewed and are negative   Past Medical History:  Diagnosis Date  . Anemia   . Diabetes mellitus without complication ([place])   . Gout   . High cholesterol   . Hypertension   . Osteoarthritis   . Renal disorder    Past Surgical History:  Procedure Laterality Date  . AV FISTULA PLACEMENT Left 02/20/2020   Procedure: LEFT ARM ARTERIOVENOUS (AV) FISTULA CREATION;  Surgeon: Rosetta Posner, MD;  Location: [facility];  Service: Vascular;  Laterality: Left;  . NO PAST SURGERIES      Social History:  reports that he has never smoked. He has never used smokeless tobacco. He reports that he does not drink alcohol and does not use drugs.   Allergies  Allergen Reactions  . Gabapentin Anaphylaxis  . Lyrica [Pregabalin] Anaphylaxis    Family History  Problem Relation Age of Onset  . Stroke Mother   . Diabetes Other       Prior to Admission medications   Medication Sig Start Date End Date Taking? Authorizing Provider  allopurinol (ZYLOPRIM) 100 MG tablet Take 100 mg by mouth daily.   Yes [provider]  aspirin EC 81 MG tablet Take 81 mg by mouth daily.   Yes [provider]  atorvastatin (LIPITOR) 40 MG tablet Take 40 mg by mouth daily.   Yes [provider]  calcitRIOL (ROCALTROL) 0.25 MCG capsule Take 0.25 mcg by  mouth daily. 06/10/20 06/10/21 Yes [provider]  calcium acetate (PHOSLO) 667 MG capsule Take 667 mg by mouth 3 (three) times daily. 06/10/20  Yes [provider]  chlorpheniramine (CHLOR-TRIMETON) 4 MG tablet Take 4 mg by mouth every 6 (six) hours as needed. 04/02/20  Yes [provider]  Cholecalciferol (VITAMIN D) 50 MCG (2000 UT) CAPS Take 2,000 Units by mouth daily.   Yes [provider]  diclofenac Sodium (VOLTAREN) 1 % GEL Apply 1 application topically 4 (four) times daily. 02/19/20  Yes [provider]  Dulaglutide (TRULICITY) 6.73 AL/9.3XT SOPN Inject 0.75 mg into the skin every 7 (seven) days.   Yes [provider]  epoetin alfa-epbx (RETACRIT) 3000 UNIT/ML injection 3,000 Units every 14 (fourteen) days. 06/15/20  Yes Bhutani, Manpreet S, MD  furosemide (LASIX) 40 MG tablet Take 0.5 tablets (20 mg total) by mouth 2 (two) times daily. Patient take 13m in the morning and 28mat 1200pm Patient taking differently: Take 40 mg by mouth 2 (two) times daily. 09/10/17  Yes MaBarton DuboisMD  glipiZIDE (GLUCOTROL) 10 MG tablet Take 1 tablet (10 mg total) by mouth 2 (two) times daily before a meal. 08/26/17  Yes MaBarton DuboisMD  hydrALAZINE (APRESOLINE) 25 MG tablet Take 50 mg by mouth in the morning and at bedtime.   Yes [provider]  labetalol (NORMODYNE) 100 MG tablet Take 2 tablets (200 mg total) by mouth 2 (two) times daily. Patient taking differently: Take 400 mg by mouth 2 (two) times daily. 09/10/17  Yes MaBarton DuboisMD  lovastatin (MEVACOR) 20 MG tablet Take 20 mg by mouth at bedtime.    Yes [provider]  oxyCODONE-acetaminophen (PERCOCET) 5-325 MG tablet Take 1 tablet by mouth every 6 (six) hours as needed for severe pain. Patient not taking: No sig reported 02/20/20   EaRosetta PosnerMD    Physical Exam: BP (!) 148/54 (BP Location: Right Wrist)   Pulse (!) 103   Temp 98 F (36.7 C) (Oral)   Resp (!) 27   Ht 5' 6" (1.676 m)   SpO2 99%   BMI 45.52 kg/m   . General: [age].o. year-old male obese, but in no acute distress.  Alert and oriented x3. . Marland KitchenEENT: NCAT, EOMI . Neck: Supple, trachea medial . Cardiovascular: Tachycardia. Regular rhythm with no rubs or gallops.  No thyromegaly or JVD noted. 2/4  pulses in all 4 extremities. . Marland Kitchenespiratory: Tachypnea. Clear to auscultation with no wheezes or rales. . Abdomen: Soft nontender nondistended with normal bowel sounds x4 quadrants. . Muskuloskeletal: +2 edema bilaterally in lower extremities. No cyanosis or clubbing . Neuro: CN II-XII intact, no focal neurological deficit, sensation, reflexes intact . Skin: No ulcerative lesions noted or rashes . Psychiatry: Mood is appropriate for condition and setting          Labs on Admission:  Basic Metabolic Panel: Recent Labs  Lab 06/22/20 2057  NA 136  K 4.6  CL 104  CO2 16*  GLUCOSE 212*  BUN 92*  CREATININE 6.71*  CALCIUM 7.5*   Liver Function Tests: Recent Labs  Lab 06/22/20 2057  AST 14*  ALT 16  ALKPHOS 53  BILITOT 0.3  PROT 6.8  ALBUMIN 3.4*   No results for input(s): LIPASE, AMYLASE in the last 168 hours. No results for input(s): AMMONIA in the last 168 hours. CBC: Recent Labs  Lab 06/22/20 2057  WBC 6.7  NEUTROABS 5.3  HGB 7.3*  HCT 25.0*  MCV 94.3  PLT 162   Cardiac Enzymes: No results for input(s): CKTOTAL, CKMB, CKMBINDEX, TROPONINI in the last 168 hours.  BNP (last 3 results) Recent Labs    06/22/20 2057  BNP 2,080.0*    ProBNP (last 3 results) No results for input(s): PROBNP in the last 8760 hours.  CBG: No results for input(s): GLUCAP in the last 168 hours.  Radiological Exams on Admission: DG Chest Port 1 View  Result Date: 06/22/2020 CLINICAL DATA:  Dyspnea on exertion, lower extremity edema for 2 weeks EXAM: PORTABLE CHEST 1 VIEW COMPARISON:  None. FINDINGS: Single frontal view of the chest demonstrates an enlarged cardiac silhouette. There is central vascular congestion, with small bilateral pleural effusions. No pneumothorax. IMPRESSION: 1. Volume overload, with central vascular congestion and small bilateral pleural effusions. Electronically Signed   By: Randa Ngo M.D.   On: 06/22/2020 21:15    EKG: I independently viewed the EKG  done and my findings are as followed: Sinus tachycardia at rate of 103 bpm with nonspecific T wave abnormalities  Assessment/Plan Present on Admission: . Hypertension . Hyperlipidemia  Principal Problem:   CHF (congestive heart failure) (HCC) Active Problems:   Hypertension   Acute kidney injury superimposed on CKD (HCC)   Hyperlipidemia   Elevated brain natriuretic peptide (BNP) level   Normocytic anemia   Hyperglycemia due to diabetes mellitus ([place])  Presumed new onset CHF Elevated BNP This is possibly due to cardiorenal effect Patient presents with 3-4-week onset of progressive dyspnea on exertion, peripheral edema with increased weight gain. Chest x-ray  showed volume overload, with central vascular congestion and small bilateral pleural effusions. BNP 2080 He was started on IV Lasix 40 mg in the ED,only 336m of urine output was obtained.  Another IV Lasix 40 mg was given we shall continue with IV Lasix 40 mg twice daily Continue total input/output, daily weights and fluid restriction Continue Cardiac diet  EKG personally reviewed showed sinus tachycardia at a rate of 103 bpm with nonspecific T wave abnormalities Echocardiogram will be done in the morning   Acute kidney injury superimposed on CKD stage V BUN/creatinine 92/6.71 (this was 4.44 about 4 months ago), Patient has LUE fistula, but was yet to start dialysis.  Nephrologist was consulted and will see patient in the morning for possible dialysis per ED visit  Hyperglycemia secondary to T2DM Continue ISS and hypoglycemic protocol Glipizide will be held at this time  Essential hypertension Continue IV Lasix  Hyperlipidemia  Continue Lipitor  Normocytic anemia H/H 7.3/25.0, MCV 94.3 this is possibly due to anemia of inflammation Patient takes Retacrit 3000 units every 14 days per home regimen Continue to monitor CBC with morning labs   DVT prophylaxis: Heparin subcu  Code Status: Full code  Family  Communication: None at bedside  Disposition Plan:  Patient is from:                        home Anticipated DC to:                   SNF or family members home Anticipated DC date:               2-3 days Anticipated DC barriers:          Patient needs inpatient management of presumed new onset CHF and requiring neurology consult for possible initiation of hemodialysis  Consults called: Nephrology  Admission status: Inpatient  OBernadette HoitMD [facility]  06/22/2020, 11:33 PM

## 2020-06-22 NOTE — ED Provider Notes (Signed)
[facility] EMERGENCY DEPARTMENT Provider Note   CSN: [id number] Arrival date & time: 06/22/20  2038     History Chief Complaint  Patient presents with  . Shortness of Breath    Kevin Meyer is a 50 y.o. adult.  HPI Patient presents with dyspnea, swelling, fatigue. Onset was a few weeks ago, without clear precipitant.  Now, over the past few weeks patient has had progressive dyspnea, both at rest at with activity.  Whereas before he was able to ambulate normally, now with minimal exertion he is dyspneic, though with no chest pain either at rest or with exertion. No syncope, no nausea, vomiting, fever. He has a notable history of kidney disease, has left upper arm graft in place, but has not started dialysis. He has received his Covid vaccines.    Past Medical History:  Diagnosis Date  . Anemia   . Diabetes mellitus without complication ([place])   . Gout   . High cholesterol   . Hypertension   . Osteoarthritis   . Renal disorder     Patient Active Problem List   Diagnosis Date Noted  . Osteoarthritis 10/10/2017  . Stage 3 chronic kidney disease ([place]) 10/10/2017  . Type 2 diabetes with nephropathy ([place])   . Acute renal failure superimposed on stage 3 chronic kidney disease ([place])   . Class 2 obesity due to excess calories with body mass index (BMI) of 39.0 to 39.9 in adult   . Hyperlipidemia   . Hyperkalemia 08/25/2017  . Renal disorder   . Hypertension   . AKI (acute kidney injury) [facility])     Past Surgical History:  Procedure Laterality Date  . AV FISTULA PLACEMENT Left 02/20/2020   Procedure: LEFT ARM ARTERIOVENOUS (AV) FISTULA CREATION;  Surgeon: Rosetta Posner, MD;  Location: [facility] ORS;  Service: Vascular;  Laterality: Left;  . NO PAST SURGERIES       OB History   No obstetric history on file.     Family History  Problem Relation Age of Onset  . Stroke Mother   . Diabetes Other     Social History   Tobacco Use  . Smoking status: Never Smoker  . Smokeless  tobacco: Never Used  Vaping Use  . Vaping Use: Never used  Substance Use Topics  . Alcohol use: Never    Comment: occasional  . Drug use: Never    Home Medications Prior to Admission medications   Medication Sig Start Date End Date Taking? Authorizing Provider  allopurinol (ZYLOPRIM) 100 MG tablet Take 100 mg by mouth daily.   Yes [provider]  aspirin EC 81 MG tablet Take 81 mg by mouth daily.   Yes [provider]  atorvastatin (LIPITOR) 40 MG tablet Take 40 mg by mouth daily.   Yes [provider]  Cholecalciferol (VITAMIN D) 50 MCG (2000 UT) CAPS Take 2,000 Units by mouth daily.   Yes [provider]  Dulaglutide (TRULICITY) A999333 0000000 SOPN Inject 0.75 mg into the skin every 7 (seven) days.   Yes [provider]  epoetin alfa-epbx (RETACRIT) 3000 UNIT/ML injection 3,000 Units every 14 (fourteen) days. 06/15/20  Yes Bhutani, Manpreet S, MD  furosemide (LASIX) 40 MG tablet Take 0.5 tablets (20 mg total) by mouth 2 (two) times daily. Patient take '40mg'$  in the morning and '20mg'$  at 1200pm Patient taking differently: Take 40 mg by mouth 2 (two) times daily. 09/10/17  Yes Barton Dubois, MD  glipiZIDE (GLUCOTROL) 10 MG tablet Take 1 tablet (  10 mg total) by mouth 2 (two) times daily before a meal. 08/26/17  Yes Barton Dubois, MD  hydrALAZINE (APRESOLINE) 25 MG tablet Take 50 mg by mouth in the morning and at bedtime.   Yes [provider]  labetalol (NORMODYNE) 100 MG tablet Take 2 tablets (200 mg total) by mouth 2 (two) times daily. Patient taking differently: Take 400 mg by mouth 2 (two) times daily. 09/10/17  Yes Barton Dubois, MD  lovastatin (MEVACOR) 20 MG tablet Take 20 mg by mouth at bedtime.    Yes [provider]  oxyCODONE-acetaminophen (PERCOCET) 5-325 MG tablet Take 1 tablet by mouth every 6 (six) hours as needed for severe pain. Patient not taking: Reported on 06/22/2020 02/20/20   Rosetta Posner, MD    Allergies     Gabapentin and Lyrica [pregabalin]  Review of Systems   Review of Systems  Constitutional:       Per HPI, otherwise negative  HENT:       Per HPI, otherwise negative  Respiratory:       Per HPI, otherwise negative  Cardiovascular:       Per HPI, otherwise negative  Gastrointestinal: Negative for vomiting.  Endocrine:       Negative aside from HPI  Genitourinary:       Neg aside from HPI   Musculoskeletal:       Per HPI, otherwise negative  Skin: Negative.   Neurological: Negative for syncope.    Physical Exam Updated Vital Signs BP (!) 163/90   Pulse (!) 105   Temp 98.6 F (37 C) (Oral)   Resp 20   Ht '5\' 6"'$  (1.676 m)   SpO2 100%   BMI 45.52 kg/m   Physical Exam Vitals and nursing note reviewed.  Constitutional:      Appearance: He is well-developed. He is ill-appearing and diaphoretic.  HENT:     Head: Normocephalic and atraumatic.  Eyes:     Extraocular Movements: EOM normal.     Conjunctiva/sclera: Conjunctivae normal.  Cardiovascular:     Rate and Rhythm: Regular rhythm. Tachycardia present.  Pulmonary:     Effort: Pulmonary effort is normal. No respiratory distress.     Breath sounds: No stridor. Decreased breath sounds present.  Abdominal:     General: There is no distension.  Musculoskeletal:     Right lower leg: Edema present.     Left lower leg: Edema present.  Skin:    General: Skin is warm.  Neurological:     Mental Status: He is alert and oriented to person, place, and time.  Psychiatric:        Mood and Affect: Mood and affect normal.      ED Results / Procedures / Treatments   Labs (all labs ordered are listed, but only abnormal results are displayed) Labs Reviewed  CBC WITH DIFFERENTIAL/PLATELET - Abnormal; Notable for the following components:      Result Value   RBC 2.65 (*)    Hemoglobin 7.3 (*)    HCT 25.0 (*)    MCHC 29.2 (*)    RDW 16.4 (*)    All other components within normal limits  COMPREHENSIVE METABOLIC PANEL   BRAIN NATRIURETIC PEPTIDE  URINALYSIS, ROUTINE W REFLEX MICROSCOPIC    EKG EKG Interpretation  Date/Time:  Monday June 22 2020 20:47:25 EST Ventricular Rate:  103 PR Interval:    QRS Duration: 103 QT Interval:  314 QTC Calculation: 411 R Axis:   78 Text Interpretation: Sinus tachycardia  Borderline repolarization abnormality T wave abnormality Abnormal ECG Confirmed by Carmin Muskrat [phone]) on 06/22/2020 9:07:56 PM   Cardiac sinus tach, 110, abnormal Pulse oximetry 97% with nasal cannula, abnormal   Radiology DG Chest Port 1 View  Result Date: 06/22/2020 CLINICAL DATA:  Dyspnea on exertion, lower extremity edema for 2 weeks EXAM: PORTABLE CHEST 1 VIEW COMPARISON:  None. FINDINGS: Single frontal view of the chest demonstrates an enlarged cardiac silhouette. There is central vascular congestion, with small bilateral pleural effusions. No pneumothorax. IMPRESSION: 1. Volume overload, with central vascular congestion and small bilateral pleural effusions. Electronically Signed   By: Randa Ngo M.D.   On: 06/22/2020 21:15    Procedures Procedures   Medications Ordered in ED Medications - No data to display  ED Course  I have reviewed the triage vital signs and the nursing notes.  Pertinent labs & imaging results that were available during my care of the patient were reviewed by me and considered in my medical decision making (see chart for details).     After initial evaluation with consideration of new oxygen requirement, tachycardia, fluid overload status, and x-ray suggesting vascular congestion, the patient received IV Lasix, nitroglycerin paste, and hydralazine.  Update: I discussed the patient's case with our nephrology colleague, internal medicine colleagues Patient will require initiation of dialysis, admission.  Update:, Patient now states that he is more comfortable, receiving 4 L via nasal cannula, saturation 97%.  Heart rate now 100, sinus, borderline He  and I discussed remaining lab results, notable for acute worsening of his renal function, as well as elevated BNP suggesting congestive heart failure, though some may be secondary to his renal dysfunction. Given his hypertension, pulmonary congestion, bilateral effusions, acute kidney injury, need for dialysis, he will be admitted for further monitoring, management.  MDM Rules/Calculators/A&P MDM Number of Diagnoses or Management Options Acute renal failure, unspecified acute renal failure type [facility]): new, needed workup Hypoxia: new, needed workup Pulmonary congestion: new, needed workup   Amount and/or Complexity of Data Reviewed Clinical lab tests: reviewed Tests in the radiology section of CPT: reviewed Tests in the medicine section of CPT: reviewed Decide to obtain previous medical records or to obtain history from someone other than the patient: yes Review and summarize past medical records: yes Discuss the patient with other providers: yes Independent visualization of images, tracings, or specimens: yes  Risk of Complications, Morbidity, and/or Mortality Presenting problems: high Diagnostic procedures: high Management options: high  Critical Care Total time providing critical care: 30-74 minutes (45)  Patient Progress Patient progress: improved  Final Clinical Impression(s) / ED Diagnoses Final diagnoses:  Pulmonary congestion  Hypoxia  Acute renal failure, unspecified acute renal failure type [facility])     Carmin Muskrat, MD 06/22/20 2316

## 2020-06-22 NOTE — ED Notes (Signed)
Chart demographics verified with patient. Patient identifies as a male, prefers his/he/him pronouns, and has male genitalia.Chart updated accordingly.

## 2020-06-22 NOTE — ED Triage Notes (Signed)
After ambulating to ems truck pt's o2 was 78% on room air.

## 2020-06-23 ENCOUNTER — Encounter (HOSPITAL_COMMUNITY)
Admission: RE | Admit: 2020-06-23 | Discharge: 2020-06-23 | Disposition: A | Discharge status: Home / Self Care | Payer: Medicaid Other | Source: Ambulatory Visit | Attending: Nephrology | Admitting: Nephrology

## 2020-06-23 ENCOUNTER — Inpatient Hospital Stay (HOSPITAL_COMMUNITY): Payer: Medicaid Other

## 2020-06-23 DIAGNOSIS — I5021 Acute systolic (congestive) heart failure: Secondary | ICD-10-CM

## 2020-06-23 LAB — COMPREHENSIVE METABOLIC PANEL
ALT: 15 U/L (ref 0–44)
AST: 13 U/L — ABNORMAL LOW (ref 15–41)
Albumin: 3.4 g/dL — ABNORMAL LOW (ref 3.5–5.0)
Alkaline Phosphatase: 49 U/L (ref 38–126)
Anion gap: 15 (ref 5–15)
BUN: 99 mg/dL — ABNORMAL HIGH (ref 6–20)
CO2: 15 mmol/L — ABNORMAL LOW (ref 22–32)
Calcium: 7.6 mg/dL — ABNORMAL LOW (ref 8.9–10.3)
Chloride: 108 mmol/L (ref 98–111)
Creatinine, Ser: 6.62 mg/dL — ABNORMAL HIGH (ref 0.61–1.24)
GFR, Estimated: 10 mL/min — ABNORMAL LOW (ref >60)
Glucose, Bld: 135 mg/dL — ABNORMAL HIGH (ref 70–99)
Potassium: 4.6 mmol/L (ref 3.5–5.1)
Sodium: 138 mmol/L (ref 135–145)
Total Bilirubin: 0.5 mg/dL (ref 0.3–1.2)
Total Protein: 6.6 g/dL (ref 6.5–8.1)

## 2020-06-23 LAB — ECHOCARDIOGRAM COMPLETE
AR max vel: 2.06 cm2
AV Area VTI: 2.79 cm2
AV Area mean vel: 1.8 cm2
AV Mean grad: 2.1 mmHg
AV Peak grad: 3.9 mmHg
Ao pk vel: 0.99 m/s
Area-P 1/2: 2.96 cm2
Height: 66 in
S' Lateral: 4.59 cm
Weight: 4656.12 oz

## 2020-06-23 LAB — RETICULOCYTES
Immature Retic Fract: 17.1 % — ABNORMAL HIGH (ref 2.3–15.9)
RBC.: 2.6 MIL/uL — ABNORMAL LOW (ref 4.22–5.81)
Retic Count, Absolute: 58.8 10*3/uL (ref 19.0–186.0)
Retic Ct Pct: 2.3 % (ref 0.4–3.1)

## 2020-06-23 LAB — CBC
HCT: 24.8 % — ABNORMAL LOW (ref 39.0–52.0)
Hemoglobin: 7.2 g/dL — ABNORMAL LOW (ref 13.0–17.0)
MCH: 26.9 pg (ref 26.0–34.0)
MCHC: 29 g/dL — ABNORMAL LOW (ref 30.0–36.0)
MCV: 92.5 fL (ref 80.0–100.0)
Platelets: 165 10*3/uL (ref 150–400)
RBC: 2.68 MIL/uL — ABNORMAL LOW (ref 4.22–5.81)
RDW: 16.4 % — ABNORMAL HIGH (ref 11.5–15.5)
WBC: 6.4 10*3/uL (ref 4.0–10.5)
nRBC: 0 % (ref 0.0–0.2)

## 2020-06-23 LAB — HEMOGLOBIN A1C
Hgb A1c MFr Bld: 5.9 % — ABNORMAL HIGH (ref 4.8–5.6)
Mean Plasma Glucose: 122.63 mg/dL

## 2020-06-23 LAB — TYPE AND SCREEN
ABO/RH(D): A POS
Antibody Screen: NEGATIVE

## 2020-06-23 LAB — GLUCOSE, CAPILLARY
Glucose-Capillary: 97 mg/dL (ref 70–99)
Glucose-Capillary: 135 mg/dL — ABNORMAL HIGH (ref 70–99)
Glucose-Capillary: 129 mg/dL — ABNORMAL HIGH (ref 70–99)
Glucose-Capillary: 109 mg/dL — ABNORMAL HIGH (ref 70–99)

## 2020-06-23 LAB — TSH: TSH: 3.876 u[IU]/mL (ref 0.350–4.500)

## 2020-06-23 LAB — IRON AND TIBC
Iron: 22 ug/dL — ABNORMAL LOW (ref 45–182)
Saturation Ratios: 9 % — ABNORMAL LOW (ref 17.9–39.5)
TIBC: 248 ug/dL — ABNORMAL LOW (ref 250–450)
UIBC: 226 ug/dL

## 2020-06-23 LAB — HIV ANTIBODY (ROUTINE TESTING W REFLEX): HIV Screen 4th Generation wRfx: NONREACTIVE

## 2020-06-23 LAB — ABO/RH: ABO/RH(D): A POS

## 2020-06-23 LAB — FERRITIN: Ferritin: 439 ng/mL — ABNORMAL HIGH (ref 24–336)

## 2020-06-23 LAB — PROTIME-INR
INR: 1.3 — ABNORMAL HIGH (ref 0.8–1.2)
Prothrombin Time: 15.2 seconds (ref 11.4–15.2)

## 2020-06-23 LAB — T4, FREE: Free T4: 1 ng/dL (ref 0.61–1.12)

## 2020-06-23 LAB — VITAMIN B12: Vitamin B-12: 141 pg/mL — ABNORMAL LOW (ref 180–914)

## 2020-06-23 LAB — MAGNESIUM: Magnesium: 1.7 mg/dL (ref 1.7–2.4)

## 2020-06-23 LAB — APTT: aPTT: 31 seconds (ref 24–36)

## 2020-06-23 LAB — PHOSPHORUS: Phosphorus: 9.2 mg/dL — ABNORMAL HIGH (ref 2.5–4.6)

## 2020-06-23 MED ORDER — ONDANSETRON HCL 4 MG/2ML IJ SOLN: 4.0000 mg | Freq: Four times a day (QID) | INTRAMUSCULAR | Status: DC | PRN

## 2020-06-23 MED ORDER — FUROSEMIDE 10 MG/ML IJ SOLN
80.0000 mg | Freq: Three times a day (TID) | INTRAMUSCULAR | Status: DC
  Administered 2020-06-23 – 2020-06-25 (×5): 80 mg via INTRAVENOUS
  Filled 2020-06-23 (×7): qty 8

## 2020-06-23 MED ORDER — FUROSEMIDE 10 MG/ML IJ SOLN
40.0000 mg | Freq: Once | INTRAMUSCULAR | Status: AC
  Administered 2020-06-23: 40 mg via INTRAVENOUS
  Filled 2020-06-23: qty 4

## 2020-06-23 MED ORDER — CALCIUM ACETATE (PHOS BINDER) 667 MG PO CAPS
1334.0000 mg | ORAL_CAPSULE | Freq: Three times a day (TID) | ORAL | Status: DC
  Administered 2020-06-23 – 2020-06-29 (×16): 1334 mg via ORAL
  Filled 2020-06-23 (×16): qty 2

## 2020-06-23 MED ORDER — CHLORHEXIDINE GLUCONATE CLOTH 2 % EX PADS: 6.0000 | MEDICATED_PAD | Freq: Once | CUTANEOUS | Status: DC

## 2020-06-23 MED ORDER — VITAMIN B-12 1000 MCG PO TABS
1000.0000 ug | ORAL_TABLET | Freq: Every day | ORAL | Status: DC
  Administered 2020-06-24 – 2020-06-29 (×6): 1000 ug via ORAL
  Filled 2020-06-23 (×6): qty 1

## 2020-06-23 MED ORDER — SODIUM CHLORIDE 0.9 % IV SOLN: Freq: Once | INTRAVENOUS | Status: DC

## 2020-06-23 MED ORDER — CEFAZOLIN SODIUM-DEXTROSE 1-4 GM/50ML-% IV SOLN
1.0000 g | INTRAVENOUS | Status: AC
  Administered 2020-06-24: 1 g via INTRAVENOUS
  Filled 2020-06-23: qty 50

## 2020-06-23 MED ORDER — DEXTROSE 5 % IV SOLN: 3.0000 g | INTRAVENOUS | Status: DC

## 2020-06-23 MED ORDER — DARBEPOETIN ALFA 100 MCG/0.5ML IJ SOSY
100.0000 ug | PREFILLED_SYRINGE | INTRAMUSCULAR | Status: DC
  Filled 2020-06-23: qty 0.5

## 2020-06-23 MED ORDER — CHLORHEXIDINE GLUCONATE CLOTH 2 % EX PADS
6.0000 | MEDICATED_PAD | Freq: Once | CUTANEOUS | Status: AC
  Administered 2020-06-23: 6 via TOPICAL

## 2020-06-23 MED ORDER — SODIUM CHLORIDE 0.9 % IV SOLN
125.0000 mg | INTRAVENOUS | Status: DC
  Filled 2020-06-23: qty 10

## 2020-06-23 MED ORDER — ATORVASTATIN CALCIUM 40 MG PO TABS
40.0000 mg | ORAL_TABLET | Freq: Every day | ORAL | Status: DC
  Administered 2020-06-23 – 2020-06-29 (×7): 40 mg via ORAL
  Filled 2020-06-23 (×7): qty 1

## 2020-06-23 MED ORDER — SODIUM CHLORIDE 0.9 % IV SOLN
125.0000 mg | INTRAVENOUS | Status: DC
  Administered 2020-06-24 – 2020-06-29 (×3): 125 mg via INTRAVENOUS
  Filled 2020-06-23 (×3): qty 10

## 2020-06-23 MED ORDER — CEFAZOLIN SODIUM-DEXTROSE 2-4 GM/100ML-% IV SOLN
2.0000 g | INTRAVENOUS | Status: AC
  Administered 2020-06-24: 2 g via INTRAVENOUS

## 2020-06-23 MED ORDER — PERFLUTREN LIPID MICROSPHERE
1.0000 mL | INTRAVENOUS | Status: AC | PRN
  Administered 2020-06-23: 1 mL via INTRAVENOUS
  Filled 2020-06-23: qty 10

## 2020-06-23 MED ORDER — ACETAMINOPHEN 325 MG PO TABS: 650.0000 mg | ORAL_TABLET | Freq: Four times a day (QID) | ORAL | Status: DC | PRN

## 2020-06-23 MED ORDER — CYANOCOBALAMIN 1000 MCG/ML IJ SOLN
1000.0000 ug | Freq: Once | INTRAMUSCULAR | Status: AC
  Administered 2020-06-23: 1000 ug via INTRAMUSCULAR
  Filled 2020-06-23: qty 1

## 2020-06-23 MED ORDER — SODIUM CHLORIDE 0.9 % IV SOLN
510.0000 mg | Freq: Once | INTRAVENOUS | Status: DC
  Filled 2020-06-23: qty 17

## 2020-06-23 MED ORDER — CALCIUM ACETATE (PHOS BINDER) 667 MG PO CAPS: 667.0000 mg | ORAL_CAPSULE | Freq: Three times a day (TID) | ORAL | Status: DC

## 2020-06-23 MED ORDER — DARBEPOETIN ALFA 100 MCG/0.5ML IJ SOSY
100.0000 ug | PREFILLED_SYRINGE | INTRAMUSCULAR | Status: DC
  Administered 2020-06-24: 100 ug via INTRAVENOUS
  Filled 2020-06-23 (×3): qty 0.5

## 2020-06-23 MED ORDER — ASPIRIN EC 81 MG PO TBEC
81.0000 mg | DELAYED_RELEASE_TABLET | Freq: Every day | ORAL | Status: DC
  Administered 2020-06-23 – 2020-06-29 (×7): 81 mg via ORAL
  Filled 2020-06-23 (×7): qty 1

## 2020-06-23 NOTE — Consult Note (Addendum)
Kevin Meyer Admit Date: 06/22/2020 06/23/2020 Kevin Meyer Requesting Physician:  Vanita Panda MD   Reason for Consult:  Eugenie Birks HPI:  61M CKD5 followed by Dr. Theador Hawthorne presented to the ED yesterday with several weeks of progressive dyspnea, especially with exertion and when lying flat.  He noted progressive diffuse swelling including the legs, abdomen, upper extremities.  He was seen by Dr. Theador Hawthorne earlier this month, and his creatinine at that time was 5.6.  Creatinine is 6.6 at the time of presentation, K4.6, bicarbonate 15.  He has recently begun treatment for anemia related to CKD with ESA and iron.  Hemoglobin at time of presentation 7.2.  This morning the patient is dyspneic.  He is unable to lie flat.  He feels tight and swollen.  He has a left radiocephalic AV fistula but because of his swelling it appears to be deep.  There has been recommendations for outpatient vascular evaluation.  This was placed 01/2020 by Dr. Donnetta Hutching.  No significant gastrointestinal uremic symptoms.  He has been placed on furosemide 40 mg IV twice daily with 1.1 L of urine output documented since presentation.  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 Anemia   . Diabetes mellitus without complication ([place])   . Gout   . High cholesterol   . Hypertension   .  Osteoarthritis   . Renal disorder    PSH  Past Surgical History:  Procedure Laterality Date  . AV FISTULA PLACEMENT Left 02/20/2020   Procedure: LEFT ARM ARTERIOVENOUS (AV) FISTULA CREATION;  Surgeon: Rosetta Posner, MD;  Location: [facility];  Service: Vascular;  Laterality: Left;  . NO PAST SURGERIES     FH  Family History  Problem Relation Age of Onset  . Stroke Mother   . Diabetes Other    SH  reports that he has never smoked. He has never used smokeless tobacco. He reports that he does not drink alcohol and does not use drugs. Allergies  Allergies  Allergen Reactions  . Gabapentin Anaphylaxis  . Lyrica [Pregabalin] Anaphylaxis   Home medications Prior to Admission medications   Medication Sig Start Date End Date Taking? Authorizing Provider  allopurinol (ZYLOPRIM) 100 MG tablet Take 100 mg by mouth daily.   Yes [provider]  aspirin EC 81 MG tablet Take 81 mg by mouth daily.   Yes [provider]  atorvastatin (LIPITOR) 40 MG tablet Take 40 mg by mouth daily.   Yes [provider]  calcitRIOL (ROCALTROL) 0.25 MCG capsule Take 0.25 mcg by mouth daily. 06/10/20 06/10/21 Yes [provider]  calcium acetate (PHOSLO) 667 MG capsule Take 667 mg by mouth 3 (three) times daily. 06/10/20  Yes [provider]  chlorpheniramine (CHLOR-TRIMETON) 4 MG tablet Take 4 mg by mouth every 6 (six) hours as needed. 04/02/20  Yes  [provider]  Cholecalciferol (VITAMIN D) 50 MCG (2000 UT) CAPS Take 2,000 Units by mouth daily.   Yes [provider]  diclofenac Sodium (VOLTAREN) 1 % GEL Apply 1 application topically 4 (four) times daily. 02/19/20  Yes [provider]  Dulaglutide (TRULICITY) A999333 0000000 SOPN Inject 0.75 mg into the skin every 7 (seven) days.   Yes [provider]  epoetin alfa-epbx (RETACRIT) 3000 UNIT/ML injection 3,000 Units every 14 (fourteen) days. 06/15/20  Yes Bhutani, Manpreet S, MD  furosemide  (LASIX) 40 MG tablet Take 0.5 tablets (20 mg total) by mouth 2 (two) times daily. Patient take '40mg'$  in the morning and '20mg'$  at 1200pm Patient taking differently: Take 40 mg by mouth 2 (two) times daily. 09/10/17  Yes Barton Dubois, MD  glipiZIDE (GLUCOTROL) 10 MG tablet Take 1 tablet (10 mg total) by mouth 2 (two) times daily before a meal. 08/26/17  Yes Barton Dubois, MD  hydrALAZINE (APRESOLINE) 25 MG tablet Take 50 mg by mouth in the morning and at bedtime.   Yes [provider]  labetalol (NORMODYNE) 100 MG tablet Take 2 tablets (200 mg total) by mouth 2 (two) times daily. Patient taking differently: Take 400 mg by mouth 2 (two) times daily. 09/10/17  Yes Barton Dubois, MD  lovastatin (MEVACOR) 20 MG tablet Take 20 mg by mouth at bedtime.    Yes [provider]  oxyCODONE-acetaminophen (PERCOCET) 5-325 MG tablet Take 1 tablet by mouth every 6 (six) hours as needed for severe pain. Patient not taking: No sig reported 02/20/20   Rosetta Posner, MD    Current Medications Scheduled Meds: . aspirin EC  81 mg Oral Daily  . atorvastatin  40 mg Oral Daily  . calcium acetate  667 mg Oral TID  . furosemide  40 mg Intravenous Q12H  . heparin  5,000 Units Subcutaneous Q8H  . insulin aspart  0-5 Units Subcutaneous QHS  . insulin aspart  0-9 Units Subcutaneous TID WC   Continuous Infusions: PRN Meds:.acetaminophen, ondansetron (ZOFRAN) IV  CBC Recent Labs  Lab 06/22/20 2057 06/23/20 0451  WBC 6.7 6.4  NEUTROABS 5.3  --   HGB 7.3* 7.2*  HCT 25.0* 24.8*  MCV 94.3 92.5  PLT 162 [id number]   Basic Metabolic Panel Recent Labs  Lab 06/22/20 2057 06/23/20 0451  NA 136 138  K 4.6 4.6  CL 104 108  CO2 16* 15*  GLUCOSE 212* 135*  BUN 92* 99*  CREATININE 6.71* 6.62*  CALCIUM 7.5* 7.6*  PHOS  --  9.2*    Physical Exam  Blood pressure 117/87, pulse 98, temperature 97.6 F (36.4 C), temperature source Oral, resp. rate 18, height '5\' 6"'$  (1.676 m), weight 132 kg, SpO2 99 %. GEN:  Obese, appears uncomfortable, not in acute distress ENT: NCAT EYES: EOMI CV: Regular, no rub appreciated PULM: Diminished in the lower portions of each lung field, speaks in full sentences ABD: Soft, nontender, diffuse abdominal wall swelling SKIN: No rashes or lesions EXT: 4+ edema in the legs, 3+ in the arms Vascular: Left radiocephalic AV fistula with bruit and thrill but appears to be deep  Assessment 30M CKD 5 presenting with massive volume overload, anemia, metabolic acidosis.    1. CKD 5, now ESRD: Discussed with patient that his volume status and progressive renal failure are indications to initiate dialysis and he is in agreement.\ 1. It is unclear if his AV fistula can be cannulated, will discuss with nursing staff, if not will need to  pursue HD catheter but his PND could limit the ability to place IJ 2. Plan for HD#1 today: 17g, 250/300, 2L UF, 2h, No heparin, 2K 3. HD #2 tomorrow most likley 4. Will require clip to outpatient dialysis, followed by Dr. Theador Hawthorne prior to presentation 2. Massive volume overload 1. As above 2. In addition, cont lasix, inc to 80 IV TID 3. Continue sodium and fluid restriction 3. Anemia CKD 1. Start Aranesp, 131mg weekly 2. Fe Gluconnate '125mg'$  IV x 4 3. Trend, no indication for transfusion 4. Likely CHF, related ot #1, TTE pending 5. Metabolic acidosis: Will correct with dialysis, do not use sodium bicarbonate 6. CKD-BMD: cont C3 and CaAcetate, trend P 7. DM2 8. HTN  Plan 1. As above 2. Daily weights, Daily Renal Panel, Strict I/Os, Avoid nephrotoxins (NSAIDs, judicious IV Contrast)   RRexene Meyer 06/23/2020, 9:30 AM

## 2020-06-23 NOTE — Progress Notes (Signed)
*  PRELIMINARY RESULTS* Echocardiogram 2D Echocardiogram with definity has been performed.  Leavy Cella 06/23/2020, 10:13 AM

## 2020-06-23 NOTE — Consult Note (Signed)
[facility] Consult  Reason for Consult: Tunneled Dialysis catheter  Referring Physician: Dr. Joelyn Oms Nephrology  Chief Complaint    Shortness of Breath      HPI: Kevin Meyer is a 50 y.o. male with CKD, HTN, HLD, DM, who comes in with progressive dyspnea on exertion and with lying flat. He has a prior left AVF placed in October but this is unable to be accessed due to his swelling by the dialysis RN.  The patient needs to start dialysis and will need a catheter placed.    He reports that his legs and arms and tight and swollen. He has some SOB and is on 4L [place].  He has not really tried to lay flat in a while and sleeps upright.  He is still making urine and his K is within normal limits. He is on lasix IV.   Past Medical History:  Diagnosis Date  . Anemia   . Diabetes mellitus without complication ([place])   . Gout   . High cholesterol   . Hypertension   . Osteoarthritis   . Renal disorder     Past Surgical History:  Procedure Laterality Date  . AV FISTULA PLACEMENT Left 02/20/2020   Procedure: LEFT ARM ARTERIOVENOUS (AV) FISTULA CREATION;  Surgeon: Rosetta Posner, MD;  Location: [facility];  Service: Vascular;  Laterality: Left;  . NO PAST SURGERIES      Family History  Problem Relation Age of Onset  . Stroke Mother   . Diabetes Other     Social History   Tobacco Use  . Smoking status: Never Smoker  . Smokeless tobacco: Never Used  Vaping Use  . Vaping Use: Never used  Substance Use Topics  . Alcohol use: Never    Comment: occasional  . Drug use: Never    Medications:  I have reviewed the patient's current medications. Prior to Admission:  Medications Prior to Admission  Medication Sig Dispense Refill Last Dose  . allopurinol (ZYLOPRIM) 100 MG tablet Take 100 mg by mouth daily.   06/22/2020 at Unknown time  . aspirin EC 81 MG tablet Take 81 mg by mouth daily.   06/22/2020 at Unknown time  . atorvastatin (LIPITOR) 40 MG tablet Take 40 mg by mouth  daily.   06/22/2020 at Unknown time  . calcitRIOL (ROCALTROL) 0.25 MCG capsule Take 0.25 mcg by mouth daily.   06/22/2020 at Unknown time  . calcium acetate (PHOSLO) 667 MG capsule Take 667 mg by mouth 3 (three) times daily.   06/22/2020 at Unknown time  . chlorpheniramine (CHLOR-TRIMETON) 4 MG tablet Take 4 mg by mouth every 6 (six) hours as needed.   Past Week at Unknown time  . Cholecalciferol (VITAMIN D) 50 MCG (2000 UT) CAPS Take 2,000 Units by mouth daily.   06/22/2020 at Unknown time  . diclofenac Sodium (VOLTAREN) 1 % GEL Apply 1 application topically 4 (four) times daily.   Past Month at Unknown time  . Dulaglutide (TRULICITY) [id number] SOPN Inject 0.75 mg into the skin every 7 (seven) days.   06/17/2020  . epoetin alfa-epbx (RETACRIT) 3000 UNIT/ML injection 3,000 Units every 14 (fourteen) days.   Past Week at Unknown time  . furosemide (LASIX) 40 MG tablet Take 0.5 tablets (20 mg total) by mouth 2 (two) times daily. Patient take '40mg'$  in the morning and '20mg'$  at 1200pm (Patient taking differently: Take 40 mg by mouth 2 (two) times daily.) 30 tablet  06/22/2020 at Unknown time  . glipiZIDE (GLUCOTROL)  10 MG tablet Take 1 tablet (10 mg total) by mouth 2 (two) times daily before a meal.   06/22/2020 at Unknown time  . hydrALAZINE (APRESOLINE) 25 MG tablet Take 50 mg by mouth in the morning and at bedtime.   06/22/2020 at Unknown time  . labetalol (NORMODYNE) 100 MG tablet Take 2 tablets (200 mg total) by mouth 2 (two) times daily. (Patient taking differently: Take 400 mg by mouth 2 (two) times daily.) 120 tablet 1 06/22/2020 at 1900  . lovastatin (MEVACOR) 20 MG tablet Take 20 mg by mouth at bedtime.    06/21/2020 at Unknown time  . oxyCODONE-acetaminophen (PERCOCET) 5-325 MG tablet Take 1 tablet by mouth every 6 (six) hours as needed for severe pain. (Patient not taking: No sig reported) 8 tablet 0 Not Taking at Unknown time   Scheduled: . aspirin EC  81 mg Oral Daily  . atorvastatin  40 mg Oral  Daily  . calcium acetate  1,334 mg Oral TID with meals  . Chlorhexidine Gluconate Cloth  6 each Topical Once   And  . Chlorhexidine Gluconate Cloth  6 each Topical Once  . [START ON 06/24/2020] darbepoetin (ARANESP) injection - DIALYSIS  100 mcg Intravenous Q Wed-HD  . furosemide  80 mg Intravenous Q8H  . heparin  5,000 Units Subcutaneous Q8H  . insulin aspart  0-5 Units Subcutaneous QHS  . insulin aspart  0-9 Units Subcutaneous TID WC  . [START ON 06/24/2020] vitamin B-12  1,000 mcg Oral Daily   Continuous: . [START ON 06/24/2020] ferric gluconate (FERRLECIT/NULECIT) IV     [id number], ondansetron (ZOFRAN) IV  Allergies  Allergen Reactions  . Gabapentin Anaphylaxis  . Lyrica [Pregabalin] Anaphylaxis     ROS:  A comprehensive review of systems was negative except for: Respiratory: positive for dyspnea on exertion Cardiovascular: positive for lower extremity edema Genitourinary: positive for CKD, still with urine Musculoskeletal: positive for upper extremity swelling  Blood pressure 139/79, pulse 96, temperature 98.9 F (37.2 C), temperature source Oral, resp. rate 18, height '5\' 6"'$  (1.676 m), weight 132 kg, SpO2 99 %. Physical Exam Vitals reviewed.  Constitutional:      Appearance: He is obese.  HENT:     Head: Atraumatic.  Eyes:     Pupils: Pupils are equal, round, and reactive to light.  Cardiovascular:     Rate and Rhythm: Normal rate.  Pulmonary:     Effort: Pulmonary effort is normal.     Comments: Laid at 20-30 degrees and tolerated this for several minutes Abdominal:     Palpations: Abdomen is soft.     Tenderness: There is no abdominal tenderness.  Musculoskeletal:     Cervical back: Neck supple.     Right lower leg: Edema present.     Left lower leg: Edema present.  Skin:    General: Skin is warm.  Neurological:     General: No focal deficit present.     Mental Status: He is alert and oriented to person, place, and time.  Psychiatric:        Mood and  Affect: Mood normal.        Behavior: Behavior normal.     Results: Results for orders placed or performed during the hospital encounter of 06/22/20 (from the past 48 hour(s))  Comprehensive metabolic panel     Status: Abnormal   Collection Time: 06/22/20  8:57 PM  Result Value Ref Range   Sodium 136 135 - 145 mmol/L   Potassium 4.6 3.5 -  5.1 mmol/L   Chloride 104 98 - 111 mmol/L   CO2 16 (L) 22 - 32 mmol/L   Glucose, Bld 212 (H) 70 - 99 mg/dL    Comment: Glucose reference range applies only to samples taken after fasting for at least 8 hours.   BUN 92 (H) 6 - 20 mg/dL   Creatinine, Ser 6.71 (H) 0.61 - 1.24 mg/dL   Calcium 7.5 (L) 8.9 - 10.3 mg/dL   Total Protein 6.8 6.5 - 8.1 g/dL   Albumin 3.4 (L) 3.5 - 5.0 g/dL   AST 14 (L) 15 - 41 U/L   ALT 16 0 - 44 U/L   Alkaline Phosphatase 53 38 - 126 U/L   Total Bilirubin 0.3 0.3 - 1.2 mg/dL   GFR, Estimated 9 (L) >60 mL/min    Comment: (NOTE) Calculated using the CKD-EPI Creatinine Equation (2021)    Anion gap 16 (H) 5 - 15    Comment: Performed at [facility], [address] [place], [place] [address]  CBC WITH DIFFERENTIAL     Status: Abnormal   Collection Time: 06/22/20  8:57 PM  Result Value Ref Range   WBC 6.7 4.0 - 10.5 K/uL   RBC 2.65 (L) 4.22 - 5.81 MIL/uL   Hemoglobin 7.3 (L) 13.0 - 17.0 g/dL   HCT 25.0 (L) 39.0 - 52.0 %   MCV 94.3 80.0 - 100.0 fL   MCH 27.5 26.0 - 34.0 pg   MCHC 29.2 (L) 30.0 - 36.0 g/dL   RDW 16.4 (H) 11.5 - 15.5 %   Platelets 162 150 - 400 K/uL   nRBC 0.0 0.0 - 0.2 %   Neutrophils Relative % 80 %   Neutro Abs 5.3 1.7 - 7.7 K/uL   Lymphocytes Relative 10 %   Lymphs Abs 0.7 0.7 - 4.0 K/uL   Monocytes Relative 7 %   Monocytes Absolute 0.5 0.1 - 1.0 K/uL   Eosinophils Relative 2 %   Eosinophils Absolute 0.1 0.0 - 0.5 K/uL   Basophils Relative 0 %   Basophils Absolute 0.0 0.0 - 0.1 K/uL   Immature Granulocytes 1 %   Abs Immature Granulocytes 0.04 0.00 - 0.07 K/uL    Comment: Performed at [facility], [address] [place], [place] [address]  Brain natriuretic peptide     Status: Abnormal   Collection Time: 06/22/20  8:57 PM  Result Value Ref Range   B Natriuretic Peptide 2,080.0 (H) 0.0 - 100.0 pg/mL    Comment: Performed at [facility], [address], [place], [place] [address]  Hemoglobin A1c     Status: Abnormal   Collection Time: 06/22/20  8:57 PM  Result Value Ref Range   Hgb A1c MFr Bld 5.9 (H) 4.8 - 5.6 %    Comment: (NOTE) Pre diabetes:          5.7%-6.4%  Diabetes:              >6.4%  Glycemic control for   <7.0% adults with diabetes    Mean Plasma Glucose 122.63 mg/dL    Comment: Performed at [facility], [address], [place], [place] [address]  Urinalysis, Routine w reflex microscopic Urine, Clean Catch     Status: Abnormal   Collection Time: 06/22/20  9:27 PM  Result Value Ref Range   Color, Urine YELLOW YELLOW   APPearance CLEAR CLEAR   Specific Gravity, Urine 1.012 1.005 - 1.030   pH 5.0 5.0 - 8.0   Glucose, UA  50 (A) NEGATIVE mg/dL   Hgb urine dipstick NEGATIVE NEGATIVE   Bilirubin Urine NEGATIVE NEGATIVE   Ketones, ur NEGATIVE NEGATIVE mg/dL   Protein, ur 100 (A) NEGATIVE mg/dL   Nitrite NEGATIVE NEGATIVE   Leukocytes,Ua NEGATIVE NEGATIVE   RBC / HPF 0-5 0 - 5 RBC/hpf   WBC, UA 6-10 0 - 5 WBC/hpf   Bacteria, UA RARE (A) NONE SEEN    Comment: Performed at [facility], [address], [place], [place] [address]  Resp Panel by RT-PCR (Flu A&B, Covid) Nasopharyngeal Swab     Status: None   Collection Time: 06/22/20 10:55 PM   Specimen: Nasopharyngeal Swab; Nasopharyngeal(NP) swabs in vial transport medium  Result Value Ref Range   SARS Coronavirus 2 by RT PCR NEGATIVE NEGATIVE    Comment: (NOTE) SARS-CoV-2 target nucleic acids are NOT DETECTED.  The SARS-CoV-2 RNA is generally detectable in upper respiratory specimens during the acute phase of infection. The lowest concentration of SARS-CoV-2 viral copies this assay can detect  is 138 copies/mL. A negative result does not preclude SARS-Cov-2 infection and should not be used as the sole basis for treatment or other patient management decisions. A negative result may occur with  improper specimen collection/handling, submission of specimen other than nasopharyngeal swab, presence of viral mutation(s) within the areas targeted by this assay, and inadequate number of viral copies(<138 copies/mL). A negative result must be combined with clinical observations, patient history, and epidemiological information. The expected result is Negative.  Fact Sheet for Patients:  EntrepreneurPulse.com.au  Fact Sheet for Healthcare Providers:  IncredibleEmployment.be  This test is no t yet approved or cleared by the [place] FDA and  has been authorized for detection and/or diagnosis of SARS-CoV-2 by FDA under an Emergency Use Authorization (EUA). This EUA will remain  in effect (meaning this test can be used) for the duration of the COVID-19 declaration under Section 564(b)(1) of the Act, 21 U.S.C.section 360bbb-3(b)(1), unless the authorization is terminated  or revoked sooner.       Influenza A by PCR NEGATIVE NEGATIVE   Influenza B by PCR NEGATIVE NEGATIVE    Comment: (NOTE) The Xpert Xpress SARS-CoV-2/FLU/RSV plus assay is intended as an aid in the diagnosis of influenza from Nasopharyngeal swab specimens and should not be used as a sole basis for treatment. Nasal washings and aspirates are unacceptable for Xpert Xpress SARS-CoV-2/FLU/RSV testing.  Fact Sheet for Patients: EntrepreneurPulse.com.au  Fact Sheet for Healthcare Providers: IncredibleEmployment.be  This test is not yet approved or cleared by the [place] FDA and has been authorized for detection and/or diagnosis of SARS-CoV-2 by FDA under an Emergency Use Authorization (EUA). This EUA will remain in effect (meaning this  test can be used) for the duration of the COVID-19 declaration under Section 564(b)(1) of the Act, 21 U.S.C. section 360bbb-3(b)(1), unless the authorization is terminated or revoked.  Performed at [facility], [address], [place], [place] [address]   HIV Antibody (routine testing w rflx)     Status: None   Collection Time: 06/22/20 11:51 PM  Result Value Ref Range   HIV Screen 4th Generation wRfx Non Reactive Non Reactive    Comment: Performed at [facility], [address], [place], [place] [address]  Comprehensive metabolic panel     Status: Abnormal   Collection Time: 06/23/20  4:51 AM  Result Value Ref Range   Sodium 138 135 - 145 mmol/L   Potassium 4.6 3.5 - 5.1 mmol/L   Chloride 108  98 - 111 mmol/L   CO2 15 (L) 22 - 32 mmol/L   Glucose, Bld 135 (H) 70 - 99 mg/dL    Comment: Glucose reference range applies only to samples taken after fasting for at least 8 hours.   BUN 99 (H) 6 - 20 mg/dL   Creatinine, Ser 6.62 (H) 0.61 - 1.24 mg/dL   Calcium 7.6 (L) 8.9 - 10.3 mg/dL   Total Protein 6.6 6.5 - 8.1 g/dL   Albumin 3.4 (L) 3.5 - 5.0 g/dL   AST 13 (L) 15 - 41 U/L   ALT 15 0 - 44 U/L   Alkaline Phosphatase 49 38 - 126 U/L   Total Bilirubin 0.5 0.3 - 1.2 mg/dL   GFR, Estimated 10 (L) >60 mL/min    Comment: (NOTE) Calculated using the CKD-EPI Creatinine Equation (2021)    Anion gap 15 5 - 15    Comment: Performed at [facility], [address] [place], [place] [address]  CBC     Status: Abnormal   Collection Time: 06/23/20  4:51 AM  Result Value Ref Range   WBC 6.4 4.0 - 10.5 K/uL   RBC 2.68 (L) 4.22 - 5.81 MIL/uL   Hemoglobin 7.2 (L) 13.0 - 17.0 g/dL   HCT 24.8 (L) 39.0 - 52.0 %   MCV 92.5 80.0 - 100.0 fL   MCH 26.9 26.0 - 34.0 pg   MCHC 29.0 (L) 30.0 - 36.0 g/dL   RDW 16.4 (H) 11.5 - 15.5 %   Platelets 165 150 - 400 K/uL   nRBC 0.0 0.0 - 0.2 %    Comment: Performed at [facility], [address], [place], [place] [address]  Protime-INR     Status:  Abnormal   Collection Time: 06/23/20  4:51 AM  Result Value Ref Range   Prothrombin Time 15.2 11.4 - 15.2 seconds   INR 1.3 (H) 0.8 - 1.2    Comment: (NOTE) INR goal varies based on device and disease states. Performed at [facility], [address] [place], [place] [address]   APTT     Status: None   Collection Time: 06/23/20  4:51 AM  Result Value Ref Range   aPTT 31 24 - 36 seconds    Comment: Performed at [facility], [address], [place], [place] [address]  Magnesium     Status: None   Collection Time: 06/23/20  4:51 AM  Result Value Ref Range   Magnesium 1.7 1.7 - 2.4 mg/dL    Comment: Performed at [facility], [address] [place], [place] [address]  Phosphorus     Status: Abnormal   Collection Time: 06/23/20  4:51 AM  Result Value Ref Range   Phosphorus 9.2 (H) 2.5 - 4.6 mg/dL    Comment: Performed at [facility], [address], [place], [place] [address]  ABO/Rh     Status: None   Collection Time: 06/23/20  4:51 AM  Result Value Ref Range   ABO/RH(D)      A POS Performed at [facility], [address], [place], [place] [address]   Glucose, capillary     Status: None   Collection Time: 06/23/20  8:30 AM  Result Value Ref Range   Glucose-Capillary 97 70 - 99 mg/dL    Comment: Glucose reference range applies only to samples taken after fasting for at least 8 hours.  Ferritin     Status: Abnormal   Collection Time: 06/23/20  9:26 AM  Result Value Ref Range  Ferritin 439 (H) 24 - 336 ng/mL    Comment: Performed at [facility], [address] [place], [place] [address]  Iron and TIBC     Status: Abnormal   Collection Time: 06/23/20  9:26 AM  Result Value Ref Range   Iron 22 (L) 45 - 182 ug/dL   TIBC 248 (L) 250 - 450 ug/dL   Saturation Ratios 9 (L) 17.9 - 39.5 %   UIBC 226 ug/dL    Comment: Performed at [facility], [address], [place], [place] [address]  Reticulocytes     Status: Abnormal   Collection Time: 06/23/20  9:26 AM  Result Value Ref Range    Retic Ct Pct 2.3 0.4 - 3.1 %   RBC. 2.60 (L) 4.22 - 5.81 MIL/uL   Retic Count, Absolute 58.8 19.0 - 186.0 K/uL   Immature Retic Fract 17.1 (H) 2.3 - 15.9 %    Comment: Performed at [facility], [address] [place], [place] [address]  Vitamin B12     Status: Abnormal   Collection Time: 06/23/20  9:26 AM  Result Value Ref Range   Vitamin B-12 141 (L) 180 - 914 pg/mL    Comment: (NOTE) This assay is not validated for testing neonatal or myeloproliferative syndrome specimens for Vitamin B12 levels. Performed at [facility], [address], [place], [place] [address]   TSH     Status: None   Collection Time: 06/23/20  9:26 AM  Result Value Ref Range   TSH 3.876 0.350 - 4.500 uIU/mL    Comment: Performed by a 3rd Generation assay with a functional sensitivity of <=0.01 uIU/mL. Performed at [facility], [address], [place], [place] [address]   Type and screen [facility]     Status: None   Collection Time: 06/23/20  9:26 AM  Result Value Ref Range   ABO/RH(D) A POS    Antibody Screen NEG    Sample Expiration      06/26/2020,2359 Performed at [facility], [address], [place],  [address]   Glucose, capillary     Status: Abnormal   Collection Time: 06/23/20 11:08 AM  Result Value Ref Range   Glucose-Capillary 135 (H) 70 - 99 mg/dL    Comment: Glucose reference range applies only to samples taken after fasting for at least 8 hours.  Glucose, capillary     Status: Abnormal   Collection Time: 06/23/20  4:21 PM  Result Value Ref Range   Glucose-Capillary 129 (H) 70 - 99 mg/dL    Comment: Glucose reference range applies only to samples taken after fasting for at least 8 hours.    DG Chest Port 1 View  Result Date: 06/22/2020 CLINICAL DATA:  Dyspnea on exertion, lower extremity edema for 2 weeks EXAM: PORTABLE CHEST 1 VIEW COMPARISON:  None. FINDINGS: Single frontal view of the chest demonstrates an enlarged cardiac silhouette. There is central vascular  congestion, with small bilateral pleural effusions. No pneumothorax. IMPRESSION: 1. Volume overload, with central vascular congestion and small bilateral pleural effusions. Electronically Signed   By: Randa Ngo M.D.   On: 06/22/2020 21:15   ECHOCARDIOGRAM COMPLETE  Result Date: 06/23/2020    ECHOCARDIOGRAM REPORT   Patient Name:   Kevin Meyer Date of Exam: 06/23/2020 Medical Rec #:  [id number]   Height:       66.0 in Accession #:    [id number]  Weight:       291.0 lb Date of Birth:  November 27, 1970  BSA:          2.345 m Patient Age:    61 years    BP:           117/87 mmHg Patient Gender: M           HR:           98 bpm. Exam Location:  [facility] Procedure: 2D Echo Indications:    CHF-Acute Systolic [id number]  History:        Patient has no prior history of Echocardiogram examinations.                 CHF; Risk Factors:Non-Smoker, Hypertension, Diabetes and                 Dyslipidemia. Acute Kidney Injury.  Sonographer:    Leavy Cella RDCS (AE) Referring Phys: [id number] OLADAPO ADEFESO IMPRESSIONS  1. Left ventricular ejection fraction, by estimation, is 50%. The left ventricle has low normal function. The left ventricle has no regional wall motion abnormalities. There is mild left ventricular hypertrophy. Left ventricular diastolic parameters are  indeterminate.  2. Right ventricular systolic function is normal. The right ventricular size is normal.  3. Left atrial size was mildly dilated.  4. A small pericardial effusion is present. The pericardial effusion is circumferential.  5. The mitral valve is normal in structure. No evidence of mitral valve regurgitation. No evidence of mitral stenosis.  6. The aortic valve is tricuspid. There is mild calcification of the aortic valve. There is mild thickening of the aortic valve. Aortic valve regurgitation is not visualized. No aortic stenosis is present.  7. The inferior vena cava is normal in size with greater than 50% respiratory variability, suggesting right  atrial pressure of 3 mmHg. FINDINGS  Left Ventricle: Left ventricular ejection fraction, by estimation, is 50%. The left ventricle has low normal function. The left ventricle has no regional wall motion abnormalities. Definity contrast agent was given IV to delineate the left ventricular endocardial borders. The left ventricular internal cavity size was normal in size. There is mild left ventricular hypertrophy. Left ventricular diastolic parameters are indeterminate. Right Ventricle: The right ventricular size is normal. No increase in right ventricular wall thickness. Right ventricular systolic function is normal. Left Atrium: Left atrial size was mildly dilated. Right Atrium: Right atrial size was normal in size. Pericardium: A small pericardial effusion is present. The pericardial effusion is circumferential. Mitral Valve: The mitral valve is normal in structure. No evidence of mitral valve regurgitation. No evidence of mitral valve stenosis. Tricuspid Valve: The tricuspid valve is normal in structure. Tricuspid valve regurgitation is not demonstrated. No evidence of tricuspid stenosis. Aortic Valve: The aortic valve is tricuspid. There is mild calcification of the aortic valve. There is mild thickening of the aortic valve. There is mild aortic valve annular calcification. Aortic valve regurgitation is not visualized. No aortic stenosis  is present. Aortic valve mean gradient measures 2.1 mmHg. Aortic valve peak gradient measures 3.9 mmHg. Aortic valve area, by VTI measures 2.79 cm. Pulmonic Valve: The pulmonic valve was not well visualized. Pulmonic valve regurgitation is not visualized. No evidence of pulmonic stenosis. Aorta: The aortic root is normal in size and structure. Venous: The inferior vena cava is normal in size with greater than 50% respiratory variability, suggesting right atrial pressure of 3 mmHg. IAS/Shunts: No atrial level shunt detected by color flow Doppler.  LEFT VENTRICLE PLAX 2D LVIDd:          5.79 cm  Diastology LVIDs:         4.59 cm  LV e' medial:    6.64 cm/s LV PW:         1.35 cm  LV E/e' medial:  18.2 LV IVS:        1.03 cm  LV e' lateral:   9.90 cm/s LVOT diam:     1.80 cm  LV E/e' lateral: 12.2 LV SV:         52 LV SV Index:   22 LVOT Area:     2.54 cm  RIGHT VENTRICLE TAPSE (M-mode): 2.5 cm LEFT ATRIUM             Index       RIGHT ATRIUM           Index LA diam:        4.70 cm 2.00 cm/m  RA Area:     19.00 cm LA Vol (A2C):   82.5 ml 35.18 ml/m RA Volume:   58.40 ml  24.90 ml/m LA Vol (A4C):   56.0 ml 23.88 ml/m LA Biplane Vol: 68.6 ml 29.25 ml/m  AORTIC VALVE AV Area (Vmax):    2.06 cm AV Area (Vmean):   1.80 cm AV Area (VTI):     2.79 cm AV Vmax:           98.51 cm/s AV Vmean:          69.039 cm/s AV VTI:            0.185 m AV Peak Grad:      3.9 mmHg AV Mean Grad:      2.1 mmHg LVOT Vmax:         79.86 cm/s LVOT Vmean:        48.748 cm/s LVOT VTI:          0.203 m LVOT/AV VTI ratio: 1.10  AORTA Ao Root diam: 3.10 cm MITRAL VALVE MV Area (PHT): 2.96 cm     SHUNTS MV Decel Time: 256 msec     Systemic VTI:  0.20 m MV E velocity: 121.00 cm/s  Systemic Diam: 1.80 cm Carlyle Dolly MD Electronically signed by Carlyle Dolly MD Signature Date/Time: 06/23/2020/12:02:37 PM    Final      Assessment & Plan:  Kevin Meyer is a 50 y.o. male with worsening renal failure who will need to start dialysis via a tunneled catheter as his AVF is not functioning.   -Discussed placement of the tunneled catheter with the patient as he is having issues laying flat and did tolerate this. Discussed that this will be with anesthesia and they will decide the safest means for doing this. Discussed risk of bleeding, infection, injury to vessels, and pneumothorax.  Discussed use of [place] and Xray.   COVID negative  All questions were answered to the satisfaction of the patient and family.   Virl Cagey 06/23/2020, 5:01 PM

## 2020-06-23 NOTE — Progress Notes (Signed)
   NEPHROLOGY NURSING NOTE:  AVF was assessed for cannulation viability.  Unfortunately, thrill is palpable only over the anastomosis and vessel appears to be deep and even further inaccessible due to hypervolemia.  Dr. Constance Haw has been consulted for catheter placement, site TBD.  Rockwell Alexandria, RN

## 2020-06-23 NOTE — Progress Notes (Signed)
[facility] Progress Note  Patient: Kevin Meyer    [id number]  DOA: 06/22/2020     Date of Service: the patient was seen and examined on 06/23/2020  Brief hospital course: Past medical history of HTN, HLD, type II DM, CKD 4, morbid obesity. Presents with complaints of worsening shortness of breath and edema.  Found to have acute diastolic CHF and volume overload secondary to ESRD and HTN. Nephrology is also reconsulted. Currently plan is initiation of HD and monitoring for improvement in volume status.  Assessment and Plan: 1.  Acute on chronic diastolic CHF Significant volume overload. BNP elevated. Patient was started on IV Lasix which we will continue. Strict ins and outs and daily weight. Monitor renal function while the patient is requiring diuresis. Chest x-ray shows congestion.  No significant change in patient's condition. Echocardiogram done shows preserved EF without any wall motion abnormality or significant valvular abnormality.  2.  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 Follows up with nephrology outpatient. Had AV fistula placed outpatient last year. Nephrology consulted, appreciate assistance. Patient will be requiring initiation of hemodialysis given his progression to ESRD.  This was discussed with the patient and currently agreeable. Currently fistula is not functioning as expected and therefore patient will require a temporary HD catheter placement. General surgery consulted.  3.  Anemia of chronic kidney disease with iron and B12 deficiency anemia Hemoglobin significantly low.  Currently stable. Patient denies any acute bleeding. We will provide B12 injection. Iron also ordered with hemodialysis. Patient also receives EPO injections with nephrology.  Management per nephrology.  4.  HLD Continue Lipitor.  5.  Essential hypertension Blood  pressure stable. We will continue to monitor.  6.  Type 2 diabetes mellitus, not on long-term insulin dependence, uncontrolled with hyperglycemia and renal complication Holding home regimen per Currently on insulin sliding scale bolus regimen.  7.  Morbid obesity Placing the patient at high risk for poor outcome. Not a good candidate for subcutaneous injection due to anasarca therefore utilizing IM injection for B12. Body mass index is 46.97 kg/m.    Interventions:        Diet: Renal diet DVT Prophylaxis:   SCD's Start: 06/23/20 1702 heparin injection 5,000 Units Start: 06/23/20 0600 SCDs Start: 06/22/20 2340    Advance goals of care discussion: Full code  Family Communication: family was present at bedside, at the time of interview.  The pt provided permission to discuss medical plan with the family. Opportunity was given to ask question and all questions were answered satisfactorily.   Disposition:  Status is: Inpatient  Remains inpatient appropriate because:Ongoing diagnostic testing needed not appropriate for outpatient work up   Dispo: The patient is from: Home              Anticipated d/c is to: Home              Patient currently is not medically stable to d/c.   Difficult to place patient No        Subjective: No nausea no vomiting or no fever no chills.  Continues to have shortness of breath continues to have volume overload.  No chest pain.  No abdominal pain.  No diarrhea.  No bleeding.  Physical Exam:  General: Appear in mild distress, no Rash; Oral Mucosa Clear, moist. no Abnormal Neck Mass Or lumps, Conjunctiva normal  Cardiovascular: S1 and S2 Present, no Murmur, Respiratory:  good respiratory effort, Bilateral Air entry present and CTA, no Crackles, no wheezes Abdomen: Bowel Sound present, Soft and no tenderness Extremities: Generalized anasarca including pedal edema Neurology: alert and oriented to time, place, and person affect appropriate. no  new focal deficit Gait not checked due to patient safety concerns    Vitals:   06/23/20 0610 06/23/20 0800 06/23/20 1047 06/23/20 1347  BP: (!) 110/54 117/87 (!) 148/76 139/79  Pulse: 92 98 97 96  Resp: '18 18 18 18  '$ Temp: 98.3 F (36.8 C) 97.6 F (36.4 C) 98.2 F (36.8 C) 98.9 F (37.2 C)  TempSrc: Oral Oral Oral Oral  SpO2: 99%   99%  Weight: 132 kg     Height:        Intake/Output Summary (Last 24 hours) at 06/23/2020 1755 Last data filed at 06/23/2020 1300 Gross per 24 hour  Intake 480 ml  Output 1100 ml  Net -620 ml   Filed Weights   06/23/20 0610  Weight: 132 kg    Data Reviewed: I have personally reviewed and interpreted daily labs, tele strips, imaging. I reviewed all nursing notes, pharmacy notes, vitals, pertinent old records I have discussed plan of care as described above with RN and patient/family.  CBC: Recent Labs  Lab 06/22/20 2057 06/23/20 0451  WBC 6.7 6.4  NEUTROABS 5.3  --   HGB 7.3* 7.2*  HCT 25.0* 24.8*  MCV 94.3 92.5  PLT 162 [id number]   Basic Metabolic Panel: Recent Labs  Lab 06/22/20 2057 06/23/20 0451  NA 136 138  K 4.6 4.6  CL 104 108  CO2 16* 15*  GLUCOSE 212* 135*  BUN 92* 99*  CREATININE 6.71* 6.62*  CALCIUM 7.5* 7.6*  MG  --  1.7  PHOS  --  9.2*    Studies: DG Chest Port 1 View  Result Date: 06/22/2020 CLINICAL DATA:  Dyspnea on exertion, lower extremity edema for 2 weeks EXAM: PORTABLE CHEST 1 VIEW COMPARISON:  None. FINDINGS: Single frontal view of the chest demonstrates an enlarged cardiac silhouette. There is central vascular congestion, with small bilateral pleural effusions. No pneumothorax. IMPRESSION: 1. Volume overload, with central vascular congestion and small bilateral pleural effusions. Electronically Signed   By: Randa Ngo M.D.   On: 06/22/2020 21:15   ECHOCARDIOGRAM COMPLETE  Result Date: 06/23/2020    ECHOCARDIOGRAM REPORT   Patient Name:   Kevin Meyer Date of Exam: 06/23/2020 Medical Rec #:  [id number]    Height:       66.0 in Accession #:    [id number]  Weight:       291.0 lb Date of Birth:  Mar 12, 1971    BSA:          2.345 m Patient Age:    50 years    BP:           117/87 mmHg Patient Gender: M           HR:           98 bpm. Exam Location:  [facility] Procedure: 2D Echo Indications:    CHF-Acute Systolic [id number]  History:        Patient has no prior history of Echocardiogram examinations.                 CHF; Risk Factors:Non-Smoker, Hypertension, Diabetes and                 Dyslipidemia. Acute Kidney Injury.  Sonographer:    Leavy Cella RDCS (AE)  Referring Phys: [id number] OLADAPO ADEFESO IMPRESSIONS  1. Left ventricular ejection fraction, by estimation, is 50%. The left ventricle has low normal function. The left ventricle has no regional wall motion abnormalities. There is mild left ventricular hypertrophy. Left ventricular diastolic parameters are  indeterminate.  2. Right ventricular systolic function is normal. The right ventricular size is normal.  3. Left atrial size was mildly dilated.  4. A small pericardial effusion is present. The pericardial effusion is circumferential.  5. The mitral valve is normal in structure. No evidence of mitral valve regurgitation. No evidence of mitral stenosis.  6. The aortic valve is tricuspid. There is mild calcification of the aortic valve. There is mild thickening of the aortic valve. Aortic valve regurgitation is not visualized. No aortic stenosis is present.  7. The inferior vena cava is normal in size with greater than 50% respiratory variability, suggesting right atrial pressure of 3 mmHg. FINDINGS  Left Ventricle: Left ventricular ejection fraction, by estimation, is 50%. The left ventricle has low normal function. The left ventricle has no regional wall motion abnormalities. Definity contrast agent was given IV to delineate the left ventricular endocardial borders. The left ventricular internal cavity size was normal in size. There is mild left ventricular  hypertrophy. Left ventricular diastolic parameters are indeterminate. Right Ventricle: The right ventricular size is normal. No increase in right ventricular wall thickness. Right ventricular systolic function is normal. Left Atrium: Left atrial size was mildly dilated. Right Atrium: Right atrial size was normal in size. Pericardium: A small pericardial effusion is present. The pericardial effusion is circumferential. Mitral Valve: The mitral valve is normal in structure. No evidence of mitral valve regurgitation. No evidence of mitral valve stenosis. Tricuspid Valve: The tricuspid valve is normal in structure. Tricuspid valve regurgitation is not demonstrated. No evidence of tricuspid stenosis. Aortic Valve: The aortic valve is tricuspid. There is mild calcification of the aortic valve. There is mild thickening of the aortic valve. There is mild aortic valve annular calcification. Aortic valve regurgitation is not visualized. No aortic stenosis  is present. Aortic valve mean gradient measures 2.1 mmHg. Aortic valve peak gradient measures 3.9 mmHg. Aortic valve area, by VTI measures 2.79 cm. Pulmonic Valve: The pulmonic valve was not well visualized. Pulmonic valve regurgitation is not visualized. No evidence of pulmonic stenosis. Aorta: The aortic root is normal in size and structure. Venous: The inferior vena cava is normal in size with greater than 50% respiratory variability, suggesting right atrial pressure of 3 mmHg. IAS/Shunts: No atrial level shunt detected by color flow Doppler.  LEFT VENTRICLE PLAX 2D LVIDd:         5.79 cm  Diastology LVIDs:         4.59 cm  LV e' medial:    6.64 cm/s LV PW:         1.35 cm  LV E/e' medial:  18.2 LV IVS:        1.03 cm  LV e' lateral:   9.90 cm/s LVOT diam:     1.80 cm  LV E/e' lateral: 12.2 LV SV:         52 LV SV Index:   22 LVOT Area:     2.54 cm  RIGHT VENTRICLE TAPSE (M-mode): 2.5 cm LEFT ATRIUM             Index       RIGHT ATRIUM           Index LA diam:  4.70 cm 2.00 cm/m  RA Area:     19.00 cm LA Vol (A2C):   82.5 ml 35.18 ml/m RA Volume:   58.40 ml  24.90 ml/m LA Vol (A4C):   56.0 ml 23.88 ml/m LA Biplane Vol: 68.6 ml 29.25 ml/m  AORTIC VALVE AV Area (Vmax):    2.06 cm AV Area (Vmean):   1.80 cm AV Area (VTI):     2.79 cm AV Vmax:           98.51 cm/s AV Vmean:          69.039 cm/s AV VTI:            0.185 m AV Peak Grad:      3.9 mmHg AV Mean Grad:      2.1 mmHg LVOT Vmax:         79.86 cm/s LVOT Vmean:        48.748 cm/s LVOT VTI:          0.203 m LVOT/AV VTI ratio: 1.10  AORTA Ao Root diam: 3.10 cm MITRAL VALVE MV Area (PHT): 2.96 cm     SHUNTS MV Decel Time: 256 msec     Systemic VTI:  0.20 m MV E velocity: 121.00 cm/s  Systemic Diam: 1.80 cm Carlyle Dolly MD Electronically signed by Carlyle Dolly MD Signature Date/Time: 06/23/2020/12:02:37 PM    Final     Scheduled Meds: . aspirin EC  81 mg Oral Daily  . atorvastatin  40 mg Oral Daily  . calcium acetate  1,334 mg Oral TID with meals  . Chlorhexidine Gluconate Cloth  6 each Topical Once   And  . Chlorhexidine Gluconate Cloth  6 each Topical Once  . [START ON 06/24/2020] darbepoetin (ARANESP) injection - DIALYSIS  100 mcg Intravenous Q Wed-HD  . furosemide  80 mg Intravenous Q8H  . heparin  5,000 Units Subcutaneous Q8H  . insulin aspart  0-5 Units Subcutaneous QHS  . insulin aspart  0-9 Units Subcutaneous TID WC  . [START ON 06/24/2020] vitamin B-12  1,000 mcg Oral Daily   Continuous Infusions: . [START ON 06/24/2020]  ceFAZolin (ANCEF) IV     Followed by  . [START ON 06/25/2020]  ceFAZolin (ANCEF) IV    . [START ON 06/24/2020] ferric gluconate (FERRLECIT/NULECIT) IV     PRN Meds: acetaminophen, ondansetron (ZOFRAN) IV  Time spent: 35 minutes  Author: Berle Mull, MD [facility] 06/23/2020 5:55 PM  To reach On-call, see care teams to locate the attending and reach out via [email or web address]. Between 7PM-7AM, please contact night-coverage If you still have difficulty reaching  the attending provider, please page the [facility] (Director on Call) for [facility] on amion for assistance.

## 2020-06-23 NOTE — H&P (View-Only) (Signed)
[facility] Consult  Reason for Consult: Tunneled Dialysis catheter  Referring Physician: Dr. Joelyn Oms Nephrology  Chief Complaint    Shortness of Breath      HPI: Januel Nuon is a 50 y.o. male with CKD, HTN, HLD, DM, who comes in with progressive dyspnea on exertion and with lying flat. He has a prior left AVF placed in October but this is unable to be accessed due to his swelling by the dialysis RN.  The patient needs to start dialysis and will need a catheter placed.    He reports that his legs and arms and tight and swollen. He has some SOB and is on 4L [place].  He has not really tried to lay flat in a while and sleeps upright.  He is still making urine and his K is within normal limits. He is on lasix IV.   Past Medical History:  Diagnosis Date  . Anemia   . Diabetes mellitus without complication ([place])   . Gout   . High cholesterol   . Hypertension   . Osteoarthritis   . Renal disorder     Past Surgical History:  Procedure Laterality Date  . AV FISTULA PLACEMENT Left 02/20/2020   Procedure: LEFT ARM ARTERIOVENOUS (AV) FISTULA CREATION;  Surgeon: Rosetta Posner, MD;  Location: [facility];  Service: Vascular;  Laterality: Left;  . NO PAST SURGERIES      Family History  Problem Relation Age of Onset  . Stroke Mother   . Diabetes Other     Social History   Tobacco Use  . Smoking status: Never Smoker  . Smokeless tobacco: Never Used  Vaping Use  . Vaping Use: Never used  Substance Use Topics  . Alcohol use: Never    Comment: occasional  . Drug use: Never    Medications:  I have reviewed the patient's current medications. Prior to Admission:  Medications Prior to Admission  Medication Sig Dispense Refill Last Dose  . allopurinol (ZYLOPRIM) 100 MG tablet Take 100 mg by mouth daily.   06/22/2020 at Unknown time  . aspirin EC 81 MG tablet Take 81 mg by mouth daily.   06/22/2020 at Unknown time  . atorvastatin (LIPITOR) 40 MG tablet Take 40 mg by mouth  daily.   06/22/2020 at Unknown time  . calcitRIOL (ROCALTROL) 0.25 MCG capsule Take 0.25 mcg by mouth daily.   06/22/2020 at Unknown time  . calcium acetate (PHOSLO) 667 MG capsule Take 667 mg by mouth 3 (three) times daily.   06/22/2020 at Unknown time  . chlorpheniramine (CHLOR-TRIMETON) 4 MG tablet Take 4 mg by mouth every 6 (six) hours as needed.   Past Week at Unknown time  . Cholecalciferol (VITAMIN D) 50 MCG (2000 UT) CAPS Take 2,000 Units by mouth daily.   06/22/2020 at Unknown time  . diclofenac Sodium (VOLTAREN) 1 % GEL Apply 1 application topically 4 (four) times daily.   Past Month at Unknown time  . Dulaglutide (TRULICITY) [id number] SOPN Inject 0.75 mg into the skin every 7 (seven) days.   06/17/2020  . epoetin alfa-epbx (RETACRIT) 3000 UNIT/ML injection 3,000 Units every 14 (fourteen) days.   Past Week at Unknown time  . furosemide (LASIX) 40 MG tablet Take 0.5 tablets (20 mg total) by mouth 2 (two) times daily. Patient take '40mg'$  in the morning and '20mg'$  at 1200pm (Patient taking differently: Take 40 mg by mouth 2 (two) times daily.) 30 tablet  06/22/2020 at Unknown time  . glipiZIDE (GLUCOTROL)  10 MG tablet Take 1 tablet (10 mg total) by mouth 2 (two) times daily before a meal.   06/22/2020 at Unknown time  . hydrALAZINE (APRESOLINE) 25 MG tablet Take 50 mg by mouth in the morning and at bedtime.   06/22/2020 at Unknown time  . labetalol (NORMODYNE) 100 MG tablet Take 2 tablets (200 mg total) by mouth 2 (two) times daily. (Patient taking differently: Take 400 mg by mouth 2 (two) times daily.) 120 tablet 1 06/22/2020 at 1900  . lovastatin (MEVACOR) 20 MG tablet Take 20 mg by mouth at bedtime.    06/21/2020 at Unknown time  . oxyCODONE-acetaminophen (PERCOCET) 5-325 MG tablet Take 1 tablet by mouth every 6 (six) hours as needed for severe pain. (Patient not taking: No sig reported) 8 tablet 0 Not Taking at Unknown time   Scheduled: . aspirin EC  81 mg Oral Daily  . atorvastatin  40 mg Oral  Daily  . calcium acetate  1,334 mg Oral TID with meals  . Chlorhexidine Gluconate Cloth  6 each Topical Once   And  . Chlorhexidine Gluconate Cloth  6 each Topical Once  . [START ON 06/24/2020] darbepoetin (ARANESP) injection - DIALYSIS  100 mcg Intravenous Q Wed-HD  . furosemide  80 mg Intravenous Q8H  . heparin  5,000 Units Subcutaneous Q8H  . insulin aspart  0-5 Units Subcutaneous QHS  . insulin aspart  0-9 Units Subcutaneous TID WC  . [START ON 06/24/2020] vitamin B-12  1,000 mcg Oral Daily   Continuous: . [START ON 06/24/2020] ferric gluconate (FERRLECIT/NULECIT) IV     [id number], ondansetron (ZOFRAN) IV  Allergies  Allergen Reactions  . Gabapentin Anaphylaxis  . Lyrica [Pregabalin] Anaphylaxis     ROS:  A comprehensive review of systems was negative except for: Respiratory: positive for dyspnea on exertion Cardiovascular: positive for lower extremity edema Genitourinary: positive for CKD, still with urine Musculoskeletal: positive for upper extremity swelling  Blood pressure 139/79, pulse 96, temperature 98.9 F (37.2 C), temperature source Oral, resp. rate 18, height '5\' 6"'$  (1.676 m), weight 132 kg, SpO2 99 %. Physical Exam Vitals reviewed.  Constitutional:      Appearance: He is obese.  HENT:     Head: Atraumatic.  Eyes:     Pupils: Pupils are equal, round, and reactive to light.  Cardiovascular:     Rate and Rhythm: Normal rate.  Pulmonary:     Effort: Pulmonary effort is normal.     Comments: Laid at 20-30 degrees and tolerated this for several minutes Abdominal:     Palpations: Abdomen is soft.     Tenderness: There is no abdominal tenderness.  Musculoskeletal:     Cervical back: Neck supple.     Right lower leg: Edema present.     Left lower leg: Edema present.  Skin:    General: Skin is warm.  Neurological:     General: No focal deficit present.     Mental Status: He is alert and oriented to person, place, and time.  Psychiatric:        Mood and  Affect: Mood normal.        Behavior: Behavior normal.     Results: Results for orders placed or performed during the hospital encounter of 06/22/20 (from the past 48 hour(s))  Comprehensive metabolic panel     Status: Abnormal   Collection Time: 06/22/20  8:57 PM  Result Value Ref Range   Sodium 136 135 - 145 mmol/L   Potassium 4.6 3.5 -  5.1 mmol/L   Chloride 104 98 - 111 mmol/L   CO2 16 (L) 22 - 32 mmol/L   Glucose, Bld 212 (H) 70 - 99 mg/dL    Comment: Glucose reference range applies only to samples taken after fasting for at least 8 hours.   BUN 92 (H) 6 - 20 mg/dL   Creatinine, Ser 6.71 (H) 0.61 - 1.24 mg/dL   Calcium 7.5 (L) 8.9 - 10.3 mg/dL   Total Protein 6.8 6.5 - 8.1 g/dL   Albumin 3.4 (L) 3.5 - 5.0 g/dL   AST 14 (L) 15 - 41 U/L   ALT 16 0 - 44 U/L   Alkaline Phosphatase 53 38 - 126 U/L   Total Bilirubin 0.3 0.3 - 1.2 mg/dL   GFR, Estimated 9 (L) >60 mL/min    Comment: (NOTE) Calculated using the CKD-EPI Creatinine Equation (2021)    Anion gap 16 (H) 5 - 15    Comment: Performed at [facility], [address] [place], [place] [address]  CBC WITH DIFFERENTIAL     Status: Abnormal   Collection Time: 06/22/20  8:57 PM  Result Value Ref Range   WBC 6.7 4.0 - 10.5 K/uL   RBC 2.65 (L) 4.22 - 5.81 MIL/uL   Hemoglobin 7.3 (L) 13.0 - 17.0 g/dL   HCT 25.0 (L) 39.0 - 52.0 %   MCV 94.3 80.0 - 100.0 fL   MCH 27.5 26.0 - 34.0 pg   MCHC 29.2 (L) 30.0 - 36.0 g/dL   RDW 16.4 (H) 11.5 - 15.5 %   Platelets 162 150 - 400 K/uL   nRBC 0.0 0.0 - 0.2 %   Neutrophils Relative % 80 %   Neutro Abs 5.3 1.7 - 7.7 K/uL   Lymphocytes Relative 10 %   Lymphs Abs 0.7 0.7 - 4.0 K/uL   Monocytes Relative 7 %   Monocytes Absolute 0.5 0.1 - 1.0 K/uL   Eosinophils Relative 2 %   Eosinophils Absolute 0.1 0.0 - 0.5 K/uL   Basophils Relative 0 %   Basophils Absolute 0.0 0.0 - 0.1 K/uL   Immature Granulocytes 1 %   Abs Immature Granulocytes 0.04 0.00 - 0.07 K/uL    Comment: Performed at [facility], [address], [place], [place] [address]  Brain natriuretic peptide     Status: Abnormal   Collection Time: 06/22/20  8:57 PM  Result Value Ref Range   B Natriuretic Peptide 2,080.0 (H) 0.0 - 100.0 pg/mL    Comment: Performed at [facility], [address] [place], [place] [address]  Hemoglobin A1c     Status: Abnormal   Collection Time: 06/22/20  8:57 PM  Result Value Ref Range   Hgb A1c MFr Bld 5.9 (H) 4.8 - 5.6 %    Comment: (NOTE) Pre diabetes:          5.7%-6.4%  Diabetes:              >6.4%  Glycemic control for   <7.0% adults with diabetes    Mean Plasma Glucose 122.63 mg/dL    Comment: Performed at [facility], [address] [place], [place] [address]  Urinalysis, Routine w reflex microscopic Urine, Clean Catch     Status: Abnormal   Collection Time: 06/22/20  9:27 PM  Result Value Ref Range   Color, Urine YELLOW YELLOW   APPearance CLEAR CLEAR   Specific Gravity, Urine 1.012 1.005 - 1.030   pH 5.0 5.0 - 8.0   Glucose, UA  50 (A) NEGATIVE mg/dL   Hgb urine dipstick NEGATIVE NEGATIVE   Bilirubin Urine NEGATIVE NEGATIVE   Ketones, ur NEGATIVE NEGATIVE mg/dL   Protein, ur 100 (A) NEGATIVE mg/dL   Nitrite NEGATIVE NEGATIVE   Leukocytes,Ua NEGATIVE NEGATIVE   RBC / HPF 0-5 0 - 5 RBC/hpf   WBC, UA 6-10 0 - 5 WBC/hpf   Bacteria, UA RARE (A) NONE SEEN    Comment: Performed at [facility], [address], [place], [place] [address]  Resp Panel by RT-PCR (Flu A&B, Covid) Nasopharyngeal Swab     Status: None   Collection Time: 06/22/20 10:55 PM   Specimen: Nasopharyngeal Swab; Nasopharyngeal(NP) swabs in vial transport medium  Result Value Ref Range   SARS Coronavirus 2 by RT PCR NEGATIVE NEGATIVE    Comment: (NOTE) SARS-CoV-2 target nucleic acids are NOT DETECTED.  The SARS-CoV-2 RNA is generally detectable in upper respiratory specimens during the acute phase of infection. The lowest concentration of SARS-CoV-2 viral copies this assay can detect  is 138 copies/mL. A negative result does not preclude SARS-Cov-2 infection and should not be used as the sole basis for treatment or other patient management decisions. A negative result may occur with  improper specimen collection/handling, submission of specimen other than nasopharyngeal swab, presence of viral mutation(s) within the areas targeted by this assay, and inadequate number of viral copies(<138 copies/mL). A negative result must be combined with clinical observations, patient history, and epidemiological information. The expected result is Negative.  Fact Sheet for Patients:  EntrepreneurPulse.com.au  Fact Sheet for Healthcare Providers:  IncredibleEmployment.be  This test is no t yet approved or cleared by the [place] FDA and  has been authorized for detection and/or diagnosis of SARS-CoV-2 by FDA under an Emergency Use Authorization (EUA). This EUA will remain  in effect (meaning this test can be used) for the duration of the COVID-19 declaration under Section 564(b)(1) of the Act, 21 U.S.C.section 360bbb-3(b)(1), unless the authorization is terminated  or revoked sooner.       Influenza A by PCR NEGATIVE NEGATIVE   Influenza B by PCR NEGATIVE NEGATIVE    Comment: (NOTE) The Xpert Xpress SARS-CoV-2/FLU/RSV plus assay is intended as an aid in the diagnosis of influenza from Nasopharyngeal swab specimens and should not be used as a sole basis for treatment. Nasal washings and aspirates are unacceptable for Xpert Xpress SARS-CoV-2/FLU/RSV testing.  Fact Sheet for Patients: EntrepreneurPulse.com.au  Fact Sheet for Healthcare Providers: IncredibleEmployment.be  This test is not yet approved or cleared by the [place] FDA and has been authorized for detection and/or diagnosis of SARS-CoV-2 by FDA under an Emergency Use Authorization (EUA). This EUA will remain in effect (meaning this  test can be used) for the duration of the COVID-19 declaration under Section 564(b)(1) of the Act, 21 U.S.C. section 360bbb-3(b)(1), unless the authorization is terminated or revoked.  Performed at [facility], [address], [place], [place] [address]   HIV Antibody (routine testing w rflx)     Status: None   Collection Time: 06/22/20 11:51 PM  Result Value Ref Range   HIV Screen 4th Generation wRfx Non Reactive Non Reactive    Comment: Performed at [facility], [address], [place], [place] [address]  Comprehensive metabolic panel     Status: Abnormal   Collection Time: 06/23/20  4:51 AM  Result Value Ref Range   Sodium 138 135 - 145 mmol/L   Potassium 4.6 3.5 - 5.1 mmol/L   Chloride 108  98 - 111 mmol/L   CO2 15 (L) 22 - 32 mmol/L   Glucose, Bld 135 (H) 70 - 99 mg/dL    Comment: Glucose reference range applies only to samples taken after fasting for at least 8 hours.   BUN 99 (H) 6 - 20 mg/dL   Creatinine, Ser 6.62 (H) 0.61 - 1.24 mg/dL   Calcium 7.6 (L) 8.9 - 10.3 mg/dL   Total Protein 6.6 6.5 - 8.1 g/dL   Albumin 3.4 (L) 3.5 - 5.0 g/dL   AST 13 (L) 15 - 41 U/L   ALT 15 0 - 44 U/L   Alkaline Phosphatase 49 38 - 126 U/L   Total Bilirubin 0.5 0.3 - 1.2 mg/dL   GFR, Estimated 10 (L) >60 mL/min    Comment: (NOTE) Calculated using the CKD-EPI Creatinine Equation (2021)    Anion gap 15 5 - 15    Comment: Performed at [facility], [address], [place], [place] [address]  CBC     Status: Abnormal   Collection Time: 06/23/20  4:51 AM  Result Value Ref Range   WBC 6.4 4.0 - 10.5 K/uL   RBC 2.68 (L) 4.22 - 5.81 MIL/uL   Hemoglobin 7.2 (L) 13.0 - 17.0 g/dL   HCT 24.8 (L) 39.0 - 52.0 %   MCV 92.5 80.0 - 100.0 fL   MCH 26.9 26.0 - 34.0 pg   MCHC 29.0 (L) 30.0 - 36.0 g/dL   RDW 16.4 (H) 11.5 - 15.5 %   Platelets 165 150 - 400 K/uL   nRBC 0.0 0.0 - 0.2 %    Comment: Performed at [facility], [address], [place], [place] [address]  Protime-INR     Status:  Abnormal   Collection Time: 06/23/20  4:51 AM  Result Value Ref Range   Prothrombin Time 15.2 11.4 - 15.2 seconds   INR 1.3 (H) 0.8 - 1.2    Comment: (NOTE) INR goal varies based on device and disease states. Performed at [facility], [address], [place], [place] [address]   APTT     Status: None   Collection Time: 06/23/20  4:51 AM  Result Value Ref Range   aPTT 31 24 - 36 seconds    Comment: Performed at [facility], [address] [place], [place] [address]  Magnesium     Status: None   Collection Time: 06/23/20  4:51 AM  Result Value Ref Range   Magnesium 1.7 1.7 - 2.4 mg/dL    Comment: Performed at [facility], [address], [place], [place] [address]  Phosphorus     Status: Abnormal   Collection Time: 06/23/20  4:51 AM  Result Value Ref Range   Phosphorus 9.2 (H) 2.5 - 4.6 mg/dL    Comment: Performed at [facility], [address], [place],  [address]  ABO/Rh     Status: None   Collection Time: 06/23/20  4:51 AM  Result Value Ref Range   ABO/RH(D)      A POS Performed at [facility], [address] [place],  [address]   Glucose, capillary     Status: None   Collection Time: 06/23/20  8:30 AM  Result Value Ref Range   Glucose-Capillary 97 70 - 99 mg/dL    Comment: Glucose reference range applies only to samples taken after fasting for at least 8 hours.  Ferritin     Status: Abnormal   Collection Time: 06/23/20  9:26 AM  Result Value Ref Range  Ferritin 439 (H) 24 - 336 ng/mL    Comment: Performed at [facility], [address] [place], [place] [address]  Iron and TIBC     Status: Abnormal   Collection Time: 06/23/20  9:26 AM  Result Value Ref Range   Iron 22 (L) 45 - 182 ug/dL   TIBC 248 (L) 250 - 450 ug/dL   Saturation Ratios 9 (L) 17.9 - 39.5 %   UIBC 226 ug/dL    Comment: Performed at [facility], [address], [place], [place] [address]  Reticulocytes     Status: Abnormal   Collection Time: 06/23/20  9:26 AM  Result Value Ref Range    Retic Ct Pct 2.3 0.4 - 3.1 %   RBC. 2.60 (L) 4.22 - 5.81 MIL/uL   Retic Count, Absolute 58.8 19.0 - 186.0 K/uL   Immature Retic Fract 17.1 (H) 2.3 - 15.9 %    Comment: Performed at [facility], [address] [place], [place] [address]  Vitamin B12     Status: Abnormal   Collection Time: 06/23/20  9:26 AM  Result Value Ref Range   Vitamin B-12 141 (L) 180 - 914 pg/mL    Comment: (NOTE) This assay is not validated for testing neonatal or myeloproliferative syndrome specimens for Vitamin B12 levels. Performed at [facility], [address] [place], [place] [address]   TSH     Status: None   Collection Time: 06/23/20  9:26 AM  Result Value Ref Range   TSH 3.876 0.350 - 4.500 uIU/mL    Comment: Performed by a 3rd Generation assay with a functional sensitivity of <=0.01 uIU/mL. Performed at [facility], [address], [place], [place] [address]   Type and screen [facility]     Status: None   Collection Time: 06/23/20  9:26 AM  Result Value Ref Range   ABO/RH(D) A POS    Antibody Screen NEG    Sample Expiration      06/26/2020,2359 Performed at [facility], [address], [place], [place] [address]   Glucose, capillary     Status: Abnormal   Collection Time: 06/23/20 11:08 AM  Result Value Ref Range   Glucose-Capillary 135 (H) 70 - 99 mg/dL    Comment: Glucose reference range applies only to samples taken after fasting for at least 8 hours.  Glucose, capillary     Status: Abnormal   Collection Time: 06/23/20  4:21 PM  Result Value Ref Range   Glucose-Capillary 129 (H) 70 - 99 mg/dL    Comment: Glucose reference range applies only to samples taken after fasting for at least 8 hours.    DG Chest Port 1 View  Result Date: 06/22/2020 CLINICAL DATA:  Dyspnea on exertion, lower extremity edema for 2 weeks EXAM: PORTABLE CHEST 1 VIEW COMPARISON:  None. FINDINGS: Single frontal view of the chest demonstrates an enlarged cardiac silhouette. There is central vascular  congestion, with small bilateral pleural effusions. No pneumothorax. IMPRESSION: 1. Volume overload, with central vascular congestion and small bilateral pleural effusions. Electronically Signed   By: Randa Ngo M.D.   On: 06/22/2020 21:15   ECHOCARDIOGRAM COMPLETE  Result Date: 06/23/2020    ECHOCARDIOGRAM REPORT   Patient Name:   Kevin Meyer Date of Exam: 06/23/2020 Medical Rec #:  [id number]   Height:       66.0 in Accession #:    [id number]  Weight:       291.0 lb Date of Birth:  04-07-1971  BSA:          2.345 m Patient Age:    48 years    BP:           117/87 mmHg Patient Gender: M           HR:           98 bpm. Exam Location:  [facility] Procedure: 2D Echo Indications:    CHF-Acute Systolic [id number]  History:        Patient has no prior history of Echocardiogram examinations.                 CHF; Risk Factors:Non-Smoker, Hypertension, Diabetes and                 Dyslipidemia. Acute Kidney Injury.  Sonographer:    Leavy Cella RDCS (AE) Referring Phys: [id number] OLADAPO ADEFESO IMPRESSIONS  1. Left ventricular ejection fraction, by estimation, is 50%. The left ventricle has low normal function. The left ventricle has no regional wall motion abnormalities. There is mild left ventricular hypertrophy. Left ventricular diastolic parameters are  indeterminate.  2. Right ventricular systolic function is normal. The right ventricular size is normal.  3. Left atrial size was mildly dilated.  4. A small pericardial effusion is present. The pericardial effusion is circumferential.  5. The mitral valve is normal in structure. No evidence of mitral valve regurgitation. No evidence of mitral stenosis.  6. The aortic valve is tricuspid. There is mild calcification of the aortic valve. There is mild thickening of the aortic valve. Aortic valve regurgitation is not visualized. No aortic stenosis is present.  7. The inferior vena cava is normal in size with greater than 50% respiratory variability, suggesting right  atrial pressure of 3 mmHg. FINDINGS  Left Ventricle: Left ventricular ejection fraction, by estimation, is 50%. The left ventricle has low normal function. The left ventricle has no regional wall motion abnormalities. Definity contrast agent was given IV to delineate the left ventricular endocardial borders. The left ventricular internal cavity size was normal in size. There is mild left ventricular hypertrophy. Left ventricular diastolic parameters are indeterminate. Right Ventricle: The right ventricular size is normal. No increase in right ventricular wall thickness. Right ventricular systolic function is normal. Left Atrium: Left atrial size was mildly dilated. Right Atrium: Right atrial size was normal in size. Pericardium: A small pericardial effusion is present. The pericardial effusion is circumferential. Mitral Valve: The mitral valve is normal in structure. No evidence of mitral valve regurgitation. No evidence of mitral valve stenosis. Tricuspid Valve: The tricuspid valve is normal in structure. Tricuspid valve regurgitation is not demonstrated. No evidence of tricuspid stenosis. Aortic Valve: The aortic valve is tricuspid. There is mild calcification of the aortic valve. There is mild thickening of the aortic valve. There is mild aortic valve annular calcification. Aortic valve regurgitation is not visualized. No aortic stenosis  is present. Aortic valve mean gradient measures 2.1 mmHg. Aortic valve peak gradient measures 3.9 mmHg. Aortic valve area, by VTI measures 2.79 cm. Pulmonic Valve: The pulmonic valve was not well visualized. Pulmonic valve regurgitation is not visualized. No evidence of pulmonic stenosis. Aorta: The aortic root is normal in size and structure. Venous: The inferior vena cava is normal in size with greater than 50% respiratory variability, suggesting right atrial pressure of 3 mmHg. IAS/Shunts: No atrial level shunt detected by color flow Doppler.  LEFT VENTRICLE PLAX 2D LVIDd:          5.79 cm  Diastology LVIDs:         4.59 cm  LV e' medial:    6.64 cm/s LV PW:         1.35 cm  LV E/e' medial:  18.2 LV IVS:        1.03 cm  LV e' lateral:   9.90 cm/s LVOT diam:     1.80 cm  LV E/e' lateral: 12.2 LV SV:         52 LV SV Index:   22 LVOT Area:     2.54 cm  RIGHT VENTRICLE TAPSE (M-mode): 2.5 cm LEFT ATRIUM             Index       RIGHT ATRIUM           Index LA diam:        4.70 cm 2.00 cm/m  RA Area:     19.00 cm LA Vol (A2C):   82.5 ml 35.18 ml/m RA Volume:   58.40 ml  24.90 ml/m LA Vol (A4C):   56.0 ml 23.88 ml/m LA Biplane Vol: 68.6 ml 29.25 ml/m  AORTIC VALVE AV Area (Vmax):    2.06 cm AV Area (Vmean):   1.80 cm AV Area (VTI):     2.79 cm AV Vmax:           98.51 cm/s AV Vmean:          69.039 cm/s AV VTI:            0.185 m AV Peak Grad:      3.9 mmHg AV Mean Grad:      2.1 mmHg LVOT Vmax:         79.86 cm/s LVOT Vmean:        48.748 cm/s LVOT VTI:          0.203 m LVOT/AV VTI ratio: 1.10  AORTA Ao Root diam: 3.10 cm MITRAL VALVE MV Area (PHT): 2.96 cm     SHUNTS MV Decel Time: 256 msec     Systemic VTI:  0.20 m MV E velocity: 121.00 cm/s  Systemic Diam: 1.80 cm Carlyle Dolly MD Electronically signed by Carlyle Dolly MD Signature Date/Time: 06/23/2020/12:02:37 PM    Final      Assessment & Plan:  Xzander Crumrine is a 50 y.o. male with worsening renal failure who will need to start dialysis via a tunneled catheter as his AVF is not functioning.   -Discussed placement of the tunneled catheter with the patient as he is having issues laying flat and did tolerate this. Discussed that this will be with anesthesia and they will decide the safest means for doing this. Discussed risk of bleeding, infection, injury to vessels, and pneumothorax.  Discussed use of [place] and Xray.   COVID negative  All questions were answered to the satisfaction of the patient and family.   Virl Cagey 06/23/2020, 5:01 PM

## 2020-06-24 ENCOUNTER — Inpatient Hospital Stay (HOSPITAL_COMMUNITY): Payer: Medicaid Other

## 2020-06-24 ENCOUNTER — Encounter (HOSPITAL_COMMUNITY): Payer: Self-pay | Admitting: Internal Medicine

## 2020-06-24 ENCOUNTER — Inpatient Hospital Stay (HOSPITAL_COMMUNITY): Payer: Medicaid Other | Admitting: Anesthesiology

## 2020-06-24 ENCOUNTER — Encounter (HOSPITAL_COMMUNITY)
Admission: EM | Disposition: A | Discharge status: Home / Self Care | Payer: Self-pay | Source: Home / Self Care | Attending: Internal Medicine

## 2020-06-24 DIAGNOSIS — Z992 Dependence on renal dialysis: Secondary | ICD-10-CM

## 2020-06-24 HISTORY — PX: INSERTION OF DIALYSIS CATHETER: SHX1324

## 2020-06-24 LAB — PARATHYROID HORMONE, INTACT (NO CA): PTH: 188 pg/mL — ABNORMAL HIGH (ref 15–65)

## 2020-06-24 LAB — GLUCOSE, CAPILLARY
Glucose-Capillary: 70 mg/dL (ref 70–99)
Glucose-Capillary: 72 mg/dL (ref 70–99)
Glucose-Capillary: 71 mg/dL (ref 70–99)
Glucose-Capillary: 70 mg/dL (ref 70–99)
Glucose-Capillary: 94 mg/dL (ref 70–99)
Glucose-Capillary: 96 mg/dL (ref 70–99)
Glucose-Capillary: 89 mg/dL (ref 70–99)
Glucose-Capillary: 177 mg/dL — ABNORMAL HIGH (ref 70–99)

## 2020-06-24 LAB — HEPATITIS B CORE ANTIBODY, TOTAL: Hep B Core Total Ab: NONREACTIVE

## 2020-06-24 LAB — HEPATITIS B SURFACE ANTIGEN: Hepatitis B Surface Ag: NONREACTIVE

## 2020-06-24 SURGERY — INSERTION OF DIALYSIS CATHETER
Anesthesia: General | Site: Neck | Laterality: Right

## 2020-06-24 MED ORDER — MIDAZOLAM HCL 2 MG/2ML IJ SOLN
INTRAMUSCULAR | Status: AC
  Filled 2020-06-24: qty 2

## 2020-06-24 MED ORDER — LIDOCAINE HCL (PF) 1 % IJ SOLN
INTRAMUSCULAR | Status: AC
  Filled 2020-06-24: qty 30

## 2020-06-24 MED ORDER — ONDANSETRON HCL 4 MG/2ML IJ SOLN
INTRAMUSCULAR | Status: DC | PRN
  Administered 2020-06-24: 4 mg via INTRAVENOUS

## 2020-06-24 MED ORDER — SODIUM CHLORIDE 0.9 % IV SOLN: INTRAVENOUS | Status: DC

## 2020-06-24 MED ORDER — ROCURONIUM BROMIDE 100 MG/10ML IV SOLN
INTRAVENOUS | Status: DC | PRN
  Administered 2020-06-24: 25 mg via INTRAVENOUS

## 2020-06-24 MED ORDER — SODIUM CHLORIDE (PF) 0.9 % IJ SOLN
INTRAMUSCULAR | Status: DC | PRN
  Administered 2020-06-24: 10 mL via INTRAVENOUS

## 2020-06-24 MED ORDER — LACTATED RINGERS IV SOLN: INTRAVENOUS | Status: DC

## 2020-06-24 MED ORDER — HEPARIN 1000 UNIT/ML FOR PERITONEAL DIALYSIS
INTRAMUSCULAR | Status: DC | PRN
  Administered 2020-06-24: 4 mL via INTRAPERITONEAL

## 2020-06-24 MED ORDER — FENTANYL CITRATE (PF) 100 MCG/2ML IJ SOLN
INTRAMUSCULAR | Status: DC | PRN
  Administered 2020-06-24: 50 ug via INTRAVENOUS

## 2020-06-24 MED ORDER — PHENYLEPHRINE 40 MCG/ML (10ML) SYRINGE FOR IV PUSH (FOR BLOOD PRESSURE SUPPORT)
PREFILLED_SYRINGE | INTRAVENOUS | Status: DC | PRN
  Administered 2020-06-24: 40 ug via INTRAVENOUS

## 2020-06-24 MED ORDER — OXYCODONE HCL 5 MG PO TABS: 5.0000 mg | ORAL_TABLET | ORAL | Status: DC | PRN

## 2020-06-24 MED ORDER — METOPROLOL TARTRATE 5 MG/5ML IV SOLN
INTRAVENOUS | Status: AC
  Filled 2020-06-24: qty 5

## 2020-06-24 MED ORDER — CHLORHEXIDINE GLUCONATE 0.12 % MT SOLN
15.0000 mL | Freq: Once | OROMUCOSAL | Status: AC
  Administered 2020-06-24: 15 mL via OROMUCOSAL
  Filled 2020-06-24: qty 15

## 2020-06-24 MED ORDER — ORAL CARE MOUTH RINSE: 15.0000 mL | Freq: Once | OROMUCOSAL | Status: AC

## 2020-06-24 MED ORDER — HEPARIN SODIUM (PORCINE) 1000 UNIT/ML IJ SOLN
3900.0000 [IU] | Freq: Once | INTRAMUSCULAR | Status: AC
  Administered 2020-06-24: 3900 [IU]
  Filled 2020-06-24: qty 4

## 2020-06-24 MED ORDER — HYDROMORPHONE HCL 1 MG/ML IJ SOLN
0.5000 mg | INTRAMUSCULAR | Status: DC | PRN
  Filled 2020-06-24: qty 0.5

## 2020-06-24 MED ORDER — FENTANYL CITRATE (PF) 100 MCG/2ML IJ SOLN
INTRAMUSCULAR | Status: AC
  Filled 2020-06-24: qty 2

## 2020-06-24 MED ORDER — DEXTROSE 50 % IV SOLN
25.0000 mL | Freq: Once | INTRAVENOUS | Status: AC
  Administered 2020-06-24: 25 mL via INTRAVENOUS

## 2020-06-24 MED ORDER — CEFAZOLIN SODIUM-DEXTROSE 2-4 GM/100ML-% IV SOLN
INTRAVENOUS | Status: AC
  Filled 2020-06-24: qty 100

## 2020-06-24 MED ORDER — CEFAZOLIN SODIUM-DEXTROSE 1-4 GM/50ML-% IV SOLN
INTRAVENOUS | Status: AC
  Filled 2020-06-24: qty 50

## 2020-06-24 MED ORDER — DEXAMETHASONE SODIUM PHOSPHATE 10 MG/ML IJ SOLN
INTRAMUSCULAR | Status: DC | PRN
  Administered 2020-06-24: 10 mg via INTRAVENOUS

## 2020-06-24 MED ORDER — PROPOFOL 10 MG/ML IV BOLUS
INTRAVENOUS | Status: DC | PRN
  Administered 2020-06-24: 100 mg via INTRAVENOUS

## 2020-06-24 MED ORDER — DEXTROSE 50 % IV SOLN
INTRAVENOUS | Status: AC
  Filled 2020-06-24: qty 50

## 2020-06-24 MED ORDER — SUCCINYLCHOLINE CHLORIDE 200 MG/10ML IV SOSY
PREFILLED_SYRINGE | INTRAVENOUS | Status: DC | PRN
  Administered 2020-06-24: 120 mg via INTRAVENOUS

## 2020-06-24 MED ORDER — LIDOCAINE HCL (PF) 1 % IJ SOLN
INTRAMUSCULAR | Status: DC | PRN
  Administered 2020-06-24: 10 mL

## 2020-06-24 MED ORDER — HYDROMORPHONE HCL 1 MG/ML IJ SOLN: 0.2500 mg | INTRAMUSCULAR | Status: DC | PRN

## 2020-06-24 MED ORDER — HEPARIN SODIUM (PORCINE) 1000 UNIT/ML IJ SOLN
INTRAMUSCULAR | Status: AC
  Filled 2020-06-24: qty 4

## 2020-06-24 MED ORDER — LIDOCAINE 2% (20 MG/ML) 5 ML SYRINGE
INTRAMUSCULAR | Status: DC | PRN
  Administered 2020-06-24: 100 mg via INTRAVENOUS

## 2020-06-24 MED ORDER — SUGAMMADEX SODIUM 200 MG/2ML IV SOLN
INTRAVENOUS | Status: DC | PRN
  Administered 2020-06-24: 200 mg via INTRAVENOUS

## 2020-06-24 MED ORDER — CHLORHEXIDINE GLUCONATE CLOTH 2 % EX PADS
6.0000 | MEDICATED_PAD | Freq: Every day | CUTANEOUS | Status: DC
  Administered 2020-06-26 – 2020-06-29 (×4): 6 via TOPICAL

## 2020-06-24 SURGICAL SUPPLY — 43 items
APPLICATOR CHLORAPREP 10.5 ORG (MISCELLANEOUS) ×2 IMPLANT
BAG DECANTER FOR FLEXI CONT (MISCELLANEOUS) ×2 IMPLANT
BIOPATCH RED 1 DISK 7.0 (GAUZE/BANDAGES/DRESSINGS) ×2 IMPLANT
CATH PALINDROME-P 23CM W/VT (CATHETERS) ×1 IMPLANT
COVER LIGHT HANDLE STERIS (MISCELLANEOUS) ×4 IMPLANT
COVER PROBE U/S 5X48 (MISCELLANEOUS) ×2 IMPLANT
COVER WAND RF STERILE (DRAPES) ×2 IMPLANT
DECANTER SPIKE VIAL GLASS SM (MISCELLANEOUS) ×4 IMPLANT
DERMABOND ADVANCED (GAUZE/BANDAGES/DRESSINGS) ×1
DERMABOND ADVANCED .7 DNX12 (GAUZE/BANDAGES/DRESSINGS) ×1 IMPLANT
DRAPE C-ARM FOLDED MOBILE STRL (DRAPES) ×2 IMPLANT
DRAPE CHEST BREAST 15X10 FENES (DRAPES) ×2 IMPLANT
DRSG SORBAVIEW 3.5X5-5/16 MED (GAUZE/BANDAGES/DRESSINGS) ×2 IMPLANT
ELECT REM PT RETURN 9FT ADLT (ELECTROSURGICAL) ×2
ELECTRODE REM PT RTRN 9FT ADLT (ELECTROSURGICAL) ×1 IMPLANT
GAUZE 4X4 16PLY RFD (DISPOSABLE) ×2 IMPLANT
GEL ULTRASOUND 20GR AQUASONIC (MISCELLANEOUS) ×2 IMPLANT
GLOVE SURG ENC MOIS LTX SZ6.5 (GLOVE) ×2 IMPLANT
GLOVE SURG UNDER POLY LF SZ6.5 (GLOVE) ×2 IMPLANT
GLOVE SURG UNDER POLY LF SZ7 (GLOVE) ×4 IMPLANT
GOWN STRL REUS W/TWL LRG LVL3 (GOWN DISPOSABLE) ×4 IMPLANT
IV CONNECTOR ONE LINK NDLESS (IV SETS) IMPLANT
IV NS 500ML (IV SOLUTION) ×1
IV NS 500ML BAXH (IV SOLUTION) ×1 IMPLANT
KIT BLADEGUARD II DBL (SET/KITS/TRAYS/PACK) ×2 IMPLANT
KIT PALINDROME-P 55CM (CATHETERS) IMPLANT
KIT TURNOVER KIT A (KITS) ×2 IMPLANT
MARKER SKIN DUAL TIP RULER LAB (MISCELLANEOUS) ×2 IMPLANT
NDL HYPO 18GX1.5 BLUNT FILL (NEEDLE) ×1 IMPLANT
NDL HYPO 25X1 1.5 SAFETY (NEEDLE) ×1 IMPLANT
NEEDLE HYPO 18GX1.5 BLUNT FILL (NEEDLE) ×2 IMPLANT
NEEDLE HYPO 25X1 1.5 SAFETY (NEEDLE) ×2 IMPLANT
PACK BASIC III (CUSTOM PROCEDURE TRAY) ×1
PACK SRG BSC III STRL LF ECLPS (CUSTOM PROCEDURE TRAY) ×1 IMPLANT
PAD ARMBOARD 7.5X6 YLW CONV (MISCELLANEOUS) ×2 IMPLANT
PENCIL SMOKE EVACUATOR COATED (MISCELLANEOUS) ×2 IMPLANT
SET BASIN LINEN APH (SET/KITS/TRAYS/PACK) ×2 IMPLANT
SUT MNCRL AB 4-0 PS2 18 (SUTURE) ×2 IMPLANT
SUT SILK 2 0 FSL 18 (SUTURE) ×2 IMPLANT
SUT VIC AB 3-0 SH 27 (SUTURE) ×1
SUT VIC AB 3-0 SH 27X BRD (SUTURE) ×1 IMPLANT
SYR 10ML LL (SYRINGE) ×4 IMPLANT
SYR CONTROL 10ML LL (SYRINGE) ×2 IMPLANT

## 2020-06-24 NOTE — Transfer of Care (Signed)
Immediate Anesthesia Transfer of Care Note  Patient: Kevin Meyer  Procedure(s) Performed: INSERTION OF DIALYSIS CATHETER (Neck)  Patient Location: PACU  Anesthesia Type:General  Level of Consciousness: awake  Airway & Oxygen Therapy: Patient Spontanous Breathing and Patient connected to face mask oxygen  Post-op Assessment: Report given to RN and Post -op Vital signs reviewed and stable  Post vital signs: Reviewed and stable  Last Vitals:  Vitals Value Taken Time  BP 117/59 06/24/20 1345  Temp 36.9 C 06/24/20 1345  Pulse 90 06/24/20 1357  Resp 28 06/24/20 1355  SpO2 100 % 06/24/20 1357  Vitals shown include unvalidated device data.  Last Pain:  Vitals:   06/24/20 0557  TempSrc: Oral  PainSc:          Complications: No complications documented.

## 2020-06-24 NOTE — Progress Notes (Signed)
Admit: 06/22/2020 LOS: 2  73M CKD 5 presenting with massive volume overload, anemia, metabolic acidosis.  New ESRD  Subjective:  . AVF not suitable for cannulation will need outpt vascular eval . TDC to be placed today . 1.7L UOP with lasix  . No c/o this AM . TTE LVEF 50%; small pericardial effusion  03/01 0701 - 03/02 0700 In: 840 [P.O.:840] Out: 1700 [Urine:1700]  Filed Weights   06/23/20 0610 06/24/20 0500  Weight: 132 kg 130.4 kg    Scheduled Meds: . aspirin EC  81 mg Oral Daily  . atorvastatin  40 mg Oral Daily  . calcium acetate  1,334 mg Oral TID with meals  . chlorhexidine  15 mL Mouth/Throat Once   Or  . mouth rinse  15 mL Mouth Rinse Once  . Chlorhexidine Gluconate Cloth  6 each Topical Once  . darbepoetin (ARANESP) injection - DIALYSIS  100 mcg Intravenous Q Wed-HD  . furosemide  80 mg Intravenous Q8H  . heparin  5,000 Units Subcutaneous Q8H  . insulin aspart  0-5 Units Subcutaneous QHS  . insulin aspart  0-9 Units Subcutaneous TID WC  . vitamin B-12  1,000 mcg Oral Daily   Continuous Infusions: .  ceFAZolin (ANCEF) IV     Followed by  . [START ON 06/25/2020]  ceFAZolin (ANCEF) IV    . ferric gluconate (FERRLECIT/NULECIT) IV     PRN Meds:.acetaminophen, ondansetron (ZOFRAN) IV  Current Labs: reviewed    Physical Exam:  Blood pressure 117/81, pulse 95, temperature (!) 97.5 F (36.4 C), temperature source Oral, resp. rate 20, height '5\' 6"'$  (1.676 m), weight 130.4 kg, SpO2 100 %. GEN: Obese, appears uncomfortable, not in acute distress ENT: NCAT EYES: EOMI CV: Regular, no rub appreciated PULM: Diminished in the lower portions of each lung field, speaks in full sentences ABD: Soft, nontender, diffuse abdominal wall swelling SKIN: No rashes or lesions EXT: 4+ edema in the legs, 3+ in the arms Vascular: Left radiocephalic AV fistula with bruit and thrill but appears to be deep  A 1. CKD 5, now ESRD: Discussed with patient that his volume status and  progressive renal failure are indications to initiate dialysis and he is in agreement. 1.  AVF not superficial or ready for cannulation, will req further eval with [facility]VS 2. For [facility] with Dr Constance Haw today 3. Plan for HD#1 today: 17g, 250/300, 2L UF, 2h, No heparin, 2K 4. HD #2 tomorrow most likely for ongoing UF 5. Clip to outpatient dialysis, followed by Dr. Theador Hawthorne prior to presentation, plan is for [facility] 2. Massive volume overload 1. As above 2. In addition, cont lasix, inc to 80 IV TID 3. Continue sodium and fluid restriction 3. Anemia CKD 1. Start Aranesp, 170mg weekly 2. Fe Gluconnate '125mg'$  IV x 4 3. Trend, no indication for transfusion 4. Likely CHF, related ot #1, TTE LVEF 50%. Mostly this is #1 5. Metabolic acidosis: Will correct with dialysis, do not use sodium bicarbonate 6. CKD-BMD: cont C3 and CaAcetate, trend P 7. DM2 8. HTN  Plan 1. As above 2. Daily weights, Daily Renal Panel, Strict I/Os, Avoid nephrotoxins (NSAIDs, judicious IV Contrast)   RPearson GrippeMD 06/24/2020, 8:55 AM  Recent Labs  Lab 06/22/20 2057 06/23/20 0451  NA 136 138  K 4.6 4.6  CL 104 108  CO2 16* 15*  GLUCOSE 212* 135*  BUN 92* 99*  CREATININE 6.71* 6.62*  CALCIUM 7.5* 7.6*  PHOS  --  9.2*   Recent Labs  Lab 06/22/20 2057 06/23/20 0451  WBC 6.7 6.4  NEUTROABS 5.3  --   HGB 7.3* 7.2*  HCT 25.0* 24.8*  MCV 94.3 92.5  PLT 162 165

## 2020-06-24 NOTE — Anesthesia Postprocedure Evaluation (Signed)
Anesthesia Post Note  Patient: Kevin Meyer  Procedure(s) Performed: INSERTION OF DIALYSIS CATHETER (Neck)  Patient location during evaluation: PACU Anesthesia Type: General Level of consciousness: awake Pain management: pain level controlled Vital Signs Assessment: post-procedure vital signs reviewed and stable Respiratory status: spontaneous breathing, nonlabored ventilation and respiratory function stable Cardiovascular status: stable Postop Assessment: no apparent nausea or vomiting Anesthetic complications: no   No complications documented.   Last Vitals:  Vitals:   06/24/20 1343 06/24/20 1345  BP: 114/65 (!) 117/59  Pulse: 87 73  Resp: (!) 28 (!) 27  Temp:  36.9 C  SpO2: 99% 100%    Last Pain:  Vitals:   06/24/20 0557  TempSrc: Oral  PainSc:                  [name]

## 2020-06-24 NOTE — Anesthesia Postprocedure Evaluation (Signed)
Anesthesia Post Note  Patient: Kevin Meyer  Procedure(s) Performed: INSERTION OF DIALYSIS CATHETER (Right Neck)  Patient location during evaluation: PACU Anesthesia Type: General Level of consciousness: awake and alert and oriented Pain management: pain level controlled Vital Signs Assessment: post-procedure vital signs reviewed and stable Respiratory status: spontaneous breathing, nonlabored ventilation, respiratory function stable and patient connected to nasal cannula oxygen Cardiovascular status: blood pressure returned to baseline and stable Postop Assessment: no apparent nausea or vomiting Anesthetic complications: no   No complications documented.   Last Vitals:  Vitals:   06/24/20 1445 06/24/20 1505  BP: 139/67 (!) 143/78  Pulse: 93 (!) 101  Resp: (!) 22 18  Temp: 36.9 C 36.7 C  SpO2: 98% 95%    Last Pain:  Vitals:   06/24/20 1505  TempSrc: Oral  PainSc: 0-No pain                 Rajamani C Battula

## 2020-06-24 NOTE — Interval H&P Note (Signed)
History and Physical Interval Note:  06/24/2020 11:50 AM  Kevin Meyer  has presented today for surgery, with the diagnosis of ESRD.  The various methods of treatment have been discussed with the patient and family. After consideration of risks, benefits and other options for treatment, the patient has consented to  Procedure(s): INSERTION OF DIALYSIS CATHETER (Right) as a surgical intervention.  The patient's history has been reviewed, patient examined, no change in status, stable for surgery.  I have reviewed the patient's chart and labs.  Questions were answered to the patient's satisfaction.    No additional questions.  Virl Cagey

## 2020-06-24 NOTE — Anesthesia Preprocedure Evaluation (Addendum)
Anesthesia Evaluation  Patient identified by MRN, date of birth, ID band Patient awake    Reviewed: Allergy & Precautions, NPO status , Patient's Chart, lab work & pertinent test results  History of Anesthesia Complications Negative for: history of anesthetic complications  Airway Mallampati: III  TM Distance: >3 FB Neck ROM: Full    Dental  (+) Dental Advisory Given, Missing   Pulmonary sleep apnea ,    Pulmonary exam normal breath sounds clear to auscultation       Cardiovascular Exercise Tolerance: Poor hypertension, Pt. on medications and Pt. on home beta blockers +CHF  Normal cardiovascular exam Rhythm:Regular Rate:Normal  1. Left ventricular ejection fraction, by estimation, is 50%. The left  ventricle has low normal function. The left ventricle has no regional wall  motion abnormalities. There is mild left ventricular hypertrophy. Left  ventricular diastolic parameters are  indeterminate.  2. Right ventricular systolic function is normal. The right ventricular  size is normal.  3. Left atrial size was mildly dilated.  4. A small pericardial effusion is present. The pericardial effusion is  circumferential.  5. The mitral valve is normal in structure. No evidence of mitral valve  regurgitation. No evidence of mitral stenosis.  6. The aortic valve is tricuspid. There is mild calcification of the  aortic valve. There is mild thickening of the aortic valve. Aortic valve  regurgitation is not visualized. No aortic stenosis is present.  7. The inferior vena cava is normal in size with greater than 50%  respiratory variability, suggesting right atrial pressure of 3 mmHg.   22-Jun-2020 20:47:25 [facility]-NLD ROUTINE RECORD Sinus tachycardia Borderline repolarization abnormality T wave abnormality Abnormal ECG Confirmed by Carmin Muskrat [phone]) on 06/22/2020 9:07:56 PM   Neuro/Psych negative  neurological ROS  negative psych ROS   GI/Hepatic GERD  Controlled,  Endo/Other  diabetes, Well Controlled, Type 2, Oral Hypoglycemic Agents  Renal/GU ESRFRenal disease     Musculoskeletal  (+) Arthritis ,   Abdominal   Peds  Hematology  (+) anemia ,   Anesthesia Other Findings   Reproductive/Obstetrics                            Anesthesia Physical Anesthesia Plan  ASA: IV  Anesthesia Plan: General   Post-op Pain Management:    Induction: Intravenous  PONV Risk Score and Plan: 3 and Ondansetron  Airway Management Planned: Oral ETT  Additional Equipment:   Intra-op Plan:   Post-operative Plan: Extubation in OR  Informed Consent: I have reviewed the patients History and Physical, chart, labs and discussed the procedure including the risks, benefits and alternatives for the proposed anesthesia with the patient or authorized representative who has indicated his/her understanding and acceptance.       Plan Discussed with: CRNA and Surgeon  Anesthesia Plan Comments:        Anesthesia Quick Evaluation

## 2020-06-24 NOTE — Progress Notes (Signed)
[facility]  CXR with catheter in good position and no PTX.  Talked to his mother and notified that the procedure was completed.  Dialysis team aware.   Curlene Labrum, MD [facility] [address] [place], [place] [address] [phone] (office)

## 2020-06-24 NOTE — Anesthesia Procedure Notes (Signed)
Procedure Name: Intubation Date/Time: 06/24/2020 12:54 PM Performed by: Hewitt Blade, CRNA Pre-anesthesia Checklist: Patient identified, Emergency Drugs available, Suction available and Patient being monitored Patient Re-evaluated:Patient Re-evaluated prior to induction Oxygen Delivery Method: Circle system utilized Preoxygenation: Pre-oxygenation with 100% oxygen Induction Type: IV induction Ventilation: Mask ventilation without difficulty Laryngoscope Size: Glidescope Grade View: Grade I Tube type: Oral Tube size: 7.5 mm Number of attempts: 1 Airway Equipment and Method: Oral airway and Rigid stylet Placement Confirmation: ETT inserted through vocal cords under direct vision,  positive ETCO2 and breath sounds checked- equal and bilateral Secured at: 22 cm Tube secured with: Tape Dental Injury: Teeth and Oropharynx as per pre-operative assessment

## 2020-06-24 NOTE — Progress Notes (Signed)
[facility] Progress Note  Patient: Kevin Meyer    [id number]  DOA: 06/22/2020     Date of Service: the patient was seen and examined on 06/24/2020  Brief hospital course: Past medical history of HTN, HLD, type II DM, CKD 4, morbid obesity. Presents with complaints of worsening shortness of breath and edema.  Found to have acute diastolic CHF and volume overload secondary to ESRD and HTN. Nephrology is also reconsulted. Currently plan is initiation of HD and monitoring for improvement in volume status.  Assessment and Plan: 1.  Acute on chronic diastolic CHF Continues to have significant volume overload. BNP elevated. Patient was started on IV Lasix which we will continue. Strict ins and outs and daily weight. Monitor renal function while the patient is requiring diuresis. Chest x-ray shows congestion.  No significant change in patient's condition. Echocardiogram done shows preserved EF without any wall motion abnormality or significant valvular abnormality. Continue volume removal with dialysis  2.  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 Follows up with nephrology outpatient. Had AV fistula placed outpatient last year. Nephrology consulted, appreciate assistance. Patient will be requiring initiation of hemodialysis given his progression to ESRD.  This was discussed with the patient and currently agreeable. Currently fistula is not functioning as expected and therefore patient will require a temporary HD catheter placement. Tunneled catheter placed by general surgery on 3/2 TOC following for outpatient dialysis arrangements  3.  Anemia of chronic kidney disease with iron and B12 deficiency anemia Hemoglobin significantly low.  Currently stable. Patient denies any acute bleeding. We will provide B12 injection. Iron also ordered with hemodialysis. Patient also receives  EPO injections with nephrology.  Management per nephrology.  4.  HLD Continue Lipitor.  5.  Essential hypertension Blood pressure stable. We will continue to monitor.  6.  Type 2 diabetes mellitus, not on long-term insulin dependence, uncontrolled with hyperglycemia and renal complication Holding home regimen per Currently on insulin sliding scale bolus regimen.  7.  Morbid obesity Placing the patient at high risk for poor outcome. Not a good candidate for subcutaneous injection due to anasarca therefore utilizing IM injection for B12. Body mass index is 45.69 kg/m.    Interventions:        Diet: Renal diet DVT Prophylaxis:   heparin injection 5,000 Units Start: 06/23/20 0600 SCDs Start: 06/22/20 2340    Advance goals of care discussion: Full code  Family Communication: Plan of care discussed with patient's mother at the bedside  Disposition:  Status is: Inpatient  Remains inpatient appropriate because:Ongoing diagnostic testing needed not appropriate for outpatient work up   Dispo: The patient is from: Home              Anticipated d/c is to: Home              Patient currently is not medically stable to d/c.   Difficult to place patient No        Subjective: Denies any chest pain at this time.  No nausea or vomiting.  Continues to have shortness of breath.  Physical Exam:  General exam: Alert, awake, oriented x 3 Respiratory system: Clear to auscultation. Respiratory effort normal. Cardiovascular system:RRR. No murmurs, rubs, gallops. Gastrointestinal system: Abdomen is nondistended, soft and nontender. No organomegaly or masses felt. Normal bowel sounds heard. Central nervous system: Alert and oriented. No focal neurological deficits. Extremities: Generalized anasarca and pedal edema  Skin: No rashes, lesions or ulcers Psychiatry: Judgement and insight appear normal. Mood & affect appropriate.    Vitals:   06/24/20 1700 06/24/20 1730 06/24/20 1800  06/24/20 1840  BP: 140/70 135/80 115/73 140/83  Pulse: 78 92 100 (!) 102  Resp: '18 20 20 20  '$ Temp:    98.4 F (36.9 C)  TempSrc:    Oral  SpO2:    100%  Weight:    128.4 kg  Height:        Intake/Output Summary (Last 24 hours) at 06/24/2020 2106 Last data filed at 06/24/2020 1900 Gross per 24 hour  Intake 930 ml  Output 3450 ml  Net -2520 ml   Filed Weights   06/24/20 0500 06/24/20 1610 06/24/20 1840  Weight: 130.4 kg 130.4 kg 128.4 kg    Data Reviewed: I have personally reviewed and interpreted daily labs, tele strips, imaging. I reviewed all nursing notes, pharmacy notes, vitals, pertinent old records I have discussed plan of care as described above with RN and patient/family.  CBC: Recent Labs  Lab 06/22/20 2057 06/23/20 0451  WBC 6.7 6.4  NEUTROABS 5.3  --   HGB 7.3* 7.2*  HCT 25.0* 24.8*  MCV 94.3 92.5  PLT 162 [id number]   Basic Metabolic Panel: Recent Labs  Lab 06/22/20 2057 06/23/20 0451  NA 136 138  K 4.6 4.6  CL 104 108  CO2 16* 15*  GLUCOSE 212* 135*  BUN 92* 99*  CREATININE 6.71* 6.62*  CALCIUM 7.5* 7.6*  MG  --  1.7  PHOS  --  9.2*    Studies: DG Chest Port 1 View  Result Date: 06/24/2020 CLINICAL DATA:  Dialysis catheter insertion. EXAM: PORTABLE CHEST 1 VIEW COMPARISON:  06/22/2020. FINDINGS: Right IJ dialysis catheter tip is at the SVC RA junction. No pneumothorax. Heart is enlarged, stable. Thoracic aorta is calcified. Small bilateral pleural effusions with bibasilar atelectasis, similar. IMPRESSION: 1. Right IJ dialysis catheter insertion without pneumothorax. 2. Small bilateral pleural effusions with bibasilar atelectasis. Electronically Signed   By: Lorin Picket M.D.   On: 06/24/2020 14:16   DG C-Arm 1-60 Min-No Report  Result Date: 06/24/2020 Fluoroscopy was utilized by the requesting physician.  No radiographic interpretation.    Scheduled Meds: . aspirin EC  81 mg Oral Daily  . atorvastatin  40 mg Oral Daily  . calcium acetate  1,334  mg Oral TID with meals  . Chlorhexidine Gluconate Cloth  6 each Topical Daily  . darbepoetin (ARANESP) injection - DIALYSIS  100 mcg Intravenous Q Wed-HD  . dextrose      . furosemide  80 mg Intravenous Q8H  . heparin  5,000 Units Subcutaneous Q8H  . insulin aspart  0-5 Units Subcutaneous QHS  . insulin aspart  0-9 Units Subcutaneous TID WC  . vitamin B-12  1,000 mcg Oral Daily   Continuous Infusions: . sodium chloride Stopped (06/24/20 1500)  . ceFAZolin    . ceFAZolin    . ferric gluconate (FERRLECIT/NULECIT) IV 125 mg (06/24/20 1739)   PRN Meds: acetaminophen, HYDROmorphone (DILAUDID) injection, ondansetron (ZOFRAN) IV, oxyCODONE  Time spent: 35 minutes  Author: Kathie Dike, MD [facility] 06/24/2020 9:06 PM  To reach On-call, see care teams to locate the attending and reach out via [email or web address]. Between 7PM-7AM, please contact night-coverage If you still have difficulty reaching the attending provider, please page the [facility] (Director on Call) for [facility] on amion for assistance.

## 2020-06-24 NOTE — Progress Notes (Signed)
Dialysis completed, per dialysis nurse Bo, 2 liters removed. VSS. Pt denies c/o. Given supper tray to eat.

## 2020-06-24 NOTE — Op Note (Signed)
Operative Note 06/24/20   Preoperative Diagnosis: End Stage Renal Disease    Postoperative Diagnosis: Same   Procedure(s) Performed: Tunneled Dialysis Catheter Placement, Right Internal Jugular    Surgeon: Lanell Matar. Constance Haw, MD   Assistants: No qualified resident was available   Anesthesia: General anesthesia    Anesthesiologist: Denese Killings, MD    Specimens: None   Estimated Blood Loss: Minimal   Fluoroscopy time: 2  seconds   Blood Replacement: None    Complications: None    Operative Findings:  Normal anatomy   Indications:  Mr. Parman is a 50 yo with worsening renal failure needing dialysis due to his fistula not working correctly. We discussed placement and risk of bleeding, infection, injury to vessels, and pneumothorax. He opted to proceed.  Procedure: The patient was brought into the operating room and anesthesia was induced.   The right chest and neck was prepped and draped in the usual sterile fashion.  Preoperative antibiotics were given.   An Ultrasound was used to verify that the right internal jugular vein was patent.  One percent lidocaine was used for local anesthesia.  The patient was measured and a 23 cm Palindrome dual lumen dialysis catheter.  The needles advanced into the right internal jugular vein using the Seldinger technique without difficulty.  A guidewire was then advanced into the right atrium under fluoroscopic guidance.  Ectopia was not noted.  The wire was secured.  An incision was made over the right chest and the catheter was tunneled to the neck.  The ultrasound again confirmed the wire was going into the vein only. Dilators were used over the wire to dilate the track.  An introducer and peel-away sheath were placed over the guidewire. The catheter was then inserted through the peel-away sheath and the peel-away sheath was removed.  A spot film was performed to confirm the position.  The catheter drew back and flushed easily. The lumens were  packed with heparin. Hemostats were used to position the catheter in the neck incision. The neck incision was closed with 4-0 Monocryl and Dermabond. The catheter was secured with 2-0 silk suture and a sterile Biopatch and dressing was applied.  Hemostasis was confirmed.     All tape and needle counts were correct at the end of the procedure. The patient was transferred to PACU in stable condition. A chest x-ray will be performed at that time.  Curlene Labrum, MD [facility] [address] [place], [place] [address] [phone] (office)

## 2020-06-24 NOTE — Progress Notes (Signed)
Pt down to OR via WC by surgical staff. Mother down to waiting room with pt.

## 2020-06-25 ENCOUNTER — Encounter (HOSPITAL_COMMUNITY): Payer: Self-pay | Admitting: General Surgery

## 2020-06-25 LAB — RENAL FUNCTION PANEL
Albumin: 3.3 g/dL — ABNORMAL LOW (ref 3.5–5.0)
Anion gap: 18 — ABNORMAL HIGH (ref 5–15)
BUN: 92 mg/dL — ABNORMAL HIGH (ref 6–20)
CO2: 16 mmol/L — ABNORMAL LOW (ref 22–32)
Calcium: 8.4 mg/dL — ABNORMAL LOW (ref 8.9–10.3)
Chloride: 103 mmol/L (ref 98–111)
Creatinine, Ser: 6.35 mg/dL — ABNORMAL HIGH (ref 0.61–1.24)
GFR, Estimated: 10 mL/min — ABNORMAL LOW (ref >60)
Glucose, Bld: 162 mg/dL — ABNORMAL HIGH (ref 70–99)
Phosphorus: 9.7 mg/dL — ABNORMAL HIGH (ref 2.5–4.6)
Potassium: 5.3 mmol/L — ABNORMAL HIGH (ref 3.5–5.1)
Sodium: 137 mmol/L (ref 135–145)

## 2020-06-25 LAB — CBC
HCT: 24.8 % — ABNORMAL LOW (ref 39.0–52.0)
Hemoglobin: 7.3 g/dL — ABNORMAL LOW (ref 13.0–17.0)
MCH: 27.4 pg (ref 26.0–34.0)
MCHC: 29.4 g/dL — ABNORMAL LOW (ref 30.0–36.0)
MCV: 93.2 fL (ref 80.0–100.0)
Platelets: 144 10*3/uL — ABNORMAL LOW (ref 150–400)
RBC: 2.66 MIL/uL — ABNORMAL LOW (ref 4.22–5.81)
RDW: 16 % — ABNORMAL HIGH (ref 11.5–15.5)
WBC: 4.9 10*3/uL (ref 4.0–10.5)
nRBC: 0 % (ref 0.0–0.2)

## 2020-06-25 LAB — GLUCOSE, CAPILLARY
Glucose-Capillary: 156 mg/dL — ABNORMAL HIGH (ref 70–99)
Glucose-Capillary: 169 mg/dL — ABNORMAL HIGH (ref 70–99)
Glucose-Capillary: 134 mg/dL — ABNORMAL HIGH (ref 70–99)
Glucose-Capillary: 164 mg/dL — ABNORMAL HIGH (ref 70–99)

## 2020-06-25 LAB — HEPATITIS B SURFACE ANTIBODY, QUANTITATIVE: Hep B S AB Quant (Post): <3.1 m[IU]/mL — ABNORMAL LOW (ref >9.9)

## 2020-06-25 MED ORDER — SODIUM CHLORIDE 0.9 % IV SOLN: 100.0000 mL | INTRAVENOUS | Status: DC | PRN

## 2020-06-25 MED ORDER — TORSEMIDE 20 MG PO TABS
100.0000 mg | ORAL_TABLET | Freq: Every morning | ORAL | Status: DC
  Administered 2020-06-26 – 2020-06-29 (×4): 100 mg via ORAL
  Filled 2020-06-25 (×5): qty 5

## 2020-06-25 MED ORDER — HEPARIN SODIUM (PORCINE) 1000 UNIT/ML DIALYSIS
3800.0000 [IU] | INTRAMUSCULAR | Status: DC | PRN
  Administered 2020-06-27 – 2020-06-29 (×2): 3800 [IU] via INTRAVENOUS_CENTRAL

## 2020-06-25 MED ORDER — HEPARIN SODIUM (PORCINE) 1000 UNIT/ML DIALYSIS
20.0000 [IU]/kg | INTRAMUSCULAR | Status: DC | PRN
  Administered 2020-06-25 – 2020-06-27 (×2): 2600 [IU] via INTRAVENOUS_CENTRAL

## 2020-06-25 MED ORDER — HEPARIN SODIUM (PORCINE) 1000 UNIT/ML DIALYSIS: 1000.0000 [IU] | INTRAMUSCULAR | Status: DC | PRN

## 2020-06-25 MED ORDER — PHENOL 1.4 % MT LIQD: 1.0000 | OROMUCOSAL | Status: DC | PRN

## 2020-06-25 NOTE — TOC Progression Note (Signed)
Transition of Care [facility]) - Progression Note   Patient Details  Name: Kevin Meyer MRN: [id number] Date of Birth: February 06, 1971  Transition of Care [facility]) CM/SW [place], LCSW Phone Number: 06/25/2020, 11:00 AM  Clinical Narrative: CLIP paperwork completed and clinicals faxed to [facility] for dialysis referral. TOC awaiting dialysis bed for patient.   Readmission Risk Interventions No flowsheet data found.

## 2020-06-25 NOTE — Progress Notes (Signed)
Admit: 06/22/2020 LOS: 3  23M CKD 5 presenting with massive volume overload, anemia, metabolic acidosis.  New ESRD  Subjective:  . Right IJ TDC placed yesterday . HD#1 yesterday, 2 L UF . No complaints this morning, feels improved . CLIP in process  03/02 0701 - 03/03 0700 In: 930 [P.O.:720; I.V.:210] Out: 2650 [Urine:650]  Filed Weights   06/24/20 0500 06/24/20 1610 06/24/20 1840  Weight: 130.4 kg 130.4 kg 128.4 kg    Scheduled Meds: . aspirin EC  81 mg Oral Daily  . atorvastatin  40 mg Oral Daily  . calcium acetate  1,334 mg Oral TID with meals  . Chlorhexidine Gluconate Cloth  6 each Topical Daily  . darbepoetin (ARANESP) injection - DIALYSIS  100 mcg Intravenous Q Wed-HD  . furosemide  80 mg Intravenous Q8H  . heparin  5,000 Units Subcutaneous Q8H  . insulin aspart  0-5 Units Subcutaneous QHS  . insulin aspart  0-9 Units Subcutaneous TID WC  . vitamin B-12  1,000 mcg Oral Daily   Continuous Infusions: . sodium chloride Stopped (06/24/20 1500)  . ferric gluconate (FERRLECIT/NULECIT) IV 125 mg (06/24/20 1739)   PRN Meds:.acetaminophen, HYDROmorphone (DILAUDID) injection, ondansetron (ZOFRAN) IV, oxyCODONE  Current Labs: reviewed    Physical Exam:  Blood pressure (!) 141/72, pulse 96, temperature 98.4 F (36.9 C), temperature source Oral, resp. rate 18, height '5\' 6"'$  (1.676 m), weight 128.4 kg, SpO2 100 %. GEN: Obese, not in acute distress, sitting in chair ENT: NCAT EYES: EOMI CV: Regular, no rub appreciated PULM:  Normal work of breathing, improved air movement in the bases, no wheezing ABD: Soft, nontender, diffuse abdominal wall swelling SKIN: No rashes or lesions EXT: 4+ edema in the legs, 3+ in the arms Vascular: Left radiocephalic AV fistula with bruit and thrill but appears to be deep  A 1. CKD 5, now ESRD:  1.  AVF not superficial or ready for cannulation, will req further eval with [facility] as an outpatient 2. 3/2 [facility] with Dr Constance Haw 3. HD#1 3/2 4. HD #2  today for ongoing UF, tight hep, 2K, 3L UF,  5. CLIP in process to outpatient dialysis, followed by Dr. Theador Hawthorne prior to presentation, plan is for [facility] 2. Massive volume overload 1. As above 2. In addition, cont diuretics, change to torsemide 100 qAM PO 3. Continue sodium and fluid restriction 3. Anemia CKD 1. Start Aranesp, 147mg weekly 2. Fe Gluconnate '125mg'$  IV x 4 3. Trend, no indication for transfusion 4. Likely CHF, related to #1, TTE LVEF 50%. Mostly this is #1 5. Metabolic acidosis: Will correct with dialysis, do not use sodium bicarbonate 6. CKD-BMD: cont C3 and CaAcetate, trend P 7. DM2 8. HTN  Plan 1. As above 2. Daily weights, Daily Renal Panel, Strict I/Os, Avoid nephrotoxins (NSAIDs, judicious IV Contrast)   RPearson GrippeMD 06/25/2020, 9:01 AM  Recent Labs  Lab 06/22/20 2057 06/23/20 0451 06/25/20 0511  NA 136 138 137  K 4.6 4.6 5.3*  CL 104 108 103  CO2 16* 15* 16*  GLUCOSE 212* 135* 162*  BUN 92* 99* 92*  CREATININE 6.71* 6.62* 6.35*  CALCIUM 7.5* 7.6* 8.4*  PHOS  --  9.2* 9.7*   Recent Labs  Lab 06/22/20 2057 06/23/20 0451 06/25/20 0511  WBC 6.7 6.4 4.9  NEUTROABS 5.3  --   --   HGB 7.3* 7.2* 7.3*  HCT 25.0* 24.8* 24.8*  MCV 94.3 92.5 93.2  PLT 162 165 144*

## 2020-06-25 NOTE — Procedures (Signed)
   HEMODIALYSIS TREATMENT NOTE:  Second HD session performed today via RIJ TDC.  Pt reports his first HD session yesterday went well.  "I lost 4.4 pounds!"  He reports less dyspnea and reduced swelling in face and hands.  "I can walk to the bathroom without huffing and puffing."  Catheter dressing was changed; exit site is unremarkable.  Cath tolerates prescribed flow with stable pressures.    3 hour session completed.  Goal met: 3 liters removed without interruption in UF.  SBPs 140s throughout session.  All blood was returned.   O2 was weaned off during treatment with pt saturating 96% on RA at rest.  After HD, pt stood to weigh on scale.  BP decreased to 110/60 p90 and spO2 decreased to 90%.  No dyspnea or lightheadedness.  O2 was re-started at 2L and pt was instructed call before ambulating or prolonged standing d/t potential orthostasis.  Report given to Reed Breech, RN  Rockwell Alexandria, RN

## 2020-06-25 NOTE — Progress Notes (Signed)
[facility] Progress Note  Patient: Kevin Meyer    [id number]  DOA: 06/22/2020     Date of Service: the patient was seen and examined on 06/25/2020  Brief hospital course: Past medical history of HTN, HLD, type II DM, CKD 4, morbid obesity. Presents with complaints of worsening shortness of breath and edema.  Found to have acute diastolic CHF and volume overload secondary to ESRD and HTN. Nephrology is also reconsulted. Currently plan is initiation of HD and monitoring for improvement in volume status.  Assessment and Plan: 1.  Acute on chronic diastolic CHF Continues to have significant volume overload. BNP elevated. Patient was started on IV Lasix which we will continue. Strict ins and outs and daily weight. Monitor renal function while the patient is requiring diuresis. Chest x-ray shows congestion.  No significant change in patient's condition. Echocardiogram done shows preserved EF without any wall motion abnormality or significant valvular abnormality. Continue volume removal with dialysis  2.  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 Follows up with nephrology outpatient. Had AV fistula placed outpatient last year. Nephrology consulted, appreciate assistance. Patient will be requiring initiation of hemodialysis given his progression to ESRD.  This was discussed with the patient and currently agreeable. Currently fistula is not functioning as expected and therefore patient will require a temporary HD catheter placement. Tunneled catheter placed by general surgery on 3/2 TOC following for outpatient dialysis arrangements  3.  Anemia of chronic kidney disease with iron and B12 deficiency anemia Hemoglobin significantly low.  Currently stable. Patient denies any acute bleeding. We will provide B12 injection. Iron also ordered with hemodialysis. Patient also receives  EPO injections with nephrology.  Management per nephrology.  4.  HLD Continue Lipitor.  5.  Essential hypertension Blood pressure stable. We will continue to monitor.  6.  Type 2 diabetes mellitus, not on long-term insulin dependence, uncontrolled with hyperglycemia and renal complication Holding home regimen per Currently on insulin sliding scale bolus regimen.  7.  Morbid obesity Placing the patient at high risk for poor outcome. Not a good candidate for subcutaneous injection due to anasarca therefore utilizing IM injection for B12. Body mass index is 44.51 kg/m.    Interventions:        Diet: Renal diet DVT Prophylaxis:   heparin injection 5,000 Units Start: 06/23/20 0600 SCDs Start: 06/22/20 2340    Advance goals of care discussion: Full code  Family Communication: Plan of care discussed with patient's mother at the bedside  Disposition:  Status is: Inpatient  Remains inpatient appropriate because:Ongoing diagnostic testing needed not appropriate for outpatient work up   Dispo: The patient is from: Home              Anticipated d/c is to: Home              Patient currently is not medically stable to d/c.   Difficult to place patient No        Subjective: Feels her shortness of breath is better today after dialysis session yesterday.  Currently on dialysis machine.  Physical Exam:  General exam: Alert, awake, oriented x 3 Respiratory system: Clear to auscultation. Respiratory effort normal. Cardiovascular system:RRR. No murmurs, rubs, gallops. Gastrointestinal system: Abdomen is nondistended, soft and nontender. No organomegaly or masses felt. Normal bowel sounds heard. Central nervous system: Alert and oriented. No focal neurological deficits. Extremities: Generalized anasarca.  Swelling and warmth has  improved since yesterday Skin: No rashes, lesions or ulcers Psychiatry: Judgement and insight appear normal. Mood & affect appropriate.      Vitals:   06/25/20 1700 06/25/20 1730 06/25/20 1800 06/25/20 1810  BP: 140/60 (!) 151/60 (!) 143/57 110/60  Pulse: 89 90 88 90  Resp:    18  Temp:      TempSrc:      SpO2: 96%     Weight:    125.1 kg  Height:        Intake/Output Summary (Last 24 hours) at 06/25/2020 2133 Last data filed at 06/25/2020 1800 Gross per 24 hour  Intake 240 ml  Output 3000 ml  Net -2760 ml   Filed Weights   06/24/20 1840 06/25/20 1450 06/25/20 1810  Weight: 128.4 kg 128.4 kg 125.1 kg    Data Reviewed: I have personally reviewed and interpreted daily labs, tele strips, imaging. I reviewed all nursing notes, pharmacy notes, vitals, pertinent old records I have discussed plan of care as described above with RN and patient/family.  CBC: Recent Labs  Lab 06/22/20 2057 06/23/20 0451 06/25/20 0511  WBC 6.7 6.4 4.9  NEUTROABS 5.3  --   --   HGB 7.3* 7.2* 7.3*  HCT 25.0* 24.8* 24.8*  MCV 94.3 92.5 93.2  PLT 162 165 [id number]*   Basic Metabolic Panel: Recent Labs  Lab 06/22/20 2057 06/23/20 0451 06/25/20 0511  NA 136 138 137  K 4.6 4.6 5.3*  CL 104 108 103  CO2 16* 15* 16*  GLUCOSE 212* 135* 162*  BUN 92* 99* 92*  CREATININE 6.71* 6.62* 6.35*  CALCIUM 7.5* 7.6* 8.4*  MG  --  1.7  --   PHOS  --  9.2* 9.7*    Studies: No results found.  Scheduled Meds: . aspirin EC  81 mg Oral Daily  . atorvastatin  40 mg Oral Daily  . calcium acetate  1,334 mg Oral TID with meals  . Chlorhexidine Gluconate Cloth  6 each Topical Daily  . darbepoetin (ARANESP) injection - DIALYSIS  100 mcg Intravenous Q Wed-HD  . heparin  5,000 Units Subcutaneous Q8H  . insulin aspart  0-5 Units Subcutaneous QHS  . insulin aspart  0-9 Units Subcutaneous TID WC  . torsemide  100 mg Oral q morning  . vitamin B-12  1,000 mcg Oral Daily   Continuous Infusions: . sodium chloride    . sodium chloride    . sodium chloride Stopped (06/24/20 1500)  . ferric gluconate (FERRLECIT/NULECIT) IV 125 mg (06/24/20 1739)    PRN Meds: sodium chloride, sodium chloride, acetaminophen, heparin, heparin, HYDROmorphone (DILAUDID) injection, ondansetron (ZOFRAN) IV, oxyCODONE, phenol  Time spent: 35 minutes  Author: Kathie Dike, MD [facility] 06/25/2020 9:33 PM  To reach On-call, see care teams to locate the attending and reach out via [email or web address]. Between 7PM-7AM, please contact night-coverage If you still have difficulty reaching the attending provider, please page the [facility] (Director on Call) for [facility] on amion for assistance.

## 2020-06-26 LAB — CBC
HCT: 24.5 % — ABNORMAL LOW (ref 39.0–52.0)
Hemoglobin: 7.3 g/dL — ABNORMAL LOW (ref 13.0–17.0)
MCH: 27 pg (ref 26.0–34.0)
MCHC: 29.8 g/dL — ABNORMAL LOW (ref 30.0–36.0)
MCV: 90.7 fL (ref 80.0–100.0)
Platelets: 142 10*3/uL — ABNORMAL LOW (ref 150–400)
RBC: 2.7 MIL/uL — ABNORMAL LOW (ref 4.22–5.81)
RDW: 15.9 % — ABNORMAL HIGH (ref 11.5–15.5)
WBC: 6.3 10*3/uL (ref 4.0–10.5)
nRBC: 0 % (ref 0.0–0.2)

## 2020-06-26 LAB — RENAL FUNCTION PANEL
Albumin: 3.3 g/dL — ABNORMAL LOW (ref 3.5–5.0)
Anion gap: 14 (ref 5–15)
BUN: 81 mg/dL — ABNORMAL HIGH (ref 6–20)
CO2: 22 mmol/L (ref 22–32)
Calcium: 8.3 mg/dL — ABNORMAL LOW (ref 8.9–10.3)
Chloride: 101 mmol/L (ref 98–111)
Creatinine, Ser: 4.89 mg/dL — ABNORMAL HIGH (ref 0.61–1.24)
GFR, Estimated: 14 mL/min — ABNORMAL LOW (ref >60)
Glucose, Bld: 144 mg/dL — ABNORMAL HIGH (ref 70–99)
Phosphorus: 5.8 mg/dL — ABNORMAL HIGH (ref 2.5–4.6)
Potassium: 3.8 mmol/L (ref 3.5–5.1)
Sodium: 137 mmol/L (ref 135–145)

## 2020-06-26 LAB — GLUCOSE, CAPILLARY
Glucose-Capillary: 156 mg/dL — ABNORMAL HIGH (ref 70–99)
Glucose-Capillary: 168 mg/dL — ABNORMAL HIGH (ref 70–99)
Glucose-Capillary: 151 mg/dL — ABNORMAL HIGH (ref 70–99)
Glucose-Capillary: 158 mg/dL — ABNORMAL HIGH (ref 70–99)

## 2020-06-26 NOTE — Progress Notes (Addendum)
Admit: 06/22/2020 LOS: 4  Kevin Meyer CKD 5 presenting with massive volume overload, anemia, metabolic acidosis.  New ESRD  Subjective:  . HD#3 yesterday, 3L . No complaints this morning, feels improved, UE swelling improving . CLIP in process  03/03 0701 - 03/04 0700 In: 240 [P.O.:240] Out: 3450 [Urine:450]  Filed Weights   06/25/20 1450 06/25/20 1810 06/26/20 0521  Weight: 128.4 kg 125.1 kg 124.5 kg    Scheduled Meds: . aspirin EC  81 mg Oral Daily  . atorvastatin  40 mg Oral Daily  . calcium acetate  1,334 mg Oral TID with meals  . Chlorhexidine Gluconate Cloth  6 each Topical Daily  . darbepoetin (ARANESP) injection - DIALYSIS  100 mcg Intravenous Q Wed-HD  . heparin  5,000 Units Subcutaneous Q8H  . insulin aspart  0-5 Units Subcutaneous QHS  . insulin aspart  0-9 Units Subcutaneous TID WC  . torsemide  100 mg Oral q morning  . vitamin B-12  1,000 mcg Oral Daily   Continuous Infusions: . sodium chloride    . sodium chloride    . sodium chloride Stopped (06/24/20 1500)  . ferric gluconate (FERRLECIT/NULECIT) IV 125 mg (06/24/20 1739)   PRN Meds:.sodium chloride, sodium chloride, acetaminophen, heparin, heparin, HYDROmorphone (DILAUDID) injection, ondansetron (ZOFRAN) IV, oxyCODONE, phenol  Current Labs: reviewed    Physical Exam:  Blood pressure 120/62, pulse 92, temperature 98.3 F (36.8 C), temperature source Oral, resp. rate 18, height '5\' 6"'  (1.676 m), weight 124.5 kg, SpO2 99 %. GEN: Obese, not in acute distress, sitting in chair ENT: NCAT EYES: EOMI CV: Regular, no rub appreciated PULM:  Normal work of breathing, improved air movement in the bases, no wheezing ABD: Soft, nontender, diffuse abdominal wall swelling SKIN: No rashes or lesions EXT: 4+ edema in the legs, trace in the arms Vascular: Left radiocephalic AV fistula with bruit and thrill but appears to be deep and not ready for cannulation  A 1. CKD 5, now ESRD:  1.  AVF not superficial or ready for  cannulation, will req further eval with [facility] as an outpatient 2. 3/2 [facility] with Dr Constance Haw 3. HDstart 3/2 4. HD #3 3/5: tight hep, 2K, 3-4L UF,  5. CLIP in process to outpatient dialysis, followed by Dr. Theador Hawthorne prior to presentation, plan is for [facility]  2. Massive volume overload 1. As above 2. In addition, cont diuretics, change to torsemide 100 qAM PO 3. Continue sodium and fluid restriction 3. Anemia CKD 1. Cont Aranesp, 119mg weekly 2. Fe Gluconnate 1265mIV x 4 in process 3. Trend, no indication for transfusion with Hb > 7 4. Likely CHF, related to #1, TTE LVEF 50%. Mostly this is #1 5. Metabolic acidosis: Corrected with dialysis, do not use sodium bicarbonate 6. CKD-BMD: cont C3 and CaAcetate, trend P 7. DM2 8. HTN, no meds, some IDH would not add BP meds at his time  Plan 1. As above 2. Will not actively follow over the weekend, please call with any questions or concerns. 3. Daily weights, Daily Renal Panel, Strict I/Os, Avoid nephrotoxins (NSAIDs, judicious IV Contrast)   RyPearson GrippeD 06/26/2020, 8:15 AM  Recent Labs  Lab 06/23/20 0451 06/25/20 0511 06/26/20 0506  NA 138 137 137  K 4.6 5.3* 3.8  CL 108 103 101  CO2 15* 16* 22  GLUCOSE 135* 162* 144*  BUN 99* 92* 81*  CREATININE 6.62* 6.35* 4.89*  CALCIUM 7.6* 8.4* 8.3*  PHOS 9.2* 9.7* 5.8*   Recent Labs  Lab  06/22/20 2057 06/23/20 0451 06/25/20 0511 06/26/20 0506  WBC 6.7 6.4 4.9 6.3  NEUTROABS 5.3  --   --   --   HGB 7.3* 7.2* 7.3* 7.3*  HCT 25.0* 24.8* 24.8* 24.5*  MCV 94.3 92.5 93.2 90.7  PLT 162 165 144* 142*

## 2020-06-26 NOTE — Progress Notes (Signed)
[facility] Progress Note  Patient: Kevin Meyer    [id number]  DOA: 06/22/2020     Date of Service: the patient was seen and examined on 06/26/2020  Brief hospital course: Past medical history of HTN, HLD, type II DM, CKD 4, morbid obesity. Presents with complaints of worsening shortness of breath and edema.  Found to have acute diastolic CHF and volume overload secondary to ESRD and HTN. Nephrology is also reconsulted. Currently plan is initiation of HD and monitoring for improvement in volume status.  Assessment and Plan: 1.  Acute on chronic diastolic CHF Continues to have significant volume overload. BNP elevated. Patient was started on IV Lasix which we will continue. Strict ins and outs and daily weight. Monitor renal function while the patient is requiring diuresis. Chest x-ray shows congestion.  No significant change in patient's condition. Echocardiogram done shows preserved EF without any wall motion abnormality or significant valvular abnormality. Continue volume removal with dialysis  2.  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 Follows up with nephrology outpatient. Had AV fistula placed outpatient last year. Nephrology consulted, appreciate assistance. Patient will be requiring initiation of hemodialysis given his progression to ESRD.  This was discussed with the patient and currently agreeable. Currently fistula is not functioning as expected and therefore patient will require a temporary HD catheter placement. Tunneled catheter placed by general surgery on 3/2 TOC following for outpatient dialysis arrangements  3.  Anemia of chronic kidney disease with iron and B12 deficiency anemia Hemoglobin significantly low.  Currently stable. Patient denies any acute bleeding. We will provide B12 injection. Iron also ordered with hemodialysis. Patient also receives  EPO injections with nephrology.  Management per nephrology.  4.  HLD Continue Lipitor.  5.  Essential hypertension Blood pressure stable. We will continue to monitor.  6.  Type 2 diabetes mellitus, not on long-term insulin dependence, uncontrolled with hyperglycemia and renal complication Holding home regimen per Currently on insulin sliding scale bolus regimen.  7.  Morbid obesity Placing the patient at high risk for poor outcome. Not a good candidate for subcutaneous injection due to anasarca therefore utilizing IM injection for B12. Body mass index is 44.31 kg/m.    Interventions:        Diet: Renal diet DVT Prophylaxis:   heparin injection 5,000 Units Start: 06/23/20 0600 SCDs Start: 06/22/20 2340    Advance goals of care discussion: Full code  Family Communication: Plan of care discussed with patient's mother at the bedside  Disposition:  Status is: Inpatient  Remains inpatient appropriate because:Ongoing diagnostic testing needed not appropriate for outpatient work up   Dispo: The patient is from: Home              Anticipated d/c is to: Home              Patient currently is not medically stable to d/c.   Difficult to place patient No        Subjective: Feels as though his breathing is slowly improving.  Not quite back to baseline yet.  Still gets short of breath with some exertion.  Physical Exam:  General exam: Alert, awake, oriented x 3 Respiratory system: Crackles at bases. Respiratory effort normal. Cardiovascular system:RRR. No murmurs, rubs, gallops. Gastrointestinal system: Abdomen is nondistended, soft and nontender. No organomegaly or masses felt. Normal bowel sounds heard. Central nervous system: Alert and oriented. No focal neurological deficits. Extremities:  2+ lower extremity edema Skin: No rashes, lesions or ulcers Psychiatry: Judgement and insight appear normal. Mood & affect appropriate.      Vitals:   06/25/20 1810 06/25/20  2135 06/26/20 0521 06/26/20 1349  BP: 110/60 (!) 123/57 120/62 114/64  Pulse: 90 90 92 (!) 101  Resp: '18 18 18 18  '$ Temp:  98.2 F (36.8 C) 98.3 F (36.8 C) 99 F (37.2 C)  TempSrc:  Oral Oral Oral  SpO2:  99% 99% (!) 89%  Weight: 125.1 kg  124.5 kg   Height:        Intake/Output Summary (Last 24 hours) at 06/26/2020 1957 Last data filed at 06/26/2020 1800 Gross per 24 hour  Intake 480 ml  Output 900 ml  Net -420 ml   Filed Weights   06/25/20 1450 06/25/20 1810 06/26/20 0521  Weight: 128.4 kg 125.1 kg 124.5 kg    Data Reviewed: I have personally reviewed and interpreted daily labs, tele strips, imaging. I reviewed all nursing notes, pharmacy notes, vitals, pertinent old records I have discussed plan of care as described above with RN and patient/family.  CBC: Recent Labs  Lab 06/22/20 2057 06/23/20 0451 06/25/20 0511 06/26/20 0506  WBC 6.7 6.4 4.9 6.3  NEUTROABS 5.3  --   --   --   HGB 7.3* 7.2* 7.3* 7.3*  HCT 25.0* 24.8* 24.8* 24.5*  MCV 94.3 92.5 93.2 90.7  PLT 162 165 144* [id number]*   Basic Metabolic Panel: Recent Labs  Lab 06/22/20 2057 06/23/20 0451 06/25/20 0511 06/26/20 0506  NA 136 138 137 137  K 4.6 4.6 5.3* 3.8  CL 104 108 103 101  CO2 16* 15* 16* 22  GLUCOSE 212* 135* 162* 144*  BUN 92* 99* 92* 81*  CREATININE 6.71* 6.62* 6.35* 4.89*  CALCIUM 7.5* 7.6* 8.4* 8.3*  MG  --  1.7  --   --   PHOS  --  9.2* 9.7* 5.8*    Studies: No results found.  Scheduled Meds: . aspirin EC  81 mg Oral Daily  . atorvastatin  40 mg Oral Daily  . calcium acetate  1,334 mg Oral TID with meals  . Chlorhexidine Gluconate Cloth  6 each Topical Daily  . darbepoetin (ARANESP) injection - DIALYSIS  100 mcg Intravenous Q Wed-HD  . heparin  5,000 Units Subcutaneous Q8H  . insulin aspart  0-5 Units Subcutaneous QHS  . insulin aspart  0-9 Units Subcutaneous TID WC  . torsemide  100 mg Oral q morning  . vitamin B-12  1,000 mcg Oral Daily   Continuous Infusions: . sodium  chloride    . sodium chloride    . sodium chloride Stopped (06/24/20 1500)  . ferric gluconate (FERRLECIT/NULECIT) IV Stopped (06/26/20 1533)   PRN Meds: sodium chloride, sodium chloride, acetaminophen, heparin, heparin, HYDROmorphone (DILAUDID) injection, ondansetron (ZOFRAN) IV, oxyCODONE, phenol  Time spent: 35 minutes  Author: Kathie Dike, MD [facility] 06/26/2020 7:57 PM  To reach On-call, see care teams to locate the attending and reach out via [email or web address]. Between 7PM-7AM, please contact night-coverage If you still have difficulty reaching the attending provider, please page the [facility] (Director on Call) for [facility] on amion for assistance.

## 2020-06-27 LAB — CBC
HCT: 23.4 % — ABNORMAL LOW (ref 39.0–52.0)
Hemoglobin: 7.2 g/dL — ABNORMAL LOW (ref 13.0–17.0)
MCH: 28.3 pg (ref 26.0–34.0)
MCHC: 30.8 g/dL (ref 30.0–36.0)
MCV: 92.1 fL (ref 80.0–100.0)
Platelets: 149 10*3/uL — ABNORMAL LOW (ref 150–400)
RBC: 2.54 MIL/uL — ABNORMAL LOW (ref 4.22–5.81)
RDW: 15.9 % — ABNORMAL HIGH (ref 11.5–15.5)
WBC: 6.4 10*3/uL (ref 4.0–10.5)
nRBC: 0 % (ref 0.0–0.2)

## 2020-06-27 LAB — RENAL FUNCTION PANEL
Albumin: 3.2 g/dL — ABNORMAL LOW (ref 3.5–5.0)
Anion gap: 14 (ref 5–15)
BUN: 89 mg/dL — ABNORMAL HIGH (ref 6–20)
CO2: 21 mmol/L — ABNORMAL LOW (ref 22–32)
Calcium: 8 mg/dL — ABNORMAL LOW (ref 8.9–10.3)
Chloride: 100 mmol/L (ref 98–111)
Creatinine, Ser: 5.58 mg/dL — ABNORMAL HIGH (ref 0.61–1.24)
GFR, Estimated: 12 mL/min — ABNORMAL LOW (ref >60)
Glucose, Bld: 187 mg/dL — ABNORMAL HIGH (ref 70–99)
Phosphorus: 4.8 mg/dL — ABNORMAL HIGH (ref 2.5–4.6)
Potassium: 3.7 mmol/L (ref 3.5–5.1)
Sodium: 135 mmol/L (ref 135–145)

## 2020-06-27 LAB — GLUCOSE, CAPILLARY
Glucose-Capillary: 135 mg/dL — ABNORMAL HIGH (ref 70–99)
Glucose-Capillary: 150 mg/dL — ABNORMAL HIGH (ref 70–99)
Glucose-Capillary: 161 mg/dL — ABNORMAL HIGH (ref 70–99)
Glucose-Capillary: 164 mg/dL — ABNORMAL HIGH (ref 70–99)

## 2020-06-27 NOTE — Progress Notes (Signed)
[facility] Progress Note  Patient: Kevin Meyer    [id number]  DOA: 06/22/2020     Date of Service: the patient was seen and examined on 06/27/2020  Brief hospital course: Past medical history of HTN, HLD, type II DM, CKD 4, morbid obesity. Presents with complaints of worsening shortness of breath and edema.  Found to have acute diastolic CHF and volume overload secondary to ESRD and HTN. Nephrology is also reconsulted. Currently plan is initiation of HD and monitoring for improvement in volume status.  Assessment and Plan: 1.  Acute on chronic diastolic CHF Continues to have significant volume overload. BNP elevated. Patient was started on IV Lasix which we will continue. Strict ins and outs and daily weight. Monitor renal function while the patient is requiring diuresis. Chest x-ray shows congestion.  No significant change in patient's condition. Echocardiogram done shows preserved EF without any wall motion abnormality or significant valvular abnormality. Continue volume removal with dialysis  2.  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 Follows up with nephrology outpatient. Had AV fistula placed outpatient last year. Nephrology consulted, appreciate assistance. Patient will be requiring initiation of hemodialysis given his progression to ESRD.  This was discussed with the patient and currently agreeable. Currently fistula is not functioning as expected and therefore patient will require a temporary HD catheter placement. Tunneled catheter placed by general surgery on 3/2 TOC following for outpatient dialysis arrangements  3.  Anemia of chronic kidney disease with iron and B12 deficiency anemia Hemoglobin significantly low.  Currently stable. Patient denies any acute bleeding. We will provide B12 injection. Iron also ordered with hemodialysis. Patient also receives  EPO injections with nephrology.  Management per nephrology.  4.  HLD Continue Lipitor.  5.  Essential hypertension Blood pressure stable. We will continue to monitor.  6.  Type 2 diabetes mellitus, not on long-term insulin dependence, uncontrolled with hyperglycemia and renal complication Holding home regimen per Currently on insulin sliding scale bolus regimen.  7.  Morbid obesity Placing the patient at high risk for poor outcome. Not a good candidate for subcutaneous injection due to anasarca therefore utilizing IM injection for B12. Body mass index is 44.31 kg/m.    Interventions:        Diet: Renal diet DVT Prophylaxis:   heparin injection 5,000 Units Start: 06/23/20 0600 SCDs Start: 06/22/20 2340    Advance goals of care discussion: Full code  Family Communication: Plan of care discussed with patient's mother at the bedside  Disposition:  Status is: Inpatient  Remains inpatient appropriate because:Ongoing diagnostic testing needed not appropriate for outpatient work up   Dispo: The patient is from: Home              Anticipated d/c is to: Home              Patient currently is not medically stable to d/c.   Difficult to place patient No        Subjective: No complaints of chest pain.  Feels her breathing continues to improve.  Still has some lower extremity edema.  Physical Exam:  General exam: Alert, awake, oriented x 3 Respiratory system: Crackles at bases. Respiratory effort normal. Cardiovascular system:RRR. No murmurs, rubs, gallops. Gastrointestinal system: Abdomen is nondistended, soft and nontender. No organomegaly or masses felt. Normal bowel sounds heard. Central nervous system: Alert and oriented. No focal neurological deficits. Extremities: 2-3+ lower extremity edema Skin:  No rashes, lesions or ulcers Psychiatry: Judgement and insight appear normal. Mood & affect appropriate.      Vitals:   06/27/20 1700 06/27/20 1730 06/27/20 1800  06/27/20 1815  BP: 137/78 137/71 125/66 128/69  Pulse: 88 87 83 84  Resp:      Temp:      TempSrc:      SpO2:      Weight:      Height:        Intake/Output Summary (Last 24 hours) at 06/27/2020 1929 Last data filed at 06/27/2020 1900 Gross per 24 hour  Intake 720 ml  Output 4350 ml  Net -3630 ml   Filed Weights   06/25/20 1450 06/25/20 1810 06/26/20 0521  Weight: 128.4 kg 125.1 kg 124.5 kg    Data Reviewed: I have personally reviewed and interpreted daily labs, tele strips, imaging. I reviewed all nursing notes, pharmacy notes, vitals, pertinent old records I have discussed plan of care as described above with RN and patient/family.  CBC: Recent Labs  Lab 06/22/20 2057 06/23/20 0451 06/25/20 0511 06/26/20 0506 06/27/20 1551  WBC 6.7 6.4 4.9 6.3 6.4  NEUTROABS 5.3  --   --   --   --   HGB 7.3* 7.2* 7.3* 7.3* 7.2*  HCT 25.0* 24.8* 24.8* 24.5* 23.4*  MCV 94.3 92.5 93.2 90.7 92.1  PLT 162 165 144* 142* 123456*   Basic Metabolic Panel: Recent Labs  Lab 06/22/20 2057 06/23/20 0451 06/25/20 0511 06/26/20 0506 06/27/20 1551  NA 136 138 137 137 135  K 4.6 4.6 5.3* 3.8 3.7  CL 104 108 103 101 100  CO2 16* 15* 16* 22 21*  GLUCOSE 212* 135* 162* 144* 187*  BUN 92* 99* 92* 81* 89*  CREATININE 6.71* 6.62* 6.35* 4.89* 5.58*  CALCIUM 7.5* 7.6* 8.4* 8.3* 8.0*  MG  --  1.7  --   --   --   PHOS  --  9.2* 9.7* 5.8* 4.8*    Studies: No results found.  Scheduled Meds: . aspirin EC  81 mg Oral Daily  . atorvastatin  40 mg Oral Daily  . calcium acetate  1,334 mg Oral TID with meals  . Chlorhexidine Gluconate Cloth  6 each Topical Daily  . darbepoetin (ARANESP) injection - DIALYSIS  100 mcg Intravenous Q Wed-HD  . heparin  5,000 Units Subcutaneous Q8H  . insulin aspart  0-5 Units Subcutaneous QHS  . insulin aspart  0-9 Units Subcutaneous TID WC  . torsemide  100 mg Oral q morning  . vitamin B-12  1,000 mcg Oral Daily   Continuous Infusions: . sodium chloride    .  sodium chloride    . sodium chloride Stopped (06/24/20 1500)  . ferric gluconate (FERRLECIT/NULECIT) IV 125 mg (06/27/20 0909)   PRN Meds: sodium chloride, sodium chloride, acetaminophen, heparin, heparin, HYDROmorphone (DILAUDID) injection, ondansetron (ZOFRAN) IV, oxyCODONE, phenol  Time spent: 35 minutes  Author: Kathie Dike, MD [facility] 06/27/2020 7:29 PM  To reach On-call, see care teams to locate the attending and reach out via [email or web address]. Between 7PM-7AM, please contact night-coverage If you still have difficulty reaching the attending provider, please page the [facility] (Director on Call) for [facility] on amion for assistance.

## 2020-06-27 NOTE — Procedures (Signed)
   HEMODIALYSIS TREATMENT NOTE:  Uneventful 3.5 hour HD completed with 4 liters removed.  No interruption in UF; hemodynamically stable during treatment in recliner.  All blood was returned.  Rockwell Alexandria, RN

## 2020-06-28 LAB — GLUCOSE, CAPILLARY
Glucose-Capillary: 148 mg/dL — ABNORMAL HIGH (ref 70–99)
Glucose-Capillary: 148 mg/dL — ABNORMAL HIGH (ref 70–99)
Glucose-Capillary: 193 mg/dL — ABNORMAL HIGH (ref 70–99)
Glucose-Capillary: 154 mg/dL — ABNORMAL HIGH (ref 70–99)

## 2020-06-28 MED ORDER — CHLORHEXIDINE GLUCONATE CLOTH 2 % EX PADS
6.0000 | MEDICATED_PAD | Freq: Every day | CUTANEOUS | Status: DC
  Administered 2020-06-29: 6 via TOPICAL

## 2020-06-28 NOTE — TOC Progression Note (Signed)
Transition of Care [facility]) - Progression Note    Patient Details  Name: Kevin Meyer MRN: [id number] Date of Birth: 27-Jun-1970  Transition of Care [facility]) CM/SW Contact  Natasha Bence, LCSW Phone Number: 06/28/2020, 3:20 PM  Clinical Narrative:    [facility] reported that they did not receive nephrology note and reported that they had the incorrect birthday as [birth year] and required Medicaid card. CSW faxed medicaid card and nephorology note.   Addendum  [facility] reported hat they did not have a chair time for patient, but had received fax. [facility] also reported that they would not be available on the weekend. TOC to follow.        Expected Discharge Plan and Services                                                 Social Determinants of Health (SDOH) Interventions    Readmission Risk Interventions Readmission Risk Prevention Plan 06/25/2020  Transportation Screening Complete  HRI or [place] Complete  Social Work Consult for [place] Planning/Counseling Complete  Some recent data might be hidden

## 2020-06-28 NOTE — Progress Notes (Signed)
[facility] Progress Note  Patient: Kevin Meyer    [id number]  DOA: 06/22/2020     Date of Service: the patient was seen and examined on 06/28/2020  Brief hospital course: Past medical history of HTN, HLD, type II DM, CKD 4, morbid obesity. Presents with complaints of worsening shortness of breath and edema.  Found to have acute diastolic CHF and volume overload secondary to ESRD and HTN. Nephrology is also reconsulted. Currently plan is initiation of HD and monitoring for improvement in volume status.  Assessment and Plan: 1.  Acute on chronic diastolic CHF Continues to have significant volume overload. BNP elevated. Patient was started on IV Lasix which we will continue. Strict ins and outs and daily weight. Monitor renal function while the patient is requiring diuresis. Chest x-ray shows congestion.  No significant change in patient's condition. Echocardiogram done shows preserved EF without any wall motion abnormality or significant valvular abnormality. Continue volume removal with dialysis  2.  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 Follows up with nephrology outpatient. Had AV fistula placed outpatient last year. Nephrology consulted, appreciate assistance. Patient will be requiring initiation of hemodialysis given his progression to ESRD.  This was discussed with the patient and currently agreeable. Currently fistula is not functioning as expected and therefore patient will require a temporary HD catheter placement. Tunneled catheter placed by general surgery on 3/2 TOC following for outpatient dialysis arrangements  3.  Anemia of chronic kidney disease with iron and B12 deficiency anemia Hemoglobin significantly low.  Currently stable. Patient denies any acute bleeding. We will provide B12 injection. Iron also ordered with hemodialysis. Patient also receives  EPO injections with nephrology.  Management per nephrology.  4.  HLD Continue Lipitor.  5.  Essential hypertension Blood pressure stable. We will continue to monitor.  6.  Type 2 diabetes mellitus, not on long-term insulin dependence, uncontrolled with hyperglycemia and renal complication Holding home regimen per Currently on insulin sliding scale bolus regimen.  7.  Morbid obesity Placing the patient at high risk for poor outcome. Not a good candidate for subcutaneous injection due to anasarca therefore utilizing IM injection for B12. Body mass index is 43.59 kg/m.    Interventions:        Diet: Renal diet DVT Prophylaxis:   heparin injection 5,000 Units Start: 06/23/20 0600 SCDs Start: 06/22/20 2340    Advance goals of care discussion: Full code  Family Communication: Plan of care discussed with patient's mother at the bedside  Disposition:  Status is: Inpatient  Remains inpatient appropriate because:Ongoing diagnostic testing needed not appropriate for outpatient work up   Dispo: The patient is from: Home              Anticipated d/c is to: Home              Patient currently is not medically stable to d/c.   Difficult to place patient No        Subjective: Feels that his breathing is improving.  Able to ambulate without shortness of breath.  He did sleep in the recliner which he says he does at home.  Physical Exam:  General exam: Alert, awake, oriented x 3 Respiratory system: Clear to auscultation. Respiratory effort normal. Cardiovascular system:RRR. No murmurs, rubs, gallops. Gastrointestinal system: Abdomen is nondistended, soft and nontender. No organomegaly or masses felt. Normal bowel sounds heard. Central nervous system: Alert and oriented. No  focal neurological deficits. Extremities: 2-3+ pitting edema lower extremities Skin: No rashes, lesions or ulcers Psychiatry: Judgement and insight appear normal. Mood & affect appropriate.    Vitals:    06/27/20 2020 06/28/20 0500 06/28/20 0512 06/28/20 1345  BP: 131/69  (!) 116/57 120/61  Pulse: 93  87 97  Resp: '18  17 18  '$ Temp: 98.3 F (36.8 C)  98.7 F (37.1 C) 98.7 F (37.1 C)  TempSrc: Oral  Oral Oral  SpO2: 95%  94% 97%  Weight:  122.5 kg    Height:        Intake/Output Summary (Last 24 hours) at 06/28/2020 2009 Last data filed at 06/28/2020 1300 Gross per 24 hour  Intake 480 ml  Output 100 ml  Net 380 ml   Filed Weights   06/26/20 0521 06/27/20 1820 06/28/20 0500  Weight: 124.5 kg 122.6 kg 122.5 kg    Data Reviewed: I have personally reviewed and interpreted daily labs, tele strips, imaging. I reviewed all nursing notes, pharmacy notes, vitals, pertinent old records I have discussed plan of care as described above with RN and patient/family.  CBC: Recent Labs  Lab 06/22/20 2057 06/23/20 0451 06/25/20 0511 06/26/20 0506 06/27/20 1551  WBC 6.7 6.4 4.9 6.3 6.4  NEUTROABS 5.3  --   --   --   --   HGB 7.3* 7.2* 7.3* 7.3* 7.2*  HCT 25.0* 24.8* 24.8* 24.5* 23.4*  MCV 94.3 92.5 93.2 90.7 92.1  PLT 162 165 144* 142* 123456*   Basic Metabolic Panel: Recent Labs  Lab 06/22/20 2057 06/23/20 0451 06/25/20 0511 06/26/20 0506 06/27/20 1551  NA 136 138 137 137 135  K 4.6 4.6 5.3* 3.8 3.7  CL 104 108 103 101 100  CO2 16* 15* 16* 22 21*  GLUCOSE 212* 135* 162* 144* 187*  BUN 92* 99* 92* 81* 89*  CREATININE 6.71* 6.62* 6.35* 4.89* 5.58*  CALCIUM 7.5* 7.6* 8.4* 8.3* 8.0*  MG  --  1.7  --   --   --   PHOS  --  9.2* 9.7* 5.8* 4.8*    Studies: No results found.  Scheduled Meds: . aspirin EC  81 mg Oral Daily  . atorvastatin  40 mg Oral Daily  . calcium acetate  1,334 mg Oral TID with meals  . Chlorhexidine Gluconate Cloth  6 each Topical Daily  . [START ON 06/29/2020] Chlorhexidine Gluconate Cloth  6 each Topical Q0600  . darbepoetin (ARANESP) injection - DIALYSIS  100 mcg Intravenous Q Wed-HD  . heparin  5,000 Units Subcutaneous Q8H  . insulin aspart  0-5 Units  Subcutaneous QHS  . insulin aspart  0-9 Units Subcutaneous TID WC  . torsemide  100 mg Oral q morning  . vitamin B-12  1,000 mcg Oral Daily   Continuous Infusions: . sodium chloride    . sodium chloride    . sodium chloride Stopped (06/24/20 1500)  . ferric gluconate (FERRLECIT/NULECIT) IV 125 mg (06/27/20 0909)   PRN Meds: sodium chloride, sodium chloride, acetaminophen, heparin, heparin, HYDROmorphone (DILAUDID) injection, ondansetron (ZOFRAN) IV, oxyCODONE, phenol  Time spent: 35 minutes  Author: Kathie Dike, MD [facility] 06/28/2020 8:09 PM  To reach On-call, see care teams to locate the attending and reach out via [email or web address]. Between 7PM-7AM, please contact night-coverage If you still have difficulty reaching the attending provider, please page the [facility] (Director on Call) for [facility] on amion for assistance.

## 2020-06-28 NOTE — Progress Notes (Signed)
Admit: 06/22/2020 LOS: 6  68M CKD 5 presenting with massive volume overload, anemia, metabolic acidosis.  New ESRD  Subjective:  . HD#3 yesterday, removed 4L . No complaints this morning, feels improved, UE swelling improving- still with significant LE swelling  . CLIP in process  03/05 0701 - 03/06 0700 In: 600 [P.O.:600] Out: 4350 [Urine:350]  Filed Weights   06/26/20 0521 06/27/20 1820 06/28/20 0500  Weight: 124.5 kg 122.6 kg 122.5 kg    Scheduled Meds: . aspirin EC  81 mg Oral Daily  . atorvastatin  40 mg Oral Daily  . calcium acetate  1,334 mg Oral TID with meals  . Chlorhexidine Gluconate Cloth  6 each Topical Daily  . darbepoetin (ARANESP) injection - DIALYSIS  100 mcg Intravenous Q Wed-HD  . heparin  5,000 Units Subcutaneous Q8H  . insulin aspart  0-5 Units Subcutaneous QHS  . insulin aspart  0-9 Units Subcutaneous TID WC  . torsemide  100 mg Oral q morning  . vitamin B-12  1,000 mcg Oral Daily   Continuous Infusions: . sodium chloride    . sodium chloride    . sodium chloride Stopped (06/24/20 1500)  . ferric gluconate (FERRLECIT/NULECIT) IV 125 mg (06/27/20 0909)   PRN Meds:.sodium chloride, sodium chloride, acetaminophen, heparin, heparin, HYDROmorphone (DILAUDID) injection, ondansetron (ZOFRAN) IV, oxyCODONE, phenol  Current Labs: reviewed    Physical Exam:  Blood pressure (!) 116/57, pulse 87, temperature 98.7 F (37.1 C), temperature source Oral, resp. rate 17, height '5\' 6"'  (1.676 m), weight 122.5 kg, SpO2 94 %. GEN: Obese, not in acute distress, sitting in chair ENT: NCAT EYES: EOMI CV: Regular, no rub appreciated PULM:  Normal work of breathing, improved air movement in the bases, no wheezing ABD: Soft, nontender, diffuse abdominal wall swelling SKIN: No rashes or lesions EXT: 4+ edema in the legs, trace in the arms Vascular: Left radiocephalic AV fistula with bruit and thrill but appears to be deep and not ready for cannulation  A 1. CKD 5, now  ESRD:  1.  AVF not superficial or ready for cannulation, will req further eval with [facility] as an outpatient 2. 3/2 [facility] with Dr Constance Haw 3. HDstart 3/2 4. HD #3 3/5: tight hep, 2K, 3-4L UF,  5. CLIP in process to outpatient dialysis, followed by Dr. Theador Hawthorne prior to presentation, plan is for [facility] - may be ready for discharge once has a spot  6. Will plan for another HD tomorrow- Monday  2. Massive volume overload 1. As above 2. In addition, cont diuretics since making some urine, change to torsemide 100 qAM PO 3. Continue sodium and fluid restriction 3. Anemia CKD 1. Cont Aranesp, 150mg weekly 2. Fe Gluconnate 1228mIV x 4 in process 3. Trend, no indication for transfusion with Hb > 7 4. Likely CHF, related to #1, TTE LVEF 50%. Mostly this is #1 5. Metabolic acidosis: Corrected with dialysis 6. CKD-BMD: cont C3 and CaAcetate, trend P-  PTH 188 7. DM2 8. HTN, no meds, some IDH would not add BP meds at his time    KeLouis Meckel3/09/2020, 11:19 AM  Recent Labs  Lab 06/25/20 0511 06/26/20 0506 06/27/20 1551  NA 137 137 135  K 5.3* 3.8 3.7  CL 103 101 100  CO2 16* 22 21*  GLUCOSE 162* 144* 187*  BUN 92* 81* 89*  CREATININE 6.35* 4.89* 5.58*  CALCIUM 8.4* 8.3* 8.0*  PHOS 9.7* 5.8* 4.8*   Recent Labs  Lab 06/22/20 2057 06/23/20  0451 06/25/20 0511 06/26/20 0506 06/27/20 1551  WBC 6.7   < > 4.9 6.3 6.4  NEUTROABS 5.3  --   --   --   --   HGB 7.3*   < > 7.3* 7.3* 7.2*  HCT 25.0*   < > 24.8* 24.5* 23.4*  MCV 94.3   < > 93.2 90.7 92.1  PLT 162   < > 144* 142* 149*   < > = values in this interval not displayed.

## 2020-06-29 ENCOUNTER — Encounter (HOSPITAL_COMMUNITY): Admission: RE | Admit: 2020-06-29 | Payer: Medicaid Other | Source: Ambulatory Visit

## 2020-06-29 ENCOUNTER — Encounter (HOSPITAL_COMMUNITY): Admit: 2020-06-29 | Payer: Medicaid Other

## 2020-06-29 LAB — RENAL FUNCTION PANEL
Albumin: 3.3 g/dL — ABNORMAL LOW (ref 3.5–5.0)
Anion gap: 11 (ref 5–15)
BUN: 66 mg/dL — ABNORMAL HIGH (ref 6–20)
CO2: 26 mmol/L (ref 22–32)
Calcium: 8.9 mg/dL (ref 8.9–10.3)
Chloride: 101 mmol/L (ref 98–111)
Creatinine, Ser: 4.58 mg/dL — ABNORMAL HIGH (ref 0.61–1.24)
GFR, Estimated: 15 mL/min — ABNORMAL LOW (ref >60)
Glucose, Bld: 143 mg/dL — ABNORMAL HIGH (ref 70–99)
Phosphorus: 4.1 mg/dL (ref 2.5–4.6)
Potassium: 4 mmol/L (ref 3.5–5.1)
Sodium: 138 mmol/L (ref 135–145)

## 2020-06-29 LAB — CBC
HCT: 26.3 % — ABNORMAL LOW (ref 39.0–52.0)
Hemoglobin: 7.5 g/dL — ABNORMAL LOW (ref 13.0–17.0)
MCH: 27 pg (ref 26.0–34.0)
MCHC: 28.5 g/dL — ABNORMAL LOW (ref 30.0–36.0)
MCV: 94.6 fL (ref 80.0–100.0)
Platelets: 135 10*3/uL — ABNORMAL LOW (ref 150–400)
RBC: 2.78 MIL/uL — ABNORMAL LOW (ref 4.22–5.81)
RDW: 16 % — ABNORMAL HIGH (ref 11.5–15.5)
WBC: 7.2 10*3/uL (ref 4.0–10.5)
nRBC: 0 % (ref 0.0–0.2)

## 2020-06-29 LAB — GLUCOSE, CAPILLARY
Glucose-Capillary: 121 mg/dL — ABNORMAL HIGH (ref 70–99)
Glucose-Capillary: 141 mg/dL — ABNORMAL HIGH (ref 70–99)

## 2020-06-29 MED ORDER — TORSEMIDE 100 MG PO TABS: 100.0000 mg | ORAL_TABLET | Freq: Every morning | ORAL | 0 refills | Status: DC

## 2020-06-29 MED ORDER — FOLIC ACID 1 MG PO TABS: 1.0000 mg | ORAL_TABLET | Freq: Every day | ORAL | 3 refills | Status: AC

## 2020-06-29 MED ORDER — CYANOCOBALAMIN 1000 MCG PO TABS: 1000.0000 ug | ORAL_TABLET | Freq: Every day | ORAL | 1 refills | Status: AC

## 2020-06-29 NOTE — Progress Notes (Deleted)
Physician Discharge Summary  Kevin Meyer [id number] DOB: 1970-06-28 DOA: 06/22/2020  PCP: Royce Macadamia D., PA-C  Admit date: 06/22/2020 Discharge date: 06/29/2020  Admitted From: Home Disposition: Home  Recommendations for Outpatient Follow-up:  1. Follow up with PCP in 1-2 weeks 2. Please obtain BMP/CBC in one week 3. Patient has been established with [facility] in [place].  He will be on a Monday, Wednesday, Friday schedule 4. Permanent dialysis access will need to be addressed as an outpatient  Home Health: Equipment/Devices:  Discharge Condition: Stable CODE STATUS: Full code Diet recommendation: Renal diet  Brief/Interim Summary: 50 year old male with a history of chronic kidney disease stage V, hypertension, diabetes, admitted to the hospital with worsening shortness of breath and volume overload.  It was felt that he had progressed to end-stage renal disease and he was initiated on hemodialysis.  Discharge Diagnoses:  Principal Problem:   CHF (congestive heart failure) (HCC) Active Problems:   Hypertension   Acute kidney injury superimposed on CKD (HCC)   Hyperlipidemia   Elevated brain natriuretic peptide (BNP) level   Normocytic anemia   Hyperglycemia due to diabetes mellitus ([place])  1.  Acute on chronic diastolic CHF Admitted with significant volume overload. BNP elevated. Patient was started on IV Lasix and subsequently transitioned to torsemide. Echocardiogram done shows preserved EF without any wall motion abnormality or significant valvular abnormality. Volume status is being managed through dialysis  2.  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 Follows up with nephrology outpatient. Had AV fistula placed outpatient last year. Nephrology consulted, appreciate assistance. Patient was initiated on hemodialysis given his progression to  ESRD. Currently fistula is not functioning as expected and therefore patient had temporary HD catheter placement. Tunneled catheter placed by general surgery on 3/2 He has had several dialysis session with improvement of volume status Still has some degree of lower extremity edema He has been set up with [facility] [place] on a Monday, Wednesday, Friday schedule  3.  Anemia of chronic kidney disease with iron and B12 deficiency anemia Hemoglobin significantly low.  Currently stable. Patient denies any acute bleeding. The started on B12 injections. Iron also ordered with hemodialysis. Patient also receives EPO injections with nephrology.  Management per nephrology. Continue to follow hemoglobin as an outpatient  4.  HLD Continue Lipitor.  5.  Essential hypertension Blood pressure stable. We will continue to monitor.  6.  Type 2 diabetes mellitus, not on long-term insulin dependence, uncontrolled with hyperglycemia and renal complication Resume home regimen on discharge  Discharge Instructions  Discharge Instructions    Diet - low sodium heart healthy   Complete by: As directed    Discharge wound care:   Complete by: As directed    Keep dialysis catheter clean and dry   Increase activity slowly   Complete by: As directed      Allergies as of 06/29/2020      Reactions   Gabapentin Anaphylaxis   Lyrica [pregabalin] Anaphylaxis      Medication List    STOP taking these medications   furosemide 40 MG tablet Commonly known as: LASIX   hydrALAZINE 25 MG tablet Commonly known as: APRESOLINE   labetalol 100 MG tablet Commonly known as: NORMODYNE   lovastatin 20 MG tablet Commonly known as: MEVACOR     TAKE these medications   allopurinol 100 MG tablet Commonly known as: ZYLOPRIM Take 100 mg by  mouth daily.   aspirin EC 81 MG tablet Take 81 mg by mouth daily.   atorvastatin 40 MG tablet Commonly known as: LIPITOR Take 40 mg by mouth  daily.   calcitRIOL 0.25 MCG capsule Commonly known as: ROCALTROL Take 0.25 mcg by mouth daily.   calcium acetate 667 MG capsule Commonly known as: PHOSLO Take 667 mg by mouth 3 (three) times daily.   chlorpheniramine 4 MG tablet Commonly known as: CHLOR-TRIMETON Take 4 mg by mouth every 6 (six) hours as needed.   cyanocobalamin 1000 MCG tablet Take 1 tablet (1,000 mcg total) by mouth daily. Start taking on: June 30, 2020   diclofenac Sodium 1 % Gel Commonly known as: VOLTAREN Apply 1 application topically 4 (four) times daily.   folic acid 1 MG tablet Commonly known as: FOLVITE Take 1 tablet (1 mg total) by mouth daily.   glipiZIDE 10 MG tablet Commonly known as: GLUCOTROL Take 1 tablet (10 mg total) by mouth 2 (two) times daily before a meal.   oxyCODONE-acetaminophen 5-325 MG tablet Commonly known as: Percocet Take 1 tablet by mouth every 6 (six) hours as needed for severe pain.   Retacrit 3000 UNIT/ML injection Generic drug: epoetin alfa-epbx 3,000 Units every 14 (fourteen) days.   torsemide 100 MG tablet Commonly known as: DEMADEX Take 1 tablet (100 mg total) by mouth every morning. Start taking on: March 8, 123456   Trulicity [id number] Sopn Generic drug: Dulaglutide Inject 0.75 mg into the skin every 7 (seven) days.   Vitamin D 50 MCG (2000 UT) Caps Take 2,000 Units by mouth daily.            Discharge Care Instructions  (From admission, onward)         Start     Ordered   06/29/20 0000  Discharge wound care:       Comments: Keep dialysis catheter clean and dry   06/29/20 1452          Follow-up Information    [facility] Follow up on 07/01/2020.   Why: 11:45AM. Arrive at 11:30. Take Medicaid card to first treatment.  Contact information: [place] [address] [phone]              Allergies  Allergen Reactions  . Gabapentin Anaphylaxis  . Lyrica [Pregabalin] Anaphylaxis     Consultations:  Nephrology   Procedures/Studies: DG Chest Port 1 View  Result Date: 06/24/2020 CLINICAL DATA:  Dialysis catheter insertion. EXAM: PORTABLE CHEST 1 VIEW COMPARISON:  06/22/2020. FINDINGS: Right IJ dialysis catheter tip is at the SVC RA junction. No pneumothorax. Heart is enlarged, stable. Thoracic aorta is calcified. Small bilateral pleural effusions with bibasilar atelectasis, similar. IMPRESSION: 1. Right IJ dialysis catheter insertion without pneumothorax. 2. Small bilateral pleural effusions with bibasilar atelectasis. Electronically Signed   By: Lorin Picket M.D.   On: 06/24/2020 14:16   DG Chest Port 1 View  Result Date: 06/22/2020 CLINICAL DATA:  Dyspnea on exertion, lower extremity edema for 2 weeks EXAM: PORTABLE CHEST 1 VIEW COMPARISON:  None. FINDINGS: Single frontal view of the chest demonstrates an enlarged cardiac silhouette. There is central vascular congestion, with small bilateral pleural effusions. No pneumothorax. IMPRESSION: 1. Volume overload, with central vascular congestion and small bilateral pleural effusions. Electronically Signed   By: Randa Ngo M.D.   On: 06/22/2020 21:15   DG C-Arm 1-60 Min-No Report  Result Date: 06/24/2020 Fluoroscopy was utilized by the requesting physician.  No  radiographic interpretation.   ECHOCARDIOGRAM COMPLETE  Result Date: 06/23/2020    ECHOCARDIOGRAM REPORT   Patient Name:   Kevin Meyer Date of Exam: 06/23/2020 Medical Rec #:  [id number]   Height:       66.0 in Accession #:    [id number]  Weight:       291.0 lb Date of Birth:  1970/09/21    BSA:          2.345 m Patient Age:    48 years    BP:           117/87 mmHg Patient Gender: M           HR:           98 bpm. Exam Location:  [facility] Procedure: 2D Echo Indications:    CHF-Acute Systolic [id number]  History:        Patient has no prior history of Echocardiogram examinations.                 CHF; Risk Factors:Non-Smoker, Hypertension, Diabetes and                  Dyslipidemia. Acute Kidney Injury.  Sonographer:    Leavy Cella RDCS (AE) Referring Phys: [id number] OLADAPO ADEFESO IMPRESSIONS  1. Left ventricular ejection fraction, by estimation, is 50%. The left ventricle has low normal function. The left ventricle has no regional wall motion abnormalities. There is mild left ventricular hypertrophy. Left ventricular diastolic parameters are  indeterminate.  2. Right ventricular systolic function is normal. The right ventricular size is normal.  3. Left atrial size was mildly dilated.  4. A small pericardial effusion is present. The pericardial effusion is circumferential.  5. The mitral valve is normal in structure. No evidence of mitral valve regurgitation. No evidence of mitral stenosis.  6. The aortic valve is tricuspid. There is mild calcification of the aortic valve. There is mild thickening of the aortic valve. Aortic valve regurgitation is not visualized. No aortic stenosis is present.  7. The inferior vena cava is normal in size with greater than 50% respiratory variability, suggesting right atrial pressure of 3 mmHg. FINDINGS  Left Ventricle: Left ventricular ejection fraction, by estimation, is 50%. The left ventricle has low normal function. The left ventricle has no regional wall motion abnormalities. Definity contrast agent was given IV to delineate the left ventricular endocardial borders. The left ventricular internal cavity size was normal in size. There is mild left ventricular hypertrophy. Left ventricular diastolic parameters are indeterminate. Right Ventricle: The right ventricular size is normal. No increase in right ventricular wall thickness. Right ventricular systolic function is normal. Left Atrium: Left atrial size was mildly dilated. Right Atrium: Right atrial size was normal in size. Pericardium: A small pericardial effusion is present. The pericardial effusion is circumferential. Mitral Valve: The mitral valve is normal in structure. No evidence  of mitral valve regurgitation. No evidence of mitral valve stenosis. Tricuspid Valve: The tricuspid valve is normal in structure. Tricuspid valve regurgitation is not demonstrated. No evidence of tricuspid stenosis. Aortic Valve: The aortic valve is tricuspid. There is mild calcification of the aortic valve. There is mild thickening of the aortic valve. There is mild aortic valve annular calcification. Aortic valve regurgitation is not visualized. No aortic stenosis  is present. Aortic valve mean gradient measures 2.1 mmHg. Aortic valve peak gradient measures 3.9 mmHg. Aortic valve area, by VTI measures 2.79 cm. Pulmonic Valve: The pulmonic valve was not well visualized. Pulmonic valve regurgitation  is not visualized. No evidence of pulmonic stenosis. Aorta: The aortic root is normal in size and structure. Venous: The inferior vena cava is normal in size with greater than 50% respiratory variability, suggesting right atrial pressure of 3 mmHg. IAS/Shunts: No atrial level shunt detected by color flow Doppler.  LEFT VENTRICLE PLAX 2D LVIDd:         5.79 cm  Diastology LVIDs:         4.59 cm  LV e' medial:    6.64 cm/s LV PW:         1.35 cm  LV E/e' medial:  18.2 LV IVS:        1.03 cm  LV e' lateral:   9.90 cm/s LVOT diam:     1.80 cm  LV E/e' lateral: 12.2 LV SV:         52 LV SV Index:   22 LVOT Area:     2.54 cm  RIGHT VENTRICLE TAPSE (M-mode): 2.5 cm LEFT ATRIUM             Index       RIGHT ATRIUM           Index LA diam:        4.70 cm 2.00 cm/m  RA Area:     19.00 cm LA Vol (A2C):   82.5 ml 35.18 ml/m RA Volume:   58.40 ml  24.90 ml/m LA Vol (A4C):   56.0 ml 23.88 ml/m LA Biplane Vol: 68.6 ml 29.25 ml/m  AORTIC VALVE AV Area (Vmax):    2.06 cm AV Area (Vmean):   1.80 cm AV Area (VTI):     2.79 cm AV Vmax:           98.51 cm/s AV Vmean:          69.039 cm/s AV VTI:            0.185 m AV Peak Grad:      3.9 mmHg AV Mean Grad:      2.1 mmHg LVOT Vmax:         79.86 cm/s LVOT Vmean:        48.748 cm/s  LVOT VTI:          0.203 m LVOT/AV VTI ratio: 1.10  AORTA Ao Root diam: 3.10 cm MITRAL VALVE MV Area (PHT): 2.96 cm     SHUNTS MV Decel Time: 256 msec     Systemic VTI:  0.20 m MV E velocity: 121.00 cm/s  Systemic Diam: 1.80 cm Carlyle Dolly MD Electronically signed by Carlyle Dolly MD Signature Date/Time: 06/23/2020/12:02:37 PM    Final        Subjective: Shortness of breath has improved.  Able to ambulate without shortness of breath.  Discharge Exam: Vitals:   06/29/20 1330 06/29/20 1400 06/29/20 1430 06/29/20 1440  BP: 140/77 131/72 127/80 134/73  Pulse: 89 89 90 100  Resp:    16  Temp:    98.8 F (37.1 C)  TempSrc:    Oral  SpO2:    97%  Weight:    117.7 kg  Height:        General: Pt is alert, awake, not in acute distress Cardiovascular: RRR, S1/S2 +, no rubs, no gallops Respiratory: CTA bilaterally, no wheezing, no rhonchi Abdominal: Soft, NT, ND, bowel sounds + Extremities: 2+ dyspnea in the lower extremities bilaterally with some venous stasis changes    The results of significant diagnostics from this hospitalization (including imaging, microbiology, ancillary and  laboratory) are listed below for reference.     Microbiology: Recent Results (from the past 240 hour(s))  Resp Panel by RT-PCR (Flu A&B, Covid) Nasopharyngeal Swab     Status: None   Collection Time: 06/22/20 10:55 PM   Specimen: Nasopharyngeal Swab; Nasopharyngeal(NP) swabs in vial transport medium  Result Value Ref Range Status   SARS Coronavirus 2 by RT PCR NEGATIVE NEGATIVE Final    Comment: (NOTE) SARS-CoV-2 target nucleic acids are NOT DETECTED.  The SARS-CoV-2 RNA is generally detectable in upper respiratory specimens during the acute phase of infection. The lowest concentration of SARS-CoV-2 viral copies this assay can detect is 138 copies/mL. A negative result does not preclude SARS-Cov-2 infection and should not be used as the sole basis for treatment or other patient management  decisions. A negative result may occur with  improper specimen collection/handling, submission of specimen other than nasopharyngeal swab, presence of viral mutation(s) within the areas targeted by this assay, and inadequate number of viral copies(<138 copies/mL). A negative result must be combined with clinical observations, patient history, and epidemiological information. The expected result is Negative.  Fact Sheet for Patients:  EntrepreneurPulse.com.au  Fact Sheet for Healthcare Providers:  IncredibleEmployment.be  This test is no t yet approved or cleared by the [place] FDA and  has been authorized for detection and/or diagnosis of SARS-CoV-2 by FDA under an Emergency Use Authorization (EUA). This EUA will remain  in effect (meaning this test can be used) for the duration of the COVID-19 declaration under Section 564(b)(1) of the Act, 21 U.S.C.section 360bbb-3(b)(1), unless the authorization is terminated  or revoked sooner.       Influenza A by PCR NEGATIVE NEGATIVE Final   Influenza B by PCR NEGATIVE NEGATIVE Final    Comment: (NOTE) The Xpert Xpress SARS-CoV-2/FLU/RSV plus assay is intended as an aid in the diagnosis of influenza from Nasopharyngeal swab specimens and should not be used as a sole basis for treatment. Nasal washings and aspirates are unacceptable for Xpert Xpress SARS-CoV-2/FLU/RSV testing.  Fact Sheet for Patients: EntrepreneurPulse.com.au  Fact Sheet for Healthcare Providers: IncredibleEmployment.be  This test is not yet approved or cleared by the [place] FDA and has been authorized for detection and/or diagnosis of SARS-CoV-2 by FDA under an Emergency Use Authorization (EUA). This EUA will remain in effect (meaning this test can be used) for the duration of the COVID-19 declaration under Section 564(b)(1) of the Act, 21 U.S.C. section 360bbb-3(b)(1), unless the  authorization is terminated or revoked.  Performed at [facility], [address] [place], [place] [address]      Labs: BNP (last 3 results) Recent Labs    06/22/20 2057  BNP [id number]*   Basic Metabolic Panel: Recent Labs  Lab 06/23/20 0451 06/25/20 0511 06/26/20 0506 06/27/20 1551 06/29/20 0533  NA 138 137 137 135 138  K 4.6 5.3* 3.8 3.7 4.0  CL 108 103 101 100 101  CO2 15* 16* 22 21* 26  GLUCOSE 135* 162* 144* 187* 143*  BUN 99* 92* 81* 89* 66*  CREATININE 6.62* 6.35* 4.89* 5.58* 4.58*  CALCIUM 7.6* 8.4* 8.3* 8.0* 8.9  MG 1.7  --   --   --   --   PHOS 9.2* 9.7* 5.8* 4.8* 4.1   Liver Function Tests: Recent Labs  Lab 06/23/20 0451 06/25/20 0511 06/26/20 0506 06/27/20 1551 06/29/20 0533  AST 13*  --   --   --   --   ALT 15  --   --   --   --  ALKPHOS 49  --   --   --   --   BILITOT 0.5  --   --   --   --   PROT 6.6  --   --   --   --   ALBUMIN 3.4* 3.3* 3.3* 3.2* 3.3*   No results for input(s): LIPASE, AMYLASE in the last 168 hours. No results for input(s): AMMONIA in the last 168 hours. CBC: Recent Labs  Lab 06/23/20 0451 06/25/20 0511 06/26/20 0506 06/27/20 1551 06/29/20 0533  WBC 6.4 4.9 6.3 6.4 7.2  HGB 7.2* 7.3* 7.3* 7.2* 7.5*  HCT 24.8* 24.8* 24.5* 23.4* 26.3*  MCV 92.5 93.2 90.7 92.1 94.6  PLT 165 144* 142* 149* 135*   Cardiac Enzymes: No results for input(s): CKTOTAL, CKMB, CKMBINDEX, TROPONINI in the last 168 hours. BNP: Invalid input(s): POCBNP CBG: Recent Labs  Lab 06/28/20 1134 06/28/20 1631 06/28/20 2139 06/29/20 0720 06/29/20 1110  GLUCAP 148* 193* 154* 121* 141*   D-Dimer No results for input(s): DDIMER in the last 72 hours. Hgb A1c No results for input(s): HGBA1C in the last 72 hours. Lipid Profile No results for input(s): CHOL, HDL, LDLCALC, TRIG, CHOLHDL, LDLDIRECT in the last 72 hours. Thyroid function studies No results for input(s): TSH, T4TOTAL, T3FREE, THYROIDAB in the last 72 hours.  Invalid input(s):  FREET3 Anemia work up No results for input(s): VITAMINB12, FOLATE, FERRITIN, TIBC, IRON, RETICCTPCT in the last 72 hours. Urinalysis    Component Value Date/Time   COLORURINE YELLOW 06/22/2020 2127   APPEARANCEUR CLEAR 06/22/2020 2127   LABSPEC 1.012 06/22/2020 2127   PHURINE 5.0 06/22/2020 2127   GLUCOSEU 50 (A) 06/22/2020 2127   HGBUR NEGATIVE 06/22/2020 2127   BILIRUBINUR NEGATIVE 06/22/2020 2127   KETONESUR NEGATIVE 06/22/2020 2127   PROTEINUR 100 (A) 06/22/2020 2127   NITRITE NEGATIVE 06/22/2020 2127   LEUKOCYTESUR NEGATIVE 06/22/2020 2127   Sepsis Labs Invalid input(s): PROCALCITONIN,  WBC,  LACTICIDVEN Microbiology Recent Results (from the past 240 hour(s))  Resp Panel by RT-PCR (Flu A&B, Covid) Nasopharyngeal Swab     Status: None   Collection Time: 06/22/20 10:55 PM   Specimen: Nasopharyngeal Swab; Nasopharyngeal(NP) swabs in vial transport medium  Result Value Ref Range Status   SARS Coronavirus 2 by RT PCR NEGATIVE NEGATIVE Final    Comment: (NOTE) SARS-CoV-2 target nucleic acids are NOT DETECTED.  The SARS-CoV-2 RNA is generally detectable in upper respiratory specimens during the acute phase of infection. The lowest concentration of SARS-CoV-2 viral copies this assay can detect is 138 copies/mL. A negative result does not preclude SARS-Cov-2 infection and should not be used as the sole basis for treatment or other patient management decisions. A negative result may occur with  improper specimen collection/handling, submission of specimen other than nasopharyngeal swab, presence of viral mutation(s) within the areas targeted by this assay, and inadequate number of viral copies(<138 copies/mL). A negative result must be combined with clinical observations, patient history, and epidemiological information. The expected result is Negative.  Fact Sheet for Patients:  EntrepreneurPulse.com.au  Fact Sheet for Healthcare Providers:   IncredibleEmployment.be  This test is no t yet approved or cleared by the [place] FDA and  has been authorized for detection and/or diagnosis of SARS-CoV-2 by FDA under an Emergency Use Authorization (EUA). This EUA will remain  in effect (meaning this test can be used) for the duration of the COVID-19 declaration under Section 564(b)(1) of the Act, 21 U.S.C.section 360bbb-3(b)(1), unless the authorization is terminated  or  revoked sooner.       Influenza A by PCR NEGATIVE NEGATIVE Final   Influenza B by PCR NEGATIVE NEGATIVE Final    Comment: (NOTE) The Xpert Xpress SARS-CoV-2/FLU/RSV plus assay is intended as an aid in the diagnosis of influenza from Nasopharyngeal swab specimens and should not be used as a sole basis for treatment. Nasal washings and aspirates are unacceptable for Xpert Xpress SARS-CoV-2/FLU/RSV testing.  Fact Sheet for Patients: EntrepreneurPulse.com.au  Fact Sheet for Healthcare Providers: IncredibleEmployment.be  This test is not yet approved or cleared by the [place] FDA and has been authorized for detection and/or diagnosis of SARS-CoV-2 by FDA under an Emergency Use Authorization (EUA). This EUA will remain in effect (meaning this test can be used) for the duration of the COVID-19 declaration under Section 564(b)(1) of the Act, 21 U.S.C. section 360bbb-3(b)(1), unless the authorization is terminated or revoked.  Performed at [facility], [address] [place], [place] [address]      Time coordinating discharge: 18mns  SIGNED:   JKathie Dike MD  [facility] 06/29/2020, 8:59 PM   If 7PM-7AM, please contact night-coverage [email or web address]

## 2020-06-29 NOTE — TOC Transition Note (Signed)
Transition of Care [facility]) - CM/SW Discharge Note   Patient Details  Name: Kevin Meyer MRN: [id number] Date of Birth: [birth year]  [facility]) CM/SW Contact:  Salome Arnt, LCSW Phone Number: 06/29/2020, 3:02 PM   Clinical Narrative:  Kieth Brightly at [facility] confirmed dialysis chair time MWF at 11:45. Pt notified and aware to arrive at 11:30. Pt states he has transportation. No other needs reported. D/C today.      Final next level of care: Home/Self Care Barriers to Discharge: Barriers Resolved   Patient Goals and CMS Choice        Discharge Placement                       Discharge Plan and Services                DME Arranged: N/A DME Agency: NA                  Social Determinants of Health ([place]) Interventions     Readmission Risk Interventions Readmission Risk Prevention Plan 06/25/2020  Transportation Screening Complete  HRI or [place] Complete  Social Work Consult for [place] Planning/Counseling Complete  Some recent data might be hidden

## 2020-06-29 NOTE — Procedures (Signed)
HEMODIALYSIS TREATMENT NOTE (HD#4):  3.5 hour heparin-free HD completed via [facility]IJ TDC. Exit site is unremarkable. Goal met: 4 liters removed without interruption in UF.  All blood was returned.   Outpatient arrangements are confirmed.  Pt is ready to discharge per Dr. [place].  Angela Poteat, RN 

## 2020-06-29 NOTE — TOC Progression Note (Signed)
Transition of Care [facility]) - Progression Note   Patient Details  Name: Hudie Picken MRN: [id number] Date of Birth: 1970-06-12  Transition of Care [facility]) CM/SW [place], LCSW Phone Number: 06/29/2020, 10:46 AM  Clinical Narrative: CSW received call from [place] with [facility]. Per Caryl Asp, all new dialysis referrals for [facility] are being diverted to [facility] at this time. Patient's Medicaid is not in-network with [facility], so the patient would have a copay. Joy reported TOC would likely need to refer patient to [facility] in [place]. TOC to continue working on scheduling a dialysis chair.  Readmission Risk Interventions Readmission Risk Prevention Plan 06/25/2020  Transportation Screening Complete  HRI or [place] Complete  Social Work Consult for [place] Planning/Counseling Complete  Some recent data might be hidden

## 2020-06-29 NOTE — TOC Progression Note (Signed)
Transition of Care [facility]) - Progression Note    Patient Details  Name: Kevin Meyer MRN: [id number] Date of Birth: Aug 30, 1970  [facility]) CM/SW Contact  Salome Arnt, [place] Phone Number: 06/29/2020, 11:01 AM  Clinical Narrative:  LCSW notified pt that [facility] is not accepting patients at this time and pt's insurance is out of network with [employer]. Referral faxed to [employer].            Expected Discharge Plan and Services                                                 Social Determinants of Health (SDOH) Interventions    Readmission Risk Interventions Readmission Risk Prevention Plan 06/25/2020  Transportation Screening Complete  HRI or [place] Complete  Social Work Consult for [place] Planning/Counseling Complete  Some recent data might be hidden

## 2020-06-29 NOTE — Discharge Summary (Signed)
Physician Discharge Summary  Kevin Meyer [id number] DOB: 16-May-1970 DOA: 06/22/2020  PCP: Royce Macadamia D., PA-C  Admit date: 06/22/2020 Discharge date: 06/29/2020  Admitted From: Home Disposition: Home  Recommendations for Outpatient Follow-up:  1. Follow up with PCP in 1-2 weeks 2. Please obtain BMP/CBC in one week 3. Patient has been established with [facility] in [place].  He will be on a Monday, Wednesday, Friday schedule 4. Permanent dialysis access will need to be addressed as an outpatient  Home Health: Equipment/Devices:  Discharge Condition: Stable CODE STATUS: Full code Diet recommendation: Renal diet  Brief/Interim Summary: 50 year old male with a history of chronic kidney disease stage V, hypertension, diabetes, admitted to the hospital with worsening shortness of breath and volume overload.  It was felt that he had progressed to end-stage renal disease and he was initiated on hemodialysis.  Discharge Diagnoses:  Principal Problem:   CHF (congestive heart failure) (HCC) Active Problems:   Hypertension   Acute kidney injury superimposed on CKD (HCC)   Hyperlipidemia   Elevated brain natriuretic peptide (BNP) level   Normocytic anemia   Hyperglycemia due to diabetes mellitus ([place])  1.  Acute on chronic diastolic CHF Admitted with significant volume overload. BNP elevated. Patient was started on IV Lasix and subsequently transitioned to torsemide. Echocardiogram done shows preserved EF without any wall motion abnormality or significant valvular abnormality. Volume status is being managed through dialysis  2.  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 Follows up with nephrology outpatient. Had AV fistula placed outpatient last year. Nephrology consulted, appreciate assistance. Patient was initiated on hemodialysis given his progression to  ESRD. Currently fistula is not functioning as expected and therefore patient had temporary HD catheter placement. Tunneled catheter placed by general surgery on 3/2 He has had several dialysis session with improvement of volume status Still has some degree of lower extremity edema He has been set up with [facility] [place] on a Monday, Wednesday, Friday schedule  3.  Anemia of chronic kidney disease with iron and B12 deficiency anemia Hemoglobin significantly low.  Currently stable. Patient denies any acute bleeding. The started on B12 injections. Iron also ordered with hemodialysis. Patient also receives EPO injections with nephrology.  Management per nephrology. Continue to follow hemoglobin as an outpatient  4.  HLD Continue Lipitor.  5.  Essential hypertension Blood pressure stable. We will continue to monitor.  6.  Type 2 diabetes mellitus, not on long-term insulin dependence, uncontrolled with hyperglycemia and renal complication Resume home regimen on discharge  Discharge Instructions  Discharge Instructions    Diet - low sodium heart healthy   Complete by: As directed    Discharge wound care:   Complete by: As directed    Keep dialysis catheter clean and dry   Increase activity slowly   Complete by: As directed      Allergies as of 06/29/2020      Reactions   Gabapentin Anaphylaxis   Lyrica [pregabalin] Anaphylaxis      Medication List    STOP taking these medications   furosemide 40 MG tablet Commonly known as: LASIX   hydrALAZINE 25 MG tablet Commonly known as: APRESOLINE   labetalol 100 MG tablet Commonly known as: NORMODYNE   lovastatin 20 MG tablet Commonly known as: MEVACOR     TAKE these medications   allopurinol 100 MG tablet Commonly known as: ZYLOPRIM Take 100 mg by  mouth daily.   aspirin EC 81 MG tablet Take 81 mg by mouth daily.   atorvastatin 40 MG tablet Commonly known as: LIPITOR Take 40 mg by mouth  daily.   calcitRIOL 0.25 MCG capsule Commonly known as: ROCALTROL Take 0.25 mcg by mouth daily.   calcium acetate 667 MG capsule Commonly known as: PHOSLO Take 667 mg by mouth 3 (three) times daily.   chlorpheniramine 4 MG tablet Commonly known as: CHLOR-TRIMETON Take 4 mg by mouth every 6 (six) hours as needed.   cyanocobalamin 1000 MCG tablet Take 1 tablet (1,000 mcg total) by mouth daily. Start taking on: June 30, 2020   diclofenac Sodium 1 % Gel Commonly known as: VOLTAREN Apply 1 application topically 4 (four) times daily.   folic acid 1 MG tablet Commonly known as: FOLVITE Take 1 tablet (1 mg total) by mouth daily.   glipiZIDE 10 MG tablet Commonly known as: GLUCOTROL Take 1 tablet (10 mg total) by mouth 2 (two) times daily before a meal.   oxyCODONE-acetaminophen 5-325 MG tablet Commonly known as: Percocet Take 1 tablet by mouth every 6 (six) hours as needed for severe pain.   Retacrit 3000 UNIT/ML injection Generic drug: epoetin alfa-epbx 3,000 Units every 14 (fourteen) days.   torsemide 100 MG tablet Commonly known as: DEMADEX Take 1 tablet (100 mg total) by mouth every morning. Start taking on: March 8, 123456   Trulicity [id number] Sopn Generic drug: Dulaglutide Inject 0.75 mg into the skin every 7 (seven) days.   Vitamin D 50 MCG (2000 UT) Caps Take 2,000 Units by mouth daily.            Discharge Care Instructions  (From admission, onward)         Start     Ordered   06/29/20 0000  Discharge wound care:       Comments: Keep dialysis catheter clean and dry   06/29/20 1452          Follow-up Information    [facility] Follow up on 07/01/2020.   Why: 11:45AM. Arrive at 11:30. Take Medicaid card to first treatment.  Contact information: [place] [address] [phone]              Allergies  Allergen Reactions  . Gabapentin Anaphylaxis  . Lyrica [Pregabalin] Anaphylaxis     Consultations:  Nephrology   Procedures/Studies: DG Chest Port 1 View  Result Date: 06/24/2020 CLINICAL DATA:  Dialysis catheter insertion. EXAM: PORTABLE CHEST 1 VIEW COMPARISON:  06/22/2020. FINDINGS: Right IJ dialysis catheter tip is at the SVC RA junction. No pneumothorax. Heart is enlarged, stable. Thoracic aorta is calcified. Small bilateral pleural effusions with bibasilar atelectasis, similar. IMPRESSION: 1. Right IJ dialysis catheter insertion without pneumothorax. 2. Small bilateral pleural effusions with bibasilar atelectasis. Electronically Signed   By: Lorin Picket M.D.   On: 06/24/2020 14:16   DG Chest Port 1 View  Result Date: 06/22/2020 CLINICAL DATA:  Dyspnea on exertion, lower extremity edema for 2 weeks EXAM: PORTABLE CHEST 1 VIEW COMPARISON:  None. FINDINGS: Single frontal view of the chest demonstrates an enlarged cardiac silhouette. There is central vascular congestion, with small bilateral pleural effusions. No pneumothorax. IMPRESSION: 1. Volume overload, with central vascular congestion and small bilateral pleural effusions. Electronically Signed   By: Randa Ngo M.D.   On: 06/22/2020 21:15   DG C-Arm 1-60 Min-No Report  Result Date: 06/24/2020 Fluoroscopy was utilized by the requesting physician.  No  radiographic interpretation.   ECHOCARDIOGRAM COMPLETE  Result Date: 06/23/2020    ECHOCARDIOGRAM REPORT   Patient Name:   Kevin Meyer Date of Exam: 06/23/2020 Medical Rec #:  [id number]   Height:       66.0 in Accession #:    [id number]  Weight:       291.0 lb Date of Birth:  10-30-1970    BSA:          2.345 m Patient Age:    50 years    BP:           117/87 mmHg Patient Gender: M           HR:           98 bpm. Exam Location:  [facility] Procedure: 2D Echo Indications:    CHF-Acute Systolic [id number]  History:        Patient has no prior history of Echocardiogram examinations.                 CHF; Risk Factors:Non-Smoker, Hypertension, Diabetes and                  Dyslipidemia. Acute Kidney Injury.  Sonographer:    Leavy Cella RDCS (AE) Referring Phys: [id number] OLADAPO ADEFESO IMPRESSIONS  1. Left ventricular ejection fraction, by estimation, is 50%. The left ventricle has low normal function. The left ventricle has no regional wall motion abnormalities. There is mild left ventricular hypertrophy. Left ventricular diastolic parameters are  indeterminate.  2. Right ventricular systolic function is normal. The right ventricular size is normal.  3. Left atrial size was mildly dilated.  4. A small pericardial effusion is present. The pericardial effusion is circumferential.  5. The mitral valve is normal in structure. No evidence of mitral valve regurgitation. No evidence of mitral stenosis.  6. The aortic valve is tricuspid. There is mild calcification of the aortic valve. There is mild thickening of the aortic valve. Aortic valve regurgitation is not visualized. No aortic stenosis is present.  7. The inferior vena cava is normal in size with greater than 50% respiratory variability, suggesting right atrial pressure of 3 mmHg. FINDINGS  Left Ventricle: Left ventricular ejection fraction, by estimation, is 50%. The left ventricle has low normal function. The left ventricle has no regional wall motion abnormalities. Definity contrast agent was given IV to delineate the left ventricular endocardial borders. The left ventricular internal cavity size was normal in size. There is mild left ventricular hypertrophy. Left ventricular diastolic parameters are indeterminate. Right Ventricle: The right ventricular size is normal. No increase in right ventricular wall thickness. Right ventricular systolic function is normal. Left Atrium: Left atrial size was mildly dilated. Right Atrium: Right atrial size was normal in size. Pericardium: A small pericardial effusion is present. The pericardial effusion is circumferential. Mitral Valve: The mitral valve is normal in structure. No evidence  of mitral valve regurgitation. No evidence of mitral valve stenosis. Tricuspid Valve: The tricuspid valve is normal in structure. Tricuspid valve regurgitation is not demonstrated. No evidence of tricuspid stenosis. Aortic Valve: The aortic valve is tricuspid. There is mild calcification of the aortic valve. There is mild thickening of the aortic valve. There is mild aortic valve annular calcification. Aortic valve regurgitation is not visualized. No aortic stenosis  is present. Aortic valve mean gradient measures 2.1 mmHg. Aortic valve peak gradient measures 3.9 mmHg. Aortic valve area, by VTI measures 2.79 cm. Pulmonic Valve: The pulmonic valve was not well visualized. Pulmonic valve regurgitation  is not visualized. No evidence of pulmonic stenosis. Aorta: The aortic root is normal in size and structure. Venous: The inferior vena cava is normal in size with greater than 50% respiratory variability, suggesting right atrial pressure of 3 mmHg. IAS/Shunts: No atrial level shunt detected by color flow Doppler.  LEFT VENTRICLE PLAX 2D LVIDd:         5.79 cm  Diastology LVIDs:         4.59 cm  LV e' medial:    6.64 cm/s LV PW:         1.35 cm  LV E/e' medial:  18.2 LV IVS:        1.03 cm  LV e' lateral:   9.90 cm/s LVOT diam:     1.80 cm  LV E/e' lateral: 12.2 LV SV:         52 LV SV Index:   22 LVOT Area:     2.54 cm  RIGHT VENTRICLE TAPSE (M-mode): 2.5 cm LEFT ATRIUM             Index       RIGHT ATRIUM           Index LA diam:        4.70 cm 2.00 cm/m  RA Area:     19.00 cm LA Vol (A2C):   82.5 ml 35.18 ml/m RA Volume:   58.40 ml  24.90 ml/m LA Vol (A4C):   56.0 ml 23.88 ml/m LA Biplane Vol: 68.6 ml 29.25 ml/m  AORTIC VALVE AV Area (Vmax):    2.06 cm AV Area (Vmean):   1.80 cm AV Area (VTI):     2.79 cm AV Vmax:           98.51 cm/s AV Vmean:          69.039 cm/s AV VTI:            0.185 m AV Peak Grad:      3.9 mmHg AV Mean Grad:      2.1 mmHg LVOT Vmax:         79.86 cm/s LVOT Vmean:        48.748 cm/s  LVOT VTI:          0.203 m LVOT/AV VTI ratio: 1.10  AORTA Ao Root diam: 3.10 cm MITRAL VALVE MV Area (PHT): 2.96 cm     SHUNTS MV Decel Time: 256 msec     Systemic VTI:  0.20 m MV E velocity: 121.00 cm/s  Systemic Diam: 1.80 cm Carlyle Dolly MD Electronically signed by Carlyle Dolly MD Signature Date/Time: 06/23/2020/12:02:37 PM    Final       Subjective: Shortness of breath has improved.  Able to ambulate without shortness of breath.  Discharge Exam: Vitals:   06/29/20 1330 06/29/20 1400 06/29/20 1430 06/29/20 1440  BP: 140/77 131/72 127/80 134/73  Pulse: 89 89 90 100  Resp:    16  Temp:    98.8 F (37.1 C)  TempSrc:    Oral  SpO2:    97%  Weight:    117.7 kg  Height:        General: Pt is alert, awake, not in acute distress Cardiovascular: RRR, S1/S2 +, no rubs, no gallops Respiratory: CTA bilaterally, no wheezing, no rhonchi Abdominal: Soft, NT, ND, bowel sounds + Extremities: 2+ dyspnea in the lower extremities bilaterally with some venous stasis changes    The results of significant diagnostics from this hospitalization (including imaging, microbiology, ancillary and laboratory)  are listed below for reference.     Microbiology: Recent Results (from the past 240 hour(s))  Resp Panel by RT-PCR (Flu A&B, Covid) Nasopharyngeal Swab     Status: None   Collection Time: 06/22/20 10:55 PM   Specimen: Nasopharyngeal Swab; Nasopharyngeal(NP) swabs in vial transport medium  Result Value Ref Range Status   SARS Coronavirus 2 by RT PCR NEGATIVE NEGATIVE Final    Comment: (NOTE) SARS-CoV-2 target nucleic acids are NOT DETECTED.  The SARS-CoV-2 RNA is generally detectable in upper respiratory specimens during the acute phase of infection. The lowest concentration of SARS-CoV-2 viral copies this assay can detect is 138 copies/mL. A negative result does not preclude SARS-Cov-2 infection and should not be used as the sole basis for treatment or other patient management  decisions. A negative result may occur with  improper specimen collection/handling, submission of specimen other than nasopharyngeal swab, presence of viral mutation(s) within the areas targeted by this assay, and inadequate number of viral copies(<138 copies/mL). A negative result must be combined with clinical observations, patient history, and epidemiological information. The expected result is Negative.  Fact Sheet for Patients:  EntrepreneurPulse.com.au  Fact Sheet for Healthcare Providers:  IncredibleEmployment.be  This test is no t yet approved or cleared by the [place] FDA and  has been authorized for detection and/or diagnosis of SARS-CoV-2 by FDA under an Emergency Use Authorization (EUA). This EUA will remain  in effect (meaning this test can be used) for the duration of the COVID-19 declaration under Section 564(b)(1) of the Act, 21 U.S.C.section 360bbb-3(b)(1), unless the authorization is terminated  or revoked sooner.       Influenza A by PCR NEGATIVE NEGATIVE Final   Influenza B by PCR NEGATIVE NEGATIVE Final    Comment: (NOTE) The Xpert Xpress SARS-CoV-2/FLU/RSV plus assay is intended as an aid in the diagnosis of influenza from Nasopharyngeal swab specimens and should not be used as a sole basis for treatment. Nasal washings and aspirates are unacceptable for Xpert Xpress SARS-CoV-2/FLU/RSV testing.  Fact Sheet for Patients: EntrepreneurPulse.com.au  Fact Sheet for Healthcare Providers: IncredibleEmployment.be  This test is not yet approved or cleared by the [place] FDA and has been authorized for detection and/or diagnosis of SARS-CoV-2 by FDA under an Emergency Use Authorization (EUA). This EUA will remain in effect (meaning this test can be used) for the duration of the COVID-19 declaration under Section 564(b)(1) of the Act, 21 U.S.C. section 360bbb-3(b)(1), unless the  authorization is terminated or revoked.  Performed at [facility], [address], [place], [place] [address]      Labs: BNP (last 3 results) Recent Labs    06/22/20 2057  BNP [id number]*   Basic Metabolic Panel: Recent Labs  Lab 06/23/20 0451 06/25/20 0511 06/26/20 0506 06/27/20 1551 06/29/20 0533  NA 138 137 137 135 138  K 4.6 5.3* 3.8 3.7 4.0  CL 108 103 101 100 101  CO2 15* 16* 22 21* 26  GLUCOSE 135* 162* 144* 187* 143*  BUN 99* 92* 81* 89* 66*  CREATININE 6.62* 6.35* 4.89* 5.58* 4.58*  CALCIUM 7.6* 8.4* 8.3* 8.0* 8.9  MG 1.7  --   --   --   --   PHOS 9.2* 9.7* 5.8* 4.8* 4.1   Liver Function Tests: Recent Labs  Lab 06/23/20 0451 06/25/20 0511 06/26/20 0506 06/27/20 1551 06/29/20 0533  AST 13*  --   --   --   --   ALT 15  --   --   --   --  ALKPHOS 49  --   --   --   --   BILITOT 0.5  --   --   --   --   PROT 6.6  --   --   --   --   ALBUMIN 3.4* 3.3* 3.3* 3.2* 3.3*   No results for input(s): LIPASE, AMYLASE in the last 168 hours. No results for input(s): AMMONIA in the last 168 hours. CBC: Recent Labs  Lab 06/23/20 0451 06/25/20 0511 06/26/20 0506 06/27/20 1551 06/29/20 0533  WBC 6.4 4.9 6.3 6.4 7.2  HGB 7.2* 7.3* 7.3* 7.2* 7.5*  HCT 24.8* 24.8* 24.5* 23.4* 26.3*  MCV 92.5 93.2 90.7 92.1 94.6  PLT 165 144* 142* 149* 135*   Cardiac Enzymes: No results for input(s): CKTOTAL, CKMB, CKMBINDEX, TROPONINI in the last 168 hours. BNP: Invalid input(s): POCBNP CBG: Recent Labs  Lab 06/28/20 1134 06/28/20 1631 06/28/20 2139 06/29/20 0720 06/29/20 1110  GLUCAP 148* 193* 154* 121* 141*   D-Dimer No results for input(s): DDIMER in the last 72 hours. Hgb A1c No results for input(s): HGBA1C in the last 72 hours. Lipid Profile No results for input(s): CHOL, HDL, LDLCALC, TRIG, CHOLHDL, LDLDIRECT in the last 72 hours. Thyroid function studies No results for input(s): TSH, T4TOTAL, T3FREE, THYROIDAB in the last 72 hours.  Invalid input(s):  FREET3 Anemia work up No results for input(s): VITAMINB12, FOLATE, FERRITIN, TIBC, IRON, RETICCTPCT in the last 72 hours. Urinalysis    Component Value Date/Time   COLORURINE YELLOW 06/22/2020 2127   APPEARANCEUR CLEAR 06/22/2020 2127   LABSPEC 1.012 06/22/2020 2127   PHURINE 5.0 06/22/2020 2127   GLUCOSEU 50 (A) 06/22/2020 2127   HGBUR NEGATIVE 06/22/2020 2127   BILIRUBINUR NEGATIVE 06/22/2020 2127   KETONESUR NEGATIVE 06/22/2020 2127   PROTEINUR 100 (A) 06/22/2020 2127   NITRITE NEGATIVE 06/22/2020 2127   LEUKOCYTESUR NEGATIVE 06/22/2020 2127   Sepsis Labs Invalid input(s): PROCALCITONIN,  WBC,  LACTICIDVEN Microbiology Recent Results (from the past 240 hour(s))  Resp Panel by RT-PCR (Flu A&B, Covid) Nasopharyngeal Swab     Status: None   Collection Time: 06/22/20 10:55 PM   Specimen: Nasopharyngeal Swab; Nasopharyngeal(NP) swabs in vial transport medium  Result Value Ref Range Status   SARS Coronavirus 2 by RT PCR NEGATIVE NEGATIVE Final    Comment: (NOTE) SARS-CoV-2 target nucleic acids are NOT DETECTED.  The SARS-CoV-2 RNA is generally detectable in upper respiratory specimens during the acute phase of infection. The lowest concentration of SARS-CoV-2 viral copies this assay can detect is 138 copies/mL. A negative result does not preclude SARS-Cov-2 infection and should not be used as the sole basis for treatment or other patient management decisions. A negative result may occur with  improper specimen collection/handling, submission of specimen other than nasopharyngeal swab, presence of viral mutation(s) within the areas targeted by this assay, and inadequate number of viral copies(<138 copies/mL). A negative result must be combined with clinical observations, patient history, and epidemiological information. The expected result is Negative.  Fact Sheet for Patients:  EntrepreneurPulse.com.au  Fact Sheet for Healthcare Providers:   IncredibleEmployment.be  This test is no t yet approved or cleared by the [place] FDA and  has been authorized for detection and/or diagnosis of SARS-CoV-2 by FDA under an Emergency Use Authorization (EUA). This EUA will remain  in effect (meaning this test can be used) for the duration of the COVID-19 declaration under Section 564(b)(1) of the Act, 21 U.S.C.section 360bbb-3(b)(1), unless the authorization is terminated  or  revoked sooner.       Influenza A by PCR NEGATIVE NEGATIVE Final   Influenza B by PCR NEGATIVE NEGATIVE Final    Comment: (NOTE) The Xpert Xpress SARS-CoV-2/FLU/RSV plus assay is intended as an aid in the diagnosis of influenza from Nasopharyngeal swab specimens and should not be used as a sole basis for treatment. Nasal washings and aspirates are unacceptable for Xpert Xpress SARS-CoV-2/FLU/RSV testing.  Fact Sheet for Patients: EntrepreneurPulse.com.au  Fact Sheet for Healthcare Providers: IncredibleEmployment.be  This test is not yet approved or cleared by the [place] FDA and has been authorized for detection and/or diagnosis of SARS-CoV-2 by FDA under an Emergency Use Authorization (EUA). This EUA will remain in effect (meaning this test can be used) for the duration of the COVID-19 declaration under Section 564(b)(1) of the Act, 21 U.S.C. section 360bbb-3(b)(1), unless the authorization is terminated or revoked.  Performed at [facility], [address], [place], [place] [address]      Time coordinating discharge: 61mns  SIGNED:   JKathie Dike MD  [facility] 06/29/2020, 9:12 PM   If 7PM-7AM, please contact night-coverage [email or web address]

## 2020-06-29 NOTE — Progress Notes (Signed)
Admit: 06/22/2020 LOS: 7  32M CKD 5 presenting with massive volume overload, anemia, metabolic acidosis.  New ESRD  Subjective:  Marland Kitchen Made 1000 of urine last 24 hours . No complaints this morning, feels improved, UE swelling improving- still with significant LE swelling  . CLIP in process-  4th HD planned for today   03/06 0701 - 03/07 0700 In: 720 [P.O.:720] Out: 1000 [Urine:1000]  Filed Weights   06/27/20 1820 06/28/20 0500 06/29/20 0609  Weight: 122.6 kg 122.5 kg 121.4 kg    Scheduled Meds: . aspirin EC  81 mg Oral Daily  . atorvastatin  40 mg Oral Daily  . calcium acetate  1,334 mg Oral TID with meals  . Chlorhexidine Gluconate Cloth  6 each Topical Daily  . Chlorhexidine Gluconate Cloth  6 each Topical Q0600  . darbepoetin (ARANESP) injection - DIALYSIS  100 mcg Intravenous Q Wed-HD  . heparin  5,000 Units Subcutaneous Q8H  . insulin aspart  0-5 Units Subcutaneous QHS  . insulin aspart  0-9 Units Subcutaneous TID WC  . torsemide  100 mg Oral q morning  . vitamin B-12  1,000 mcg Oral Daily   Continuous Infusions: . sodium chloride    . sodium chloride    . sodium chloride Stopped (06/24/20 1500)  . ferric gluconate (FERRLECIT/NULECIT) IV 125 mg (06/27/20 0909)   PRN Meds:.sodium chloride, sodium chloride, acetaminophen, heparin, heparin, HYDROmorphone (DILAUDID) injection, ondansetron (ZOFRAN) IV, oxyCODONE, phenol  Current Labs: reviewed    Physical Exam:  Blood pressure 139/79, pulse 96, temperature 98.8 F (37.1 C), temperature source Oral, resp. rate 18, height '5\' 6"'  (1.676 m), weight 121.4 kg, SpO2 96 %. GEN: Obese, not in acute distress, sitting in chair ENT: NCAT EYES: EOMI CV: Regular, no rub appreciated PULM:  Normal work of breathing, improved air movement in the bases, no wheezing ABD: Soft, nontender, diffuse abdominal wall swelling SKIN: No rashes or lesions EXT: 4+ edema in the legs, trace in the arms Vascular: Left radiocephalic AV fistula with  bruit and thrill but appears to be deep and not ready for cannulation  A 1. CKD 5, now ESRD:  1.  AVF not superficial or ready for cannulation, will req further eval with [facility] as an outpatient 2. 3/2 [facility] with Dr Constance Haw 3. HDstart 3/2 4. HD #3 3/5: tight hep, 2K, 3-4L UF,  5. CLIP in process to outpatient dialysis, followed by Dr. Theador Hawthorne prior to presentation, plan is for [facility] - will be ready for discharge once has a spot  6. Will plan for 4th treatment today   2. Massive volume overload 1. As above 2. In addition, cont diuretics since making some urine, change to torsemide 100 qAM PO-  1000 mls yest 3. Continue sodium and fluid restriction 3. Anemia CKD 1. Cont Aranesp, 130mg weekly 2. Fe Gluconnate 121mIV x 4 in process 3. Trend, no indication for transfusion with Hb > 7 4. Likely CHF, related to #1, TTE LVEF 50%. Mostly this is #1 5. Metabolic acidosis: Corrected with dialysis 6. CKD-BMD: cont C3 and CaAcetate, trend P, is 4.1 today-  PTH 188 7. DM2 8. HTN, no meds, some IDH would not add BP meds at his time    KeLouis Meckel3/10/2020, 7:35 AM  Recent Labs  Lab 06/26/20 0506 06/27/20 1551 06/29/20 0533  NA 137 135 138  K 3.8 3.7 4.0  CL 101 100 101  CO2 22 21* 26  GLUCOSE 144* 187* 143*  BUN 81*  89* 66*  CREATININE 4.89* 5.58* 4.58*  CALCIUM 8.3* 8.0* 8.9  PHOS 5.8* 4.8* 4.1   Recent Labs  Lab 06/22/20 2057 06/23/20 0451 06/26/20 0506 06/27/20 1551 06/29/20 0533  WBC 6.7   < > 6.3 6.4 7.2  NEUTROABS 5.3  --   --   --   --   HGB 7.3*   < > 7.3* 7.2* 7.5*  HCT 25.0*   < > 24.5* 23.4* 26.3*  MCV 94.3   < > 90.7 92.1 94.6  PLT 162   < > 142* 149* 135*   < > = values in this interval not displayed.

## 2020-06-30 ENCOUNTER — Telehealth: Payer: Self-pay | Admitting: Nephrology

## 2020-06-30 NOTE — Telephone Encounter (Signed)
Transition of Care Contact from [place]   Date of Discharge: 06/29/20 Date of Contact: 06/30/20 Method of contact: phone Talked to patient   Patient contacted to discuss transition of care form recent hospitaliztion. Patient was admitted to [facility] from 06/22/20 to 06/29/20 with the discharge diagnosis of  AoC diastolic HF, CKD IV>ESRD now on HD.    Medication changes were reviewed.   Patient will follow up with is outpatient dialysis center 07/01/20.   Other follow up needs include none identified.    Jen Mow, PA-C [facility] Pager: [phone]

## 2020-07-01 ENCOUNTER — Other Ambulatory Visit (HOSPITAL_COMMUNITY): Payer: Self-pay | Admitting: Nephrology

## 2020-07-01 DIAGNOSIS — N186 End stage renal disease: Secondary | ICD-10-CM

## 2020-07-01 DIAGNOSIS — Z992 Dependence on renal dialysis: Secondary | ICD-10-CM

## 2020-07-06 ENCOUNTER — Other Ambulatory Visit: Payer: Self-pay | Admitting: Radiology

## 2020-07-07 ENCOUNTER — Encounter (HOSPITAL_COMMUNITY): Payer: Self-pay

## 2020-07-07 ENCOUNTER — Ambulatory Visit (HOSPITAL_COMMUNITY): Admission: RE | Admit: 2020-07-07 | Payer: Medicaid Other | Source: Ambulatory Visit

## 2020-07-22 ENCOUNTER — Other Ambulatory Visit: Payer: Self-pay | Admitting: *Deleted

## 2020-07-22 ENCOUNTER — Ambulatory Visit: Payer: Medicaid Other | Admitting: Internal Medicine

## 2020-07-22 ENCOUNTER — Encounter: Payer: Self-pay | Admitting: Internal Medicine

## 2020-07-22 DIAGNOSIS — N183 Chronic kidney disease, stage 3 unspecified: Secondary | ICD-10-CM

## 2020-07-27 ENCOUNTER — Ambulatory Visit: Payer: Medicaid Other | Admitting: Vascular Surgery

## 2020-08-03 ENCOUNTER — Other Ambulatory Visit: Payer: Self-pay

## 2020-08-03 ENCOUNTER — Ambulatory Visit: Payer: Medicaid Other

## 2020-08-03 ENCOUNTER — Other Ambulatory Visit: Payer: Self-pay | Admitting: *Deleted

## 2020-08-03 DIAGNOSIS — N183 Chronic kidney disease, stage 3 unspecified: Secondary | ICD-10-CM

## 2020-08-10 ENCOUNTER — Ambulatory Visit (INDEPENDENT_AMBULATORY_CARE_PROVIDER_SITE_OTHER): Payer: Medicaid Other | Admitting: Vascular Surgery

## 2020-08-10 ENCOUNTER — Encounter: Payer: Self-pay | Admitting: Vascular Surgery

## 2020-08-10 ENCOUNTER — Other Ambulatory Visit: Payer: Self-pay

## 2020-08-10 VITALS — BP 193/96 | HR 86 | Temp 98.6°F | Resp 18 | Ht 66.0 in | Wt 229.0 lb

## 2020-08-10 DIAGNOSIS — Z992 Dependence on renal dialysis: Secondary | ICD-10-CM

## 2020-08-10 DIAGNOSIS — N186 End stage renal disease: Secondary | ICD-10-CM

## 2020-08-10 NOTE — Progress Notes (Signed)
[facility] Specialist of [place]  Patient name: Angeljesus Symonds MRN: [id number] DOB: [birth year] Sex: male  REASON FOR VISIT: Follow-up left arm AV fistula  HPI: Basilios Brabec is a 50 y.o. male here today for follow-up.  He is known to me from prior creation of left radiocephalic fistula on [id number].  I saw him in the office 1 week postop and has vein diameter was approximately 4 mm.  The time of surgery was noted to have a moderate sized radial artery with significant calcification.  He now has progressed to renal failure.  Was [facility] to [facility]  Had tunneled catheter placed by Dr. Constance Haw on 06/24/2020.  He is being seen today for evaluation of his fistula.  He has no steal symptoms.  Past Medical History:  Diagnosis Date  . Anemia   . Diabetes mellitus without complication ([place])   . Gout   . High cholesterol   . Hypertension   . Osteoarthritis   . Renal disorder     Family History  Problem Relation Age of Onset  . Stroke Mother   . Diabetes Other   . Hypertension Father     SOCIAL HISTORY: Social History   Tobacco Use  . Smoking status: Never Smoker  . Smokeless tobacco: Never Used  Substance Use Topics  . Alcohol use: Never    Comment: occasional    Allergies  Allergen Reactions  . Gabapentin Anaphylaxis  . Lyrica [Pregabalin] Anaphylaxis    Current Outpatient Medications  Medication Sig Dispense Refill  . allopurinol (ZYLOPRIM) 100 MG tablet Take 100 mg by mouth daily.    Marland Kitchen aspirin EC 81 MG tablet Take 81 mg by mouth daily.    . calcium acetate (PHOSLO) 667 MG capsule Take 667 mg by mouth 3 (three) times daily with meals.    . Cholecalciferol (VITAMIN D) 50 MCG (2000 UT) CAPS Take 2,000 Units by mouth daily.    . Dulaglutide (TRULICITY) A999333 0000000 SOPN Inject 0.75 mg into the skin every Wednesday.    . folic acid (FOLVITE) 1 MG tablet Take 1 tablet (1 mg total) by mouth daily. 30 tablet 3  . glipiZIDE  (GLUCOTROL) 10 MG tablet Take 1 tablet (10 mg total) by mouth 2 (two) times daily before a meal.    . vitamin B-12 1000 MCG tablet Take 1 tablet (1,000 mcg total) by mouth daily. 30 tablet 1  . atorvastatin (LIPITOR) 40 MG tablet Take 40 mg by mouth daily. (Patient not taking: Reported on 08/10/2020)    . calcitRIOL (ROCALTROL) 0.25 MCG capsule Take 0.25 mcg by mouth daily.    Marland Kitchen epoetin alfa-epbx (RETACRIT) 3000 UNIT/ML injection 3,000 Units every 14 (fourteen) days. (Patient not taking: Reported on 08/10/2020)    . loratadine (CLARITIN) 10 MG tablet Take 10 mg by mouth daily. (Patient not taking: Reported on 08/10/2020)    . torsemide (DEMADEX) 100 MG tablet Take 1 tablet (100 mg total) by mouth every morning. (Patient not taking: Reported on 08/10/2020) 30 tablet 0   No current facility-administered medications for this visit.    REVIEW OF SYSTEMS:  '[X]'$  denotes positive finding, '[ ]'$  denotes negative finding Cardiac  Comments:  Chest pain or chest pressure:    Shortness of breath upon exertion:    Short of breath when lying flat:    Irregular heart rhythm:        Vascular    Pain in calf, thigh, or hip brought on by ambulation:  Pain in feet at night that wakes you up from your sleep:     Blood clot in your veins:    Leg swelling:           PHYSICAL EXAM: Vitals:   08/10/20 0829 08/10/20 0833  BP: (!) 196/102 (!) 193/96  Pulse: 88 86  Resp: 18   Temp: 98.6 F (37 C)   TempSrc: Temporal   SpO2: 99%   Weight: 229 lb (103.9 kg)   Height: '5\' 6"'$  (1.676 m)     GENERAL: The patient is a well-nourished male, in no acute distress. The vital signs are documented above. CARDIOVASCULAR: He does have a palpable thrill.  His left radiocephalic incision is well-healed. PULMONARY: There is good air exchange  MUSCULOSKELETAL: There are no major deformities or cyanosis. NEUROLOGIC: No focal weakness or paresthesias are detected. SKIN: There are no ulcers or rashes noted. PSYCHIATRIC: The  patient has a normal affect.  DATA:  He did not have a formal duplex.  I imaged his vein with SonoSite ultrasound.  It is small to moderate size throughout the forearm.  There are no large competing branches throughout the forearm.  It does drain into the cephalic and basilic vein at the antecubital space and the cephalic vein is of much larger caliber above the antecubital space.  In imaging the radiocephalic anastomosis, the radial artery does appear to be quite small and is extremely calcified.  MEDICAL ISSUES: Vein sizes not adequate for access.  I discussed options to include fistulogram to determine if there is an anastomotic problem.  I feel that this would be very unlikely to be productive.  The artery does appear to be very small and I think this is the limitation.  I have recommended conversion to left upper arm AV fistula and feel that he has an excellent chance for success with this.  The vein is already 6 mm in diameter above the antecubital space.  Explained that we would still wait approximately 8 weeks prior to use to allow thickening of the vein.  The vein also runs relatively close to the surface to so I do not feel that it would be a issue of depth.  We will proceed with outpatient surgery at [facility] on this coming Thursday    Rosetta Posner, MD FACS [facility] Tel [phone]  Note: Portions of this report may have been transcribed using voice recognition software.  Every effort has been made to ensure accuracy; however, inadvertent computerized transcription errors may still be present.

## 2020-08-10 NOTE — H&P (View-Only) (Signed)
V[facility] and Vein Specialist of [place]  Patient name: Kevin Meyer MRN: [id number] DOB: 1970-09-01 Sex: male  REASON FOR VISIT: Follow-up left arm AV fistula  HPI: Kevin Meyer is a 50 y.o. male here today for follow-up.  He is known to me from prior creation of left radiocephalic fistula on [id number].  I saw him in the office 1 week postop and has vein diameter was approximately 4 mm.  The time of surgery was noted to have a moderate sized radial artery with significant calcification.  He now has progressed to renal failure.  Was admitted to [facility].  Had tunneled catheter placed by Dr. Constance Haw on 06/24/2020.  He is being seen today for evaluation of his fistula.  He has no steal symptoms.  Past Medical History:  Diagnosis Date  . Anemia   . Diabetes mellitus without complication ([place])   . Gout   . High cholesterol   . Hypertension   . Osteoarthritis   . Renal disorder     Family History  Problem Relation Age of Onset  . Stroke Mother   . Diabetes Other   . Hypertension Father     SOCIAL HISTORY: Social History   Tobacco Use  . Smoking status: Never Smoker  . Smokeless tobacco: Never Used  Substance Use Topics  . Alcohol use: Never    Comment: occasional    Allergies  Allergen Reactions  . Gabapentin Anaphylaxis  . Lyrica [Pregabalin] Anaphylaxis    Current Outpatient Medications  Medication Sig Dispense Refill  . allopurinol (ZYLOPRIM) 100 MG tablet Take 100 mg by mouth daily.    Marland Kitchen aspirin EC 81 MG tablet Take 81 mg by mouth daily.    . calcium acetate (PHOSLO) 667 MG capsule Take 667 mg by mouth 3 (three) times daily with meals.    . Cholecalciferol (VITAMIN D) 50 MCG (2000 UT) CAPS Take 2,000 Units by mouth daily.    . Dulaglutide (TRULICITY) [id number] 0000000 SOPN Inject 0.75 mg into the skin every Wednesday.    . folic acid (FOLVITE) 1 MG tablet Take 1 tablet (1 mg total) by mouth daily. 30 tablet 3  . glipiZIDE  (GLUCOTROL) 10 MG tablet Take 1 tablet (10 mg total) by mouth 2 (two) times daily before a meal.    . vitamin B-12 1000 MCG tablet Take 1 tablet (1,000 mcg total) by mouth daily. 30 tablet 1  . atorvastatin (LIPITOR) 40 MG tablet Take 40 mg by mouth daily. (Patient not taking: Reported on 08/10/2020)    . calcitRIOL (ROCALTROL) 0.25 MCG capsule Take 0.25 mcg by mouth daily.    Marland Kitchen epoetin alfa-epbx (RETACRIT) 3000 UNIT/ML injection 3,000 Units every 14 (fourteen) days. (Patient not taking: Reported on 08/10/2020)    . loratadine (CLARITIN) 10 MG tablet Take 10 mg by mouth daily. (Patient not taking: Reported on 08/10/2020)    . torsemide (DEMADEX) 100 MG tablet Take 1 tablet (100 mg total) by mouth every morning. (Patient not taking: Reported on 08/10/2020) 30 tablet 0   No current facility-administered medications for this visit.    REVIEW OF SYSTEMS:  '[X]'$  denotes positive finding, '[ ]'$  denotes negative finding Cardiac  Comments:  Chest pain or chest pressure:    Shortness of breath upon exertion:    Short of breath when lying flat:    Irregular heart rhythm:        Vascular    Pain in calf, thigh, or hip brought on by ambulation:  Pain in feet at night that wakes you up from your sleep:     Blood clot in your veins:    Leg swelling:           PHYSICAL EXAM: Vitals:   08/10/20 0829 08/10/20 0833  BP: (!) 196/102 (!) 193/96  Pulse: 88 86  Resp: 18   Temp: 98.6 F (37 C)   TempSrc: Temporal   SpO2: 99%   Weight: 229 lb (103.9 kg)   Height: '5\' 6"'$  (1.676 m)     GENERAL: The patient is a well-nourished male, in no acute distress. The vital signs are documented above. CARDIOVASCULAR: He does have a palpable thrill.  His left radiocephalic incision is well-healed. PULMONARY: There is good air exchange  MUSCULOSKELETAL: There are no major deformities or cyanosis. NEUROLOGIC: No focal weakness or paresthesias are detected. SKIN: There are no ulcers or rashes noted. PSYCHIATRIC: The  patient has a normal affect.  DATA:  He did not have a formal duplex.  I imaged his vein with SonoSite ultrasound.  It is small to moderate size throughout the forearm.  There are no large competing branches throughout the forearm.  It does drain into the cephalic and basilic vein at the antecubital space and the cephalic vein is of much larger caliber above the antecubital space.  In imaging the radiocephalic anastomosis, the radial artery does appear to be quite small and is extremely calcified.  MEDICAL ISSUES: Vein sizes not adequate for access.  I discussed options to include fistulogram to determine if there is an anastomotic problem.  I feel that this would be very unlikely to be productive.  The artery does appear to be very small and I think this is the limitation.  I have recommended conversion to left upper arm AV fistula and feel that he has an excellent chance for success with this.  The vein is already 6 mm in diameter above the antecubital space.  Explained that we would still wait approximately 8 weeks prior to use to allow thickening of the vein.  The vein also runs relatively close to the surface to so I do not feel that it would be a issue of depth.  We will proceed with outpatient surgery at [facility] on this coming Thursday    Rosetta Posner, MD FACS [facility] Tel [phone]  Note: Portions of this report may have been transcribed using voice recognition software.  Every effort has been made to ensure accuracy; however, inadvertent computerized transcription errors may still be present.

## 2020-08-12 ENCOUNTER — Other Ambulatory Visit: Payer: Self-pay | Admitting: *Deleted

## 2020-08-12 ENCOUNTER — Other Ambulatory Visit (HOSPITAL_COMMUNITY): Payer: Medicaid Other

## 2020-08-13 ENCOUNTER — Encounter (HOSPITAL_COMMUNITY): Admission: RE | Payer: Self-pay | Source: Home / Self Care

## 2020-08-13 ENCOUNTER — Ambulatory Visit (HOSPITAL_COMMUNITY): Admission: RE | Admit: 2020-08-13 | Payer: Medicaid Other | Source: Home / Self Care | Admitting: Vascular Surgery

## 2020-08-13 SURGERY — ARTERIOVENOUS (AV) FISTULA CREATION
Anesthesia: Choice | Laterality: Left

## 2020-08-17 NOTE — Patient Instructions (Signed)
Your procedure is scheduled on: 08/20/2020  Report to [facility] at   6:15  AM.  Call this number if you have problems the morning of surgery: [phone]   Remember:   Do not Eat or Drink after midnight         No Smoking the morning of surgery  :  Take these medicines the morning of surgery with A SIP OF WATER: none   Do not wear jewelry, make-up or nail polish.  Do not wear lotions, powders, or perfumes. You may wear deodorant.  Do not shave 48 hours prior to surgery. Men may shave face and neck.  Do not bring valuables to the hospital.  Contacts, dentures or bridgework may not be worn into surgery.  Leave suitcase in the car. After surgery it may be brought to your room.  For patients admitted to the hospital, checkout time is 11:00 AM the day of discharge.   Patients discharged the day of surgery will not be allowed to drive home.    Special Instructions: Shower using CHG night before surgery and shower the day of surgery use CHG.  Use special wash - you have one bottle of CHG for all showers.  You should use approximately 1/2 of the bottle for each shower.  How to Use Chlorhexidine for Bathing Chlorhexidine gluconate (CHG) is a germ-killing (antiseptic) solution that is used to clean the skin. It can get rid of the bacteria that normally live on the skin and can keep them away for about 24 hours. To clean your skin with CHG, you may be given:  A CHG solution to use in the shower or as part of a sponge bath.  A prepackaged cloth that contains CHG. Cleaning your skin with CHG may help lower the risk for infection:  While you are staying in the intensive care unit of the hospital.  If you have a vascular access, such as a central line, to provide short-term or long-term access to your veins.  If you have a catheter to drain urine from your bladder.  If you are on a ventilator. A ventilator is a machine that helps you breathe by moving air in and out of your lungs.  After  surgery. What are the risks? Risks of using CHG include:  A skin reaction.  Hearing loss, if CHG gets in your ears.  Eye injury, if CHG gets in your eyes and is not rinsed out.  The CHG product catching fire. Make sure that you avoid smoking and flames after applying CHG to your skin. Do not use CHG:  If you have a chlorhexidine allergy or have previously reacted to chlorhexidine.  On babies younger than 76 months of age. How to use CHG solution  Use CHG only as told by your health care provider, and follow the instructions on the label.  Use the full amount of CHG as directed. Usually, this is one bottle. During a shower Follow these steps when using CHG solution during a shower (unless your health care provider gives you different instructions): 1. Start the shower. 2. Use your normal soap and shampoo to wash your face and hair. 3. Turn off the shower or move out of the shower stream. 4. Pour the CHG onto a clean washcloth. Do not use any type of brush or rough-edged sponge. 5. Starting at your neck, lather your body down to your toes. Make sure you follow these instructions: ? If you will be having surgery, pay special attention  to the part of your body where you will be having surgery. Scrub this area for at least 1 minute. ? Do not use CHG on your head or face. If the solution gets into your ears or eyes, rinse them well with water. ? Avoid your genital area. ? Avoid any areas of skin that have broken skin, cuts, or scrapes. ? Scrub your back and under your arms. Make sure to wash skin folds. 6. Let the lather sit on your skin for 1-2 minutes or as long as told by your health care provider. 7. Thoroughly rinse your entire body in the shower. Make sure that all body creases and crevices are rinsed well. 8. Dry off with a clean towel. Do not put any substances on your body afterward--such as powder, lotion, or perfume--unless you are told to do so by your health care provider.  Only use lotions that are recommended by the manufacturer. 9. Put on clean clothes or pajamas. 10. If it is the night before your surgery, sleep in clean sheets.   During a sponge bath Follow these steps when using CHG solution during a sponge bath (unless your health care provider gives you different instructions): 1. Use your normal soap and shampoo to wash your face and hair. 2. Pour the CHG onto a clean washcloth. 3. Starting at your neck, lather your body down to your toes. Make sure you follow these instructions: ? If you will be having surgery, pay special attention to the part of your body where you will be having surgery. Scrub this area for at least 1 minute. ? Do not use CHG on your head or face. If the solution gets into your ears or eyes, rinse them well with water. ? Avoid your genital area. ? Avoid any areas of skin that have broken skin, cuts, or scrapes. ? Scrub your back and under your arms. Make sure to wash skin folds. 4. Let the lather sit on your skin for 1-2 minutes or as long as told by your health care provider. 5. Using a different clean, wet washcloth, thoroughly rinse your entire body. Make sure that all body creases and crevices are rinsed well. 6. Dry off with a clean towel. Do not put any substances on your body afterward--such as powder, lotion, or perfume--unless you are told to do so by your health care provider. Only use lotions that are recommended by the manufacturer. 7. Put on clean clothes or pajamas. 8. If it is the night before your surgery, sleep in clean sheets. How to use CHG prepackaged cloths  Only use CHG cloths as told by your health care provider, and follow the instructions on the label.  Use the CHG cloth on clean, dry skin.  Do not use the CHG cloth on your head or face unless your health care provider tells you to.  When washing with the CHG cloth: ? Avoid your genital area. ? Avoid any areas of skin that have broken skin, cuts, or  scrapes. Before surgery Follow these steps when using a CHG cloth to clean before surgery (unless your health care provider gives you different instructions): 1. Using the CHG cloth, vigorously scrub the part of your body where you will be having surgery. Scrub using a back-and-forth motion for 3 minutes. The area on your body should be completely wet with CHG when you are done scrubbing. 2. Do not rinse. Discard the cloth and let the area air-dry. Do not put any substances on the area  afterward, such as powder, lotion, or perfume. 3. Put on clean clothes or pajamas. 4. If it is the night before your surgery, sleep in clean sheets.   For general bathing Follow these steps when using CHG cloths for general bathing (unless your health care provider gives you different instructions). 1. Use a separate CHG cloth for each area of your body. Make sure you wash between any folds of skin and between your fingers and toes. Wash your body in the following order, switching to a new cloth after each step: ? The front of your neck, shoulders, and chest. ? Both of your arms, under your arms, and your hands. ? Your stomach and groin area, avoiding the genitals. ? Your right leg and foot. ? Your left leg and foot. ? The back of your neck, your back, and your buttocks. 2. Do not rinse. Discard the cloth and let the area air-dry. Do not put any substances on your body afterward--such as powder, lotion, or perfume--unless you are told to do so by your health care provider. Only use lotions that are recommended by the manufacturer. 3. Put on clean clothes or pajamas. Contact a health care provider if:  Your skin gets irritated after scrubbing.  You have questions about using your solution or cloth. Get help right away if:  Your eyes become very red or swollen.  Your eyes itch badly.  Your skin itches badly and is red or swollen.  Your hearing changes.  You have trouble seeing.  You have swelling or  tingling in your mouth or throat.  You have trouble breathing.  You swallow any chlorhexidine. Summary  Chlorhexidine gluconate (CHG) is a germ-killing (antiseptic) solution that is used to clean the skin. Cleaning your skin with CHG may help to lower your risk for infection.  You may be given CHG to use for bathing. It may be in a bottle or in a prepackaged cloth to use on your skin. Carefully follow your health care provider's instructions and the instructions on the product label.  Do not use CHG if you have a chlorhexidine allergy.  Contact your health care provider if your skin gets irritated after scrubbing. This information is not intended to replace advice given to you by your health care provider. Make sure you discuss any questions you have with your health care provider. Document Revised: 09/27/2019 Document Reviewed: 09/27/2019 Elsevier Patient Education  2021 [employer].  AV Fistula Placement, Care After The following information offers guidance on how to care for yourself after your procedure. Your health care provider may also give you more specific instructions. If you have problems or questions, contact your health care provider. What can I expect after the procedure? After the procedure, it is common to have:  Soreness at the fistula site.  Vibration (thrill) over the fistula. Follow these instructions at home: Medicines  Take over-the-counter and prescription medicines only as told by your health care provider.  Ask your health care provider if the medicine prescribed to you can cause constipation. You may need to take these actions to prevent or treat constipation: ? Drink enough fluid to keep your urine pale yellow. ? Take over-the-counter or prescription medicines. ? Eat foods that are high in fiber, such as beans, whole grains, and fresh fruits and vegetables. ? Limit foods that are high in fat and processed sugars, such as fried or sweet foods. Incision  care Follow instructions from your health care provider about how to take care of your  incision. Make sure you:  Wash your hands with soap and water for at least 20 seconds before and after you change your bandage (dressing). If soap and water are not available, use hand sanitizer.  Change your dressing as told by your health care provider.  Leave stitches (sutures), skin glue, or adhesive strips in place. These skin closures may need to stay in place for 2 weeks or longer. If adhesive strip edges start to loosen and curl up, you may trim the loose edges. Do not remove adhesive strips completely unless your health care provider tells you to do that.   Fistula care  Check your fistula site every day to make sure the thrill feels the same.  Check your fistula site every day for signs of infection. Check for: ? More redness, swelling, or pain. ? Fluid or blood. ? Warmth. ? Pus or a bad smell.  Raise (elevate) the affected area above the level of your heart while you are sitting or lying down.  Do not lift anything that is heavier than 10 lb (4.5 kg), or the limit that you are told, until your health care provider says that it is safe.  Do not lie down on your fistula arm.  Do not let anyone draw blood or take a blood pressure reading on your fistula arm. This is important.  Do not wear tight jewelry or clothing over your fistula arm.   Bathing  Do not take baths, swim, or use a hot tub until your health care provider approves. Ask your health care provider if you may take showers. You may only be allowed to take sponge baths.  Keep the area around your incision clean and dry. General instructions  Rest at home for a day or two.  If you were given a sedative during the procedure, it can affect you for several hours. Do not drive or operate machinery until your health care provider says that it is safe.  Return to your normal activities as told. Ask your health care provider what  activities are safe for you.  Keep all follow-up visits. This is important. Contact a health care provider if:  You have more redness, swelling, or pain around your fistula site.  Your fistula site feels warm to the touch.  You have pus or a bad smell coming from your fistula site.  You have a fever or chills.  You feel numb or cold in your arm or your fistula site.  You feel a decrease or a change in the thrill over the fistula. Get help right away if:  You have bleeding from your fistula site that will not stop.  You have chest pain.  You have trouble breathing. These symptoms may represent a serious problem that is an emergency. Do not wait to see if the symptoms will go away. Get medical help right away. Call your local emergency services (911 in the [place] Do not drive yourself to the hospital. Summary  Follow instructions from your health care provider about how to take care of your incision.  Do not let anyone draw blood or take a blood pressure reading on your fistula arm. This is important.  Contact a health care provider if you have a change in the thrill or have any signs of infection at your fistula site.  Keep all follow-up visits. This is important. This information is not intended to replace advice given to you by your health care provider. Make sure you discuss any questions you  have with your health care provider. Document Revised: 11/20/2019 Document Reviewed: 11/20/2019 Elsevier Patient Education  2021 [place] After This sheet gives you information about how to care for yourself after your procedure. Your health care provider may also give you more specific instructions. If you have problems or questions, contact your health care provider. What can I expect after the procedure? After the procedure, it is common to have:  Tiredness.  Forgetfulness about what happened after the procedure.  Impaired judgment for  important decisions.  Nausea or vomiting.  Some difficulty with balance. Follow these instructions at home: For the time period you were told by your health care provider:  Rest as needed.  Do not participate in activities where you could fall or become injured.  Do not drive or use machinery.  Do not drink alcohol.  Do not take sleeping pills or medicines that cause drowsiness.  Do not make important decisions or sign legal documents.  Do not take care of children on your own.      Eating and drinking  Follow the diet that is recommended by your health care provider.  Drink enough fluid to keep your urine pale yellow.  If you vomit: ? Drink water, juice, or soup when you can drink without vomiting. ? Make sure you have little or no nausea before eating solid foods. General instructions  Have a responsible adult stay with you for the time you are told. It is important to have someone help care for you until you are awake and alert.  Take over-the-counter and prescription medicines only as told by your health care provider.  If you have sleep apnea, surgery and certain medicines can increase your risk for breathing problems. Follow instructions from your health care provider about wearing your sleep device: ? Anytime you are sleeping, including during daytime naps. ? While taking prescription pain medicines, sleeping medicines, or medicines that make you drowsy.  Avoid smoking.  Keep all follow-up visits as told by your health care provider. This is important. Contact a health care provider if:  You keep feeling nauseous or you keep vomiting.  You feel light-headed.  You are still sleepy or having trouble with balance after 24 hours.  You develop a rash.  You have a fever.  You have redness or swelling around the IV site. Get help right away if:  You have trouble breathing.  You have new-onset confusion at home. Summary  For several hours after your  procedure, you may feel tired. You may also be forgetful and have poor judgment.  Have a responsible adult stay with you for the time you are told. It is important to have someone help care for you until you are awake and alert.  Rest as told. Do not drive or operate machinery. Do not drink alcohol or take sleeping pills.  Get help right away if you have trouble breathing, or if you suddenly become confused. This information is not intended to replace advice given to you by your health care provider. Make sure you discuss any questions you have with your health care provider. Document Revised: 12/26/2019 Document Reviewed: 03/14/2019 Elsevier Patient Education  2021 [employer].

## 2020-08-18 ENCOUNTER — Other Ambulatory Visit (HOSPITAL_COMMUNITY)
Admission: RE | Admit: 2020-08-18 | Discharge: 2020-08-18 | Disposition: A | Discharge status: Home / Self Care | Payer: Medicaid Other | Source: Ambulatory Visit | Attending: Vascular Surgery | Admitting: Vascular Surgery

## 2020-08-18 ENCOUNTER — Encounter (HOSPITAL_COMMUNITY): Payer: Self-pay

## 2020-08-18 ENCOUNTER — Encounter (HOSPITAL_COMMUNITY)
Admission: RE | Admit: 2020-08-18 | Discharge: 2020-08-18 | Disposition: A | Discharge status: Home / Self Care | Payer: Medicaid Other | Source: Ambulatory Visit | Attending: Vascular Surgery | Admitting: Vascular Surgery

## 2020-08-18 ENCOUNTER — Other Ambulatory Visit: Payer: Self-pay

## 2020-08-18 DIAGNOSIS — Z01812 Encounter for preprocedural laboratory examination: Secondary | ICD-10-CM | POA: Diagnosis present

## 2020-08-18 DIAGNOSIS — Z20822 Contact with and (suspected) exposure to covid-19: Secondary | ICD-10-CM | POA: Diagnosis not present

## 2020-08-19 LAB — SARS CORONAVIRUS 2 (TAT 6-24 HRS): SARS Coronavirus 2: NEGATIVE

## 2020-08-20 ENCOUNTER — Ambulatory Visit (HOSPITAL_COMMUNITY): Payer: Medicaid Other | Admitting: Anesthesiology

## 2020-08-20 ENCOUNTER — Encounter (HOSPITAL_COMMUNITY)
Admission: RE | Disposition: A | Discharge status: Home / Self Care | Payer: Self-pay | Source: Home / Self Care | Attending: Vascular Surgery

## 2020-08-20 ENCOUNTER — Ambulatory Visit (HOSPITAL_COMMUNITY)
Admission: RE | Admit: 2020-08-20 | Discharge: 2020-08-20 | Disposition: A | Discharge status: Home / Self Care | Payer: Medicaid Other | Attending: Vascular Surgery | Admitting: Vascular Surgery

## 2020-08-20 ENCOUNTER — Encounter (HOSPITAL_COMMUNITY): Payer: Self-pay | Admitting: Vascular Surgery

## 2020-08-20 DIAGNOSIS — Z7982 Long term (current) use of aspirin: Secondary | ICD-10-CM | POA: Diagnosis not present

## 2020-08-20 DIAGNOSIS — Z888 Allergy status to other drugs, medicaments and biological substances status: Secondary | ICD-10-CM | POA: Insufficient documentation

## 2020-08-20 DIAGNOSIS — N186 End stage renal disease: Secondary | ICD-10-CM | POA: Diagnosis not present

## 2020-08-20 DIAGNOSIS — I120 Hypertensive chronic kidney disease with stage 5 chronic kidney disease or end stage renal disease: Secondary | ICD-10-CM | POA: Insufficient documentation

## 2020-08-20 DIAGNOSIS — Z8249 Family history of ischemic heart disease and other diseases of the circulatory system: Secondary | ICD-10-CM | POA: Diagnosis not present

## 2020-08-20 DIAGNOSIS — Z833 Family history of diabetes mellitus: Secondary | ICD-10-CM | POA: Diagnosis not present

## 2020-08-20 DIAGNOSIS — Z79899 Other long term (current) drug therapy: Secondary | ICD-10-CM | POA: Insufficient documentation

## 2020-08-20 DIAGNOSIS — E1122 Type 2 diabetes mellitus with diabetic chronic kidney disease: Secondary | ICD-10-CM | POA: Diagnosis not present

## 2020-08-20 DIAGNOSIS — N185 Chronic kidney disease, stage 5: Secondary | ICD-10-CM

## 2020-08-20 HISTORY — PX: AV FISTULA PLACEMENT: SHX1204

## 2020-08-20 HISTORY — PX: LIGATION OF ARTERIOVENOUS  FISTULA: SHX5948

## 2020-08-20 LAB — POCT I-STAT, CHEM 8
BUN: 45 mg/dL — ABNORMAL HIGH (ref 6–20)
Calcium, Ion: 1.16 mmol/L (ref 1.15–1.40)
Chloride: 97 mmol/L — ABNORMAL LOW (ref 98–111)
Creatinine, Ser: 5.2 mg/dL — ABNORMAL HIGH (ref 0.61–1.24)
Glucose, Bld: 275 mg/dL — ABNORMAL HIGH (ref 70–99)
HCT: 44 % (ref 39.0–52.0)
Hemoglobin: 15 g/dL (ref 13.0–17.0)
Potassium: 4.5 mmol/L (ref 3.5–5.1)
Sodium: 137 mmol/L (ref 135–145)
TCO2: 30 mmol/L (ref 22–32)

## 2020-08-20 LAB — GLUCOSE, CAPILLARY: Glucose-Capillary: 257 mg/dL — ABNORMAL HIGH (ref 70–99)

## 2020-08-20 SURGERY — ARTERIOVENOUS (AV) FISTULA CREATION
Anesthesia: General | Site: Arm Lower | Laterality: Left

## 2020-08-20 MED ORDER — METOCLOPRAMIDE HCL 5 MG/ML IJ SOLN
INTRAMUSCULAR | Status: AC
  Filled 2020-08-20: qty 2

## 2020-08-20 MED ORDER — ONDANSETRON HCL 4 MG/2ML IJ SOLN: 4.0000 mg | Freq: Once | INTRAMUSCULAR | Status: DC | PRN

## 2020-08-20 MED ORDER — FENTANYL CITRATE (PF) 100 MCG/2ML IJ SOLN
INTRAMUSCULAR | Status: AC
  Filled 2020-08-20: qty 2

## 2020-08-20 MED ORDER — MIDAZOLAM HCL 2 MG/2ML IJ SOLN
INTRAMUSCULAR | Status: AC
  Filled 2020-08-20: qty 2

## 2020-08-20 MED ORDER — ORAL CARE MOUTH RINSE: 15.0000 mL | Freq: Once | OROMUCOSAL | Status: AC

## 2020-08-20 MED ORDER — CEFAZOLIN SODIUM-DEXTROSE 2-4 GM/100ML-% IV SOLN
2.0000 g | INTRAVENOUS | Status: AC
  Administered 2020-08-20: 2 g via INTRAVENOUS
  Filled 2020-08-20: qty 100

## 2020-08-20 MED ORDER — OXYCODONE-ACETAMINOPHEN 5-325 MG PO TABS: 1.0000 | ORAL_TABLET | Freq: Four times a day (QID) | ORAL | 0 refills | Status: DC | PRN

## 2020-08-20 MED ORDER — SODIUM CHLORIDE 0.9 % IV SOLN
INTRAVENOUS | Status: DC | PRN
  Administered 2020-08-20: 500 mL

## 2020-08-20 MED ORDER — KETAMINE HCL 10 MG/ML IJ SOLN
INTRAMUSCULAR | Status: DC | PRN
  Administered 2020-08-20: 20 mg via INTRAVENOUS
  Administered 2020-08-20: 10 mg via INTRAVENOUS
  Administered 2020-08-20: 20 mg via INTRAVENOUS

## 2020-08-20 MED ORDER — PHENYLEPHRINE 40 MCG/ML (10ML) SYRINGE FOR IV PUSH (FOR BLOOD PRESSURE SUPPORT)
PREFILLED_SYRINGE | INTRAVENOUS | Status: AC
  Filled 2020-08-20: qty 30

## 2020-08-20 MED ORDER — CHLORHEXIDINE GLUCONATE 4 % EX LIQD: 60.0000 mL | Freq: Once | CUTANEOUS | Status: DC

## 2020-08-20 MED ORDER — PROPOFOL 10 MG/ML IV BOLUS
INTRAVENOUS | Status: AC
  Filled 2020-08-20: qty 20

## 2020-08-20 MED ORDER — LIDOCAINE-EPINEPHRINE 0.5 %-1:200000 IJ SOLN
INTRAMUSCULAR | Status: AC
  Filled 2020-08-20: qty 1

## 2020-08-20 MED ORDER — PHENYLEPHRINE HCL (PRESSORS) 10 MG/ML IV SOLN
INTRAVENOUS | Status: DC | PRN
  Administered 2020-08-20: 120 ug via INTRAVENOUS
  Administered 2020-08-20: 100 ug via INTRAVENOUS
  Administered 2020-08-20: 120 ug via INTRAVENOUS
  Administered 2020-08-20 (×3): 100 ug via INTRAVENOUS
  Administered 2020-08-20: 120 ug via INTRAVENOUS
  Administered 2020-08-20: 80 ug via INTRAVENOUS
  Administered 2020-08-20: 200 ug via INTRAVENOUS
  Administered 2020-08-20: 160 ug via INTRAVENOUS

## 2020-08-20 MED ORDER — PROPOFOL 10 MG/ML IV BOLUS
INTRAVENOUS | Status: DC | PRN
  Administered 2020-08-20: 150 mg via INTRAVENOUS

## 2020-08-20 MED ORDER — ONDANSETRON HCL 4 MG/2ML IJ SOLN
INTRAMUSCULAR | Status: AC
  Filled 2020-08-20: qty 2

## 2020-08-20 MED ORDER — SODIUM CHLORIDE 0.9 % IV SOLN: INTRAVENOUS | Status: DC

## 2020-08-20 MED ORDER — HEPARIN SODIUM (PORCINE) 1000 UNIT/ML IJ SOLN
INTRAMUSCULAR | Status: AC
  Filled 2020-08-20: qty 6

## 2020-08-20 MED ORDER — FENTANYL CITRATE (PF) 100 MCG/2ML IJ SOLN: 25.0000 ug | INTRAMUSCULAR | Status: DC | PRN

## 2020-08-20 MED ORDER — CHLORHEXIDINE GLUCONATE 0.12 % MT SOLN
OROMUCOSAL | Status: AC
  Filled 2020-08-20: qty 15

## 2020-08-20 MED ORDER — FENTANYL CITRATE (PF) 100 MCG/2ML IJ SOLN
INTRAMUSCULAR | Status: DC | PRN
  Administered 2020-08-20 (×2): 50 ug via INTRAVENOUS

## 2020-08-20 MED ORDER — LIDOCAINE-EPINEPHRINE 0.5 %-1:200000 IJ SOLN
INTRAMUSCULAR | Status: DC | PRN
  Administered 2020-08-20: 6 mL
  Administered 2020-08-20: 1 mL

## 2020-08-20 MED ORDER — SEVOFLURANE IN SOLN
RESPIRATORY_TRACT | Status: AC
  Filled 2020-08-20: qty 250

## 2020-08-20 MED ORDER — CHLORHEXIDINE GLUCONATE 0.12 % MT SOLN
15.0000 mL | Freq: Once | OROMUCOSAL | Status: AC
  Administered 2020-08-20: 15 mL via OROMUCOSAL

## 2020-08-20 MED ORDER — METOCLOPRAMIDE HCL 5 MG/ML IJ SOLN
INTRAMUSCULAR | Status: DC | PRN
  Administered 2020-08-20: 10 mg via INTRAVENOUS

## 2020-08-20 MED ORDER — ONDANSETRON HCL 4 MG/2ML IJ SOLN
INTRAMUSCULAR | Status: DC | PRN
  Administered 2020-08-20: 4 mg via INTRAVENOUS

## 2020-08-20 MED ORDER — LIDOCAINE HCL (CARDIAC) PF 100 MG/5ML IV SOSY
PREFILLED_SYRINGE | INTRAVENOUS | Status: DC | PRN
  Administered 2020-08-20: 60 mg via INTRAVENOUS

## 2020-08-20 MED ORDER — MIDAZOLAM HCL 5 MG/5ML IJ SOLN
INTRAMUSCULAR | Status: DC | PRN
  Administered 2020-08-20: 2 mg via INTRAVENOUS

## 2020-08-20 MED ORDER — SODIUM CHLORIDE 0.9 % IV SOLN
INTRAVENOUS | Status: DC
  Administered 2020-08-20: 500 mL via INTRAVENOUS

## 2020-08-20 MED ORDER — LIDOCAINE HCL (PF) 2 % IJ SOLN
INTRAMUSCULAR | Status: AC
  Filled 2020-08-20: qty 5

## 2020-08-20 MED ORDER — KETAMINE HCL 50 MG/5ML IJ SOSY
PREFILLED_SYRINGE | INTRAMUSCULAR | Status: AC
  Filled 2020-08-20: qty 5

## 2020-08-20 MED ORDER — 0.9 % SODIUM CHLORIDE (POUR BTL) OPTIME
TOPICAL | Status: DC | PRN
  Administered 2020-08-20: 1000 mL

## 2020-08-20 MED ORDER — MEPERIDINE HCL 50 MG/ML IJ SOLN: 6.2500 mg | INTRAMUSCULAR | Status: DC | PRN

## 2020-08-20 SURGICAL SUPPLY — 40 items
ARMBAND PINK RESTRICT EXTREMIT (MISCELLANEOUS) ×3 IMPLANT
BAG HAMPER (MISCELLANEOUS) ×3 IMPLANT
CANNULA VESSEL 3MM 2 BLNT TIP (CANNULA) ×3 IMPLANT
CLIP LIGATING EXTRA MED SLVR (CLIP) ×3 IMPLANT
CLIP LIGATING EXTRA SM BLUE (MISCELLANEOUS) ×3 IMPLANT
COVER LIGHT HANDLE STERIS (MISCELLANEOUS) ×6 IMPLANT
COVER MAYO STAND XLG (MISCELLANEOUS) ×3 IMPLANT
COVER PROBE W GEL 5X96 (DRAPES) ×3 IMPLANT
COVER WAND RF STERILE (DRAPES) ×3 IMPLANT
DECANTER SPIKE VIAL GLASS SM (MISCELLANEOUS) ×3 IMPLANT
DERMABOND ADVANCED (GAUZE/BANDAGES/DRESSINGS) ×1
DERMABOND ADVANCED .7 DNX12 (GAUZE/BANDAGES/DRESSINGS) ×2 IMPLANT
ELECT REM PT RETURN 9FT ADLT (ELECTROSURGICAL) ×3
ELECTRODE REM PT RTRN 9FT ADLT (ELECTROSURGICAL) ×2 IMPLANT
GAUZE SPONGE 4X4 12PLY STRL (GAUZE/BANDAGES/DRESSINGS) ×3 IMPLANT
GLOVE SS BIOGEL STRL SZ 7.5 (GLOVE) ×4 IMPLANT
GLOVE SUPERSENSE BIOGEL SZ 7.5 (GLOVE) ×2
GLOVE SURG UNDER POLY LF SZ7 (GLOVE) ×9 IMPLANT
GOWN STRL REUS W/TWL LRG LVL3 (GOWN DISPOSABLE) ×9 IMPLANT
IV NS 500ML (IV SOLUTION) ×1
IV NS 500ML BAXH (IV SOLUTION) ×2 IMPLANT
KIT BLADEGUARD II DBL (SET/KITS/TRAYS/PACK) ×3 IMPLANT
KIT TURNOVER KIT A (KITS) ×3 IMPLANT
MANIFOLD NEPTUNE II (INSTRUMENTS) ×3 IMPLANT
MARKER SKIN DUAL TIP RULER LAB (MISCELLANEOUS) ×6 IMPLANT
NEEDLE HYPO 18GX1.5 BLUNT FILL (NEEDLE) ×3 IMPLANT
NS IRRIG 1000ML POUR BTL (IV SOLUTION) ×3 IMPLANT
PACK CV ACCESS (CUSTOM PROCEDURE TRAY) ×3 IMPLANT
PAD ARMBOARD 7.5X6 YLW CONV (MISCELLANEOUS) ×6 IMPLANT
SET BASIN LINEN APH (SET/KITS/TRAYS/PACK) ×3 IMPLANT
SOL PREP POV-IOD 4OZ 10% (MISCELLANEOUS) ×3 IMPLANT
SOL PREP PROV IODINE SCRUB 4OZ (MISCELLANEOUS) ×3 IMPLANT
SUT PROLENE 6 0 CC (SUTURE) ×6 IMPLANT
SUT SILK 2 0 SH (SUTURE) IMPLANT
SUT VIC AB 3-0 SH 27 (SUTURE) ×2
SUT VIC AB 3-0 SH 27X BRD (SUTURE) ×4 IMPLANT
SYR 10ML LL (SYRINGE) ×3 IMPLANT
SYR BULB IRRIG 60ML STRL (SYRINGE) ×3 IMPLANT
SYR CONTROL 10ML LL (SYRINGE) ×3 IMPLANT
UNDERPAD 30X36 HEAVY ABSORB (UNDERPADS AND DIAPERS) ×3 IMPLANT

## 2020-08-20 NOTE — Anesthesia Preprocedure Evaluation (Signed)
Anesthesia Evaluation  Patient identified by MRN, date of birth, ID band Patient awake    Reviewed: Allergy & Precautions, NPO status , Patient's Chart, lab work & pertinent test results  History of Anesthesia Complications Negative for: history of anesthetic complications  Airway Mallampati: III  TM Distance: >3 FB Neck ROM: Full    Dental  (+) Dental Advisory Given, Missing   Pulmonary sleep apnea ,    Pulmonary exam normal breath sounds clear to auscultation       Cardiovascular Exercise Tolerance: Poor hypertension, Pt. on medications and Pt. on home beta blockers +CHF  Normal cardiovascular exam Rhythm:Regular Rate:Normal  1. Left ventricular ejection fraction, by estimation, is 50%. The left  ventricle has low normal function. The left ventricle has no regional wall  motion abnormalities. There is mild left ventricular hypertrophy. Left  ventricular diastolic parameters are  indeterminate.  2. Right ventricular systolic function is normal. The right ventricular  size is normal.  3. Left atrial size was mildly dilated.  4. A small pericardial effusion is present. The pericardial effusion is  circumferential.  5. The mitral valve is normal in structure. No evidence of mitral valve  regurgitation. No evidence of mitral stenosis.  6. The aortic valve is tricuspid. There is mild calcification of the  aortic valve. There is mild thickening of the aortic valve. Aortic valve  regurgitation is not visualized. No aortic stenosis is present.  7. The inferior vena cava is normal in size with greater than 50%  respiratory variability, suggesting right atrial pressure of 3 mmHg.   22-Jun-2020 20:47:25 [facility] ROUTINE RECORD Sinus tachycardia Borderline repolarization abnormality T wave abnormality Abnormal ECG Confirmed by Carmin Muskrat [phone]) on 06/22/2020 9:07:56 PM   Neuro/Psych negative  neurological ROS  negative psych ROS   GI/Hepatic GERD  Controlled,  Endo/Other  diabetes, Well Controlled, Type 2, Oral Hypoglycemic Agents  Renal/GU ESRFRenal disease     Musculoskeletal  (+) Arthritis ,   Abdominal   Peds  Hematology  (+) Blood dyscrasia, anemia ,   Anesthesia Other Findings   Reproductive/Obstetrics                            Anesthesia Physical  Anesthesia Plan  ASA: III  Anesthesia Plan: General   Post-op Pain Management:    Induction: Intravenous  PONV Risk Score and Plan: 3 and Ondansetron  Airway Management Planned: LMA  Additional Equipment:   Intra-op Plan:   Post-operative Plan: Extubation in OR  Informed Consent: I have reviewed the patients History and Physical, chart, labs and discussed the procedure including the risks, benefits and alternatives for the proposed anesthesia with the patient or authorized representative who has indicated his/her understanding and acceptance.       Plan Discussed with: CRNA and Surgeon  Anesthesia Plan Comments:        Anesthesia Quick Evaluation

## 2020-08-20 NOTE — Anesthesia Postprocedure Evaluation (Signed)
Anesthesia Post Note  Patient: Abelino Tano  Procedure(s) Performed: LEFT ARM BRACHIAL CEPHALIC ARTERIOVENOUS (AV) FISTULA CREATION (Left ) LIGATION OF  LEFT RADIOCEPHALIC ARTERIOVENOUS  FISTULA (Left Arm Lower)  Patient location during evaluation: PACU Anesthesia Type: General Level of consciousness: awake and alert and oriented Pain management: pain level controlled Vital Signs Assessment: post-procedure vital signs reviewed and stable Respiratory status: spontaneous breathing and respiratory function stable Cardiovascular status: stable and blood pressure returned to baseline Postop Assessment: no apparent nausea or vomiting Anesthetic complications: no   No complications documented.   Last Vitals:  Vitals:   08/20/20 0915 08/20/20 0947  BP: (!) 166/84 (!) 165/87  Pulse: 96 100  Resp: 19 18  Temp:  36.7 C  SpO2: 96% 96%    Last Pain:  Vitals:   08/20/20 0947  TempSrc: Oral  PainSc: 0-No pain                 Kennetha Pearman C Daphine Loch

## 2020-08-20 NOTE — Transfer of Care (Signed)
Immediate Anesthesia Transfer of Care Note  Patient: Lavon Richards  Procedure(s) Performed: LEFT ARM BRACHIAL CEPHALIC ARTERIOVENOUS (AV) FISTULA CREATION (Left ) LIGATION OF  LEFT RADIOCEPHALIC ARTERIOVENOUS  FISTULA (Left Arm Lower)  Patient Location: PACU  Anesthesia Type:General  Level of Consciousness: awake, alert , oriented and patient cooperative  Airway & Oxygen Therapy: Patient Spontanous Breathing and Patient connected to face mask oxygen  Post-op Assessment: Report given to RN, Post -op Vital signs reviewed and stable and Patient moving all extremities X 4  Post vital signs: Reviewed and stable  Last Vitals:  Vitals Value Taken Time  BP 158/86 08/20/20 0905  Temp    Pulse 95 08/20/20 0906  Resp 20 08/20/20 0906  SpO2 100 % 08/20/20 0906  Vitals shown include unvalidated device data.  Last Pain:  Vitals:   08/20/20 0654  TempSrc: Oral  PainSc: 0-No pain      Patients Stated Pain Goal: 8 ([id number])  Complications: No complications documented.

## 2020-08-20 NOTE — Anesthesia Procedure Notes (Signed)
Procedure Name: LMA Insertion Date/Time: 08/20/2020 7:38 AM Performed by: Jonna Munro, CRNA Pre-anesthesia Checklist: Patient identified, Emergency Drugs available, Suction available, Patient being monitored and Timeout performed Patient Re-evaluated:Patient Re-evaluated prior to induction Oxygen Delivery Method: Circle system utilized Preoxygenation: Pre-oxygenation with 100% oxygen Induction Type: IV induction LMA: LMA inserted LMA Size: 5.0 Number of attempts: 1 Placement Confirmation: positive ETCO2 and breath sounds checked- equal and bilateral Tube secured with: Tape Dental Injury: Teeth and Oropharynx as per pre-operative assessment

## 2020-08-20 NOTE — Interval H&P Note (Signed)
History and Physical Interval Note:  08/20/2020 7:16 AM  Kevin Meyer  has presented today for surgery, with the diagnosis of ESRD.  The various methods of treatment have been discussed with the patient and family. After consideration of risks, benefits and other options for treatment, the patient has consented to  Procedure(s): LEFT ARM ARTERIOVENOUS (AV) FISTULA CREATION (Left) as a surgical intervention.  The patient's history has been reviewed, patient examined, no change in status, stable for surgery.  I have reviewed the patient's chart and labs.  Questions were answered to the patient's satisfaction.     Curt Jews

## 2020-08-20 NOTE — Discharge Instructions (Signed)
AV Fistula Placement, Care After The following information offers guidance on how to care for yourself after your procedure. Your health care provider may also give you more specific instructions. If you have problems or questions, contact your health care provider. What can I expect after the procedure? After the procedure, it is common to have:  Soreness at the fistula site.  Vibration (thrill) over the fistula. Follow these instructions at home: Medicines  Take over-the-counter and prescription medicines only as told by your health care provider.  Ask your health care provider if the medicine prescribed to you can cause constipation. You may need to take these actions to prevent or treat constipation: ? Drink enough fluid to keep your urine pale yellow. ? Take over-the-counter or prescription medicines. ? Eat foods that are high in fiber, such as beans, whole grains, and fresh fruits and vegetables. ? Limit foods that are high in fat and processed sugars, such as fried or sweet foods. Incision care Follow instructions from your health care provider about how to take care of your incision. Make sure you:  Wash your hands with soap and water for at least 20 seconds before and after you change your bandage (dressing). If soap and water are not available, use hand sanitizer.  Change your dressing as told by your health care provider.  Leave stitches (sutures), skin glue, or adhesive strips in place. These skin closures may need to stay in place for 2 weeks or longer. If adhesive strip edges start to loosen and curl up, you may trim the loose edges. Do not remove adhesive strips completely unless your health care provider tells you to do that.   Fistula care  Check your fistula site every day to make sure the thrill feels the same.  Check your fistula site every day for signs of infection. Check for: ? More redness, swelling, or pain. ? Fluid or blood. ? Warmth. ? Pus or a bad  smell.  Raise (elevate) the affected area above the level of your heart while you are sitting or lying down.  Do not lift anything that is heavier than 10 lb (4.5 kg), or the limit that you are told, until your health care provider says that it is safe.  Do not lie down on your fistula arm.  Do not let anyone draw blood or take a blood pressure reading on your fistula arm. This is important.  Do not wear tight jewelry or clothing over your fistula arm.   Bathing  Do not take baths, swim, or use a hot tub until your health care provider approves. Ask your health care provider if you may take showers. You may only be allowed to take sponge baths.  Keep the area around your incision clean and dry. General instructions  Rest at home for a day or two.  If you were given a sedative during the procedure, it can affect you for several hours. Do not drive or operate machinery until your health care provider says that it is safe.  Return to your normal activities as told. Ask your health care provider what activities are safe for you.  Keep all follow-up visits. This is important. Contact a health care provider if:  You have more redness, swelling, or pain around your fistula site.  Your fistula site feels warm to the touch.  You have pus or a bad smell coming from your fistula site.  You have a fever or chills.  You feel numb or cold in  your arm or your fistula site.  You feel a decrease or a change in the thrill over the fistula. Get help right away if:  You have bleeding from your fistula site that will not stop.  You have chest pain.  You have trouble breathing. These symptoms may represent a serious problem that is an emergency. Do not wait to see if the symptoms will go away. Get medical help right away. Call your local emergency services (911 in the [place] Do not drive yourself to the hospital. Summary  Follow instructions from your health care provider about how to take  care of your incision.  Do not let anyone draw blood or take a blood pressure reading on your fistula arm. This is important.  Contact a health care provider if you have a change in the thrill or have any signs of infection at your fistula site.  Keep all follow-up visits. This is important. This information is not intended to replace advice given to you by your health care provider. Make sure you discuss any questions you have with your health care provider. Document Revised: 11/20/2019 Document Reviewed: 11/20/2019 Elsevier Patient Education  [place].

## 2020-08-20 NOTE — Op Note (Signed)
    OPERATIVE REPORT  DATE OF SURGERY: 08/20/2020  PATIENT: Kevin Meyer, 50 y.o. male MRN: [id number]  DOB: 29-Aug-1970  PRE-OPERATIVE DIAGNOSIS: End-stage renal disease  POST-OPERATIVE DIAGNOSIS:  Same  PROCEDURE: #1 ligation of left radiocephalic fistula, #2 creation of left brachiocephalic fistula  SURGEON:  Curt Jews, M.D.  PHYSICIAN ASSISTANT: Nurse  The assistant was needed for exposure and to expedite the case  ANESTHESIA: LMA  EBL: per anesthesia record  Total I/O In: 300 [I.V.:200; IV Piggyback:100] Out: 5 [Blood:5]  BLOOD ADMINISTERED: none  DRAINS: none  SPECIMEN: none  COUNTS CORRECT:  YES  PATIENT DISPOSITION:  PACU - hemodynamically stable  PROCEDURE DETAILS: Patient was taken operating placed supine position with area of the left arm from treatment sterile fashion.  Incision was made over the antecubital space and carried down to isolate the cephalic vein which was of large caliber.  The vein was mobilized proximally distally and tributary branches were ligated with 3-0 silk ties and divided.  The brachial artery was exposed through the same incision.  The artery was of large caliber but had extensive calcification present.  The vein was occluded at the antecubital vein and the cephalic vein was excised from the antecubital vein.  The basilic vein was left intact.  The resultant defect in the junction of the antecubital and basilic vein was oversewn with a 6-0 Prolene suture.  The cephalic vein was mobilized to the level of the brachial artery.  The brachial artery was occluded proximally and distally with fistula clamps.  The artery was opened with an 11 blade and sent longitudinally with Potts scissors.  The artery was extremely calcified.  The cephalic vein was spatulated and sewn end-to-side to the artery with a running 6-0 Prolene suture.  Clamps removed and excellent thrill was noted.  Next incision was made over the prior incision over the cephalic vein at  the radial artery anastomosis.  The vein was doubly ligated with 2-0 silk tie.  The wounds were irrigated with saline.  Hemostasis was obtained with electrocautery.  The wounds were closed with 3-0 Vicryl in the subcutaneous and subcuticular tissue.  Sterile dressing was applied and the patient was transferred to the recovery room in stable condition   Rosetta Posner, M.D., [facility] 08/20/2020 9:03 AM  Note: Portions of this report may have been transcribed using voice recognition software.  Every effort has been made to ensure accuracy; however, inadvertent computerized transcription errors may still be present.

## 2020-08-24 ENCOUNTER — Encounter (HOSPITAL_COMMUNITY): Payer: Self-pay | Admitting: Vascular Surgery

## 2020-08-26 NOTE — Progress Notes (Deleted)
  POST OPERATIVE OFFICE NOTE    CC:  F/u for surgery  HPI:  This is a 50 y.o. male who is s/p ligation of left RC AVF and creation of left BC AVF on 08/20/2020 by Dr. Donnetta Hutching.   Pt states ***he does *** have pain/numbness in *** hand.    The pt *** on dialysis *** at *** location.   Allergies  Allergen Reactions  . Gabapentin Anaphylaxis  . Lyrica [Pregabalin] Anaphylaxis    Current Outpatient Medications  Medication Sig Dispense Refill  . allopurinol (ZYLOPRIM) 100 MG tablet Take 100 mg by mouth daily.    Marland Kitchen aspirin EC 81 MG tablet Take 81 mg by mouth daily.    Marland Kitchen atorvastatin (LIPITOR) 40 MG tablet Take 40 mg by mouth at bedtime.    . calcium acetate (PHOSLO) 667 MG capsule Take 667 mg by mouth 3 (three) times daily with meals.    . Cholecalciferol (VITAMIN D) 50 MCG (2000 UT) CAPS Take 2,000 Units by mouth daily.    . Dulaglutide (TRULICITY) A999333 0000000 SOPN Inject 0.75 mg into the skin every Wednesday.    Marland Kitchen epoetin alfa-epbx (RETACRIT) 3000 UNIT/ML injection 3,000 Units every 14 (fourteen) days.    . folic acid (FOLVITE) 1 MG tablet Take 1 tablet (1 mg total) by mouth daily. 30 tablet 3  . glipiZIDE (GLUCOTROL) 10 MG tablet Take 1 tablet (10 mg total) by mouth 2 (two) times daily before a meal.    . multivitamin (RENA-VIT) TABS tablet Take 1 tablet by mouth daily.    Marland Kitchen oxyCODONE-acetaminophen (PERCOCET) 5-325 MG tablet Take 1 tablet by mouth every 6 (six) hours as needed for severe pain. 8 tablet 0  . torsemide (DEMADEX) 100 MG tablet Take 1 tablet (100 mg total) by mouth every morning. 30 tablet 0  . vitamin B-12 1000 MCG tablet Take 1 tablet (1,000 mcg total) by mouth daily. 30 tablet 1   No current facility-administered medications for this visit.     ROS:  See HPI  Physical Exam:  ***  Incision:  *** Extremities:   There *** a palpable *** pulse.   Motor and sensory *** in tact.   There *** a thrill/bruit present.  The fistula/graft *** easily  palpable   Dialysis Duplex on 08/26/2020: Diameter:  *** Depth:  ***   Assessment/Plan:  This is a 50 y.o. male who is s/p: ligation of left RC AVF and creation of left BC AVF on 08/20/2020 by Dr. Donnetta Hutching.  -the pt does *** have evidence of steal. -the fistula/graft can be used ***. -If pt has a tunneled dialysis catheter and the access has been used successfully to the satisfaction of the dialysis center, the tunneled catheter can be scheduled to be removed at their discretion.   -the pt will follow up ***   Leontine Locket, [facility] [phone]  Clinic MD:  Oneida Alar

## 2020-08-27 ENCOUNTER — Ambulatory Visit: Payer: Medicaid Other

## 2020-08-31 ENCOUNTER — Encounter: Payer: Self-pay | Admitting: Vascular Surgery

## 2020-08-31 ENCOUNTER — Other Ambulatory Visit: Payer: Self-pay

## 2020-08-31 ENCOUNTER — Ambulatory Visit (INDEPENDENT_AMBULATORY_CARE_PROVIDER_SITE_OTHER): Payer: Medicaid Other | Admitting: Vascular Surgery

## 2020-08-31 VITALS — BP 151/74 | HR 100 | Temp 97.2°F | Resp 16 | Ht 66.0 in | Wt 222.0 lb

## 2020-08-31 DIAGNOSIS — Z992 Dependence on renal dialysis: Secondary | ICD-10-CM

## 2020-08-31 DIAGNOSIS — N186 End stage renal disease: Secondary | ICD-10-CM

## 2020-08-31 NOTE — H&P (View-Only) (Signed)
[facility] of [place]  Patient name: Kevin Meyer MRN: [id number] DOB: 1970/09/02 Sex: male  REASON FOR VISIT: Evaluation of poorly functioning right IJ tunneled hemodialysis catheter  HPI: Kevin Meyer is a 50 y.o. male here today for evaluation of his catheter.  When I initially saw him he was chronic renal failure but then progressed to end-stage renal disease while hospitalized.  He underwent a tunneled hemodialysis catheter acutely with Dr. Constance Haw.  He initially had a radiocephalic fistula placed by myself but had poor maturation most likely related arterial inflow.  2 weeks ago he underwent conversion of this to a upper arm fistula.  He is now having difficulty with flow in his hemodialysis catheter and is seen today for discussion of this.  He did have adequate dialysis this morning although apparently was alarming the machine.  Current Outpatient Medications  Medication Sig Dispense Refill  . allopurinol (ZYLOPRIM) 100 MG tablet Take 100 mg by mouth daily.    Marland Kitchen aspirin EC 81 MG tablet Take 81 mg by mouth daily.    Marland Kitchen atorvastatin (LIPITOR) 40 MG tablet Take 40 mg by mouth at bedtime.    . calcium acetate (PHOSLO) 667 MG capsule Take 667 mg by mouth 3 (three) times daily with meals.    . Cholecalciferol (VITAMIN D) 50 MCG (2000 UT) CAPS Take 2,000 Units by mouth daily.    . Dulaglutide (TRULICITY) A999333 0000000 SOPN Inject 0.75 mg into the skin every Wednesday.    Marland Kitchen epoetin alfa-epbx (RETACRIT) 3000 UNIT/ML injection 3,000 Units every 14 (fourteen) days.    . folic acid (FOLVITE) 1 MG tablet Take 1 tablet (1 mg total) by mouth daily. 30 tablet 3  . glipiZIDE (GLUCOTROL) 10 MG tablet Take 1 tablet (10 mg total) by mouth 2 (two) times daily before a meal.    . multivitamin (RENA-VIT) TABS tablet Take 1 tablet by mouth daily.    Marland Kitchen oxyCODONE-acetaminophen (PERCOCET) 5-325 MG tablet Take 1 tablet by mouth every 6 (six) hours as needed for  severe pain. 8 tablet 0  . torsemide (DEMADEX) 100 MG tablet Take 1 tablet (100 mg total) by mouth every morning. 30 tablet 0  . vitamin B-12 1000 MCG tablet Take 1 tablet (1,000 mcg total) by mouth daily. 30 tablet 1   No current facility-administered medications for this visit.     PHYSICAL EXAM: Vitals:   08/31/20 1407  BP: (!) 151/74  Pulse: 100  Resp: 16  Temp: (!) 97.2 F (36.2 C)  TempSrc: Other (Comment)  SpO2: 98%  Weight: 222 lb (100.7 kg)  Height: '5\' 6"'$  (1.676 m)    GENERAL: The patient is a well-nourished male, in no acute distress. The vital signs are documented above. His left upper arm fistula has excellent Sheila Gervasi maturation with good size and very superficial.  Has an excellent thrill and his antecubital incision is healing nicely.  MEDICAL ISSUES: Patient will need another 2-1/2 months of catheter use before using his new left brachiocephalic AV fistula.  Will plan on placing a new catheter.  Discussed replacement over a wire and also possible replacing in the left IJ.  This will be done as an outpatient this coming Thursday at [facility] on 09/03/2020   Rosetta Posner, MD FACS [facility] [phone]  Note: Portions of this report may have been transcribed using voice recognition software.  Every effort has been made to ensure accuracy; however, inadvertent computerized transcription  errors may still be present.

## 2020-08-31 NOTE — Progress Notes (Signed)
[facility]ist of [place]  Patient name: Kevin Meyer MRN: [id number] DOB: 11/30/1970 Sex: male  REASON FOR VISIT: Evaluation of poorly functioning right IJ tunneled hemodialysis catheter  HPI: Kevin Meyer is a 50 y.o. male here today for evaluation of his catheter.  When I initially saw him he was chronic renal failure but then progressed to end-stage renal disease while hospitalized.  He underwent a tunneled hemodialysis catheter acutely with Dr. Constance Haw.  He initially had a radiocephalic fistula placed by myself but had poor maturation most likely related arterial inflow.  2 weeks ago he underwent conversion of this to a upper arm fistula.  He is now having difficulty with flow in his hemodialysis catheter and is seen today for discussion of this.  He did have adequate dialysis this morning although apparently was alarming the machine.  Current Outpatient Medications  Medication Sig Dispense Refill  . allopurinol (ZYLOPRIM) 100 MG tablet Take 100 mg by mouth daily.    Marland Kitchen aspirin EC 81 MG tablet Take 81 mg by mouth daily.    Marland Kitchen atorvastatin (LIPITOR) 40 MG tablet Take 40 mg by mouth at bedtime.    . calcium acetate (PHOSLO) 667 MG capsule Take 667 mg by mouth 3 (three) times daily with meals.    . Cholecalciferol (VITAMIN D) 50 MCG (2000 UT) CAPS Take 2,000 Units by mouth daily.    . Dulaglutide (TRULICITY) A999333 0000000 SOPN Inject 0.75 mg into the skin every Wednesday.    Marland Kitchen epoetin alfa-epbx (RETACRIT) 3000 UNIT/ML injection 3,000 Units every 14 (fourteen) days.    . folic acid (FOLVITE) 1 MG tablet Take 1 tablet (1 mg total) by mouth daily. 30 tablet 3  . glipiZIDE (GLUCOTROL) 10 MG tablet Take 1 tablet (10 mg total) by mouth 2 (two) times daily before a meal.    . multivitamin (RENA-VIT) TABS tablet Take 1 tablet by mouth daily.    Marland Kitchen oxyCODONE-acetaminophen (PERCOCET) 5-325 MG tablet Take 1 tablet by mouth every 6 (six) hours as needed for  severe pain. 8 tablet 0  . torsemide (DEMADEX) 100 MG tablet Take 1 tablet (100 mg total) by mouth every morning. 30 tablet 0  . vitamin B-12 1000 MCG tablet Take 1 tablet (1,000 mcg total) by mouth daily. 30 tablet 1   No current facility-administered medications for this visit.     PHYSICAL EXAM: Vitals:   08/31/20 1407  BP: (!) 151/74  Pulse: 100  Resp: 16  Temp: (!) 97.2 F (36.2 C)  TempSrc: Other (Comment)  SpO2: 98%  Weight: 222 lb (100.7 kg)  Height: '5\' 6"'$  (1.676 m)    GENERAL: The patient is a well-nourished male, in no acute distress. The vital signs are documented above. His left upper arm fistula has excellent Dawnyel Leven maturation with good size and very superficial.  Has an excellent thrill and his antecubital incision is healing nicely.  MEDICAL ISSUES: Patient will need another 2-1/2 months of catheter use before using his new left brachiocephalic AV fistula.  Will plan on placing a new catheter.  Discussed replacement over a wire and also possible replacing in the left IJ.  This will be done as an outpatient this coming Thursday at [facility] on 09/03/2020   Rosetta Posner, MD FACS [facility] [phone]  Note: Portions of this report may have been transcribed using voice recognition software.  Every effort has been made to ensure accuracy; however, inadvertent computerized transcription  errors may still be present.

## 2020-08-31 NOTE — Patient Instructions (Signed)
Kevin Meyer  08/31/2020     '@PREFPERIOPPHARMACY'$ @   Your procedure is scheduled on  09/03/2020   Report to [facility] at  [place]   Call this number if you have problems the morning of surgery:  [phone]   Remember:  Do not eat or drink after midnight.                         Take these medicines the morning of surgery with A SIP OF WATER allopurinol, oxycodone (if needed).  DO NOT take any medications for diabetes the morning of your procedure.  If your glucose is 70 or below the morning of your procedure, drink 1/2 cup of clear liquids containing sugar and recheck your glucose in 15 minutes. If your glucose is still 70 or below, call [phone] for instructions.  If your glucose is 300 or above the morning of your procedure, call [phone] for instructions.     Do not wear jewelry, make-up or nail polish.  Do not wear lotions, powders, or perfumes, or deodorant.  Do not shave 48 hours prior to surgery.  Men may shave face and neck.  Do not bring valuables to the hospital.  [facility] is not responsible for any belongings or valuables.  Contacts, dentures or bridgework may not be worn into surgery.  Leave your suitcase in the car.  After surgery it may be brought to your room.  For patients admitted to the hospital, discharge time will be determined by your treatment team.  Patients discharged the day of surgery will not be allowed to drive home and must have someone with them for 24 hours.   Place clean sheets on your bed the night before your procedure and DO NOT sleep with pets this night.  Shower with CHG the night before and the morning of your procedure. DO NOT use CHG on your face, hair or genitals.  After each shower, dry off with a clean towel, put on clean, comfortable clothes and brush your teeth.     Special instructions:  DO NOT smoke tobacco or vape for 24 hours before your procedure.  Please read over the following fact sheets  that you were given. Coughing and Deep Breathing, Surgical Site Infection Prevention, Anesthesia Post-op Instructions and Care and Recovery After Surgery       AV Fistula Placement, Care After The following information offers guidance on how to care for yourself after your procedure. Your health care provider may also give you more specific instructions. If you have problems or questions, contact your health care provider. What can I expect after the procedure? After the procedure, it is common to have:  Soreness at the fistula site.  Vibration (thrill) over the fistula. Follow these instructions at home: Medicines  Take over-the-counter and prescription medicines only as told by your health care provider.  Ask your health care provider if the medicine prescribed to you can cause constipation. You may need to take these actions to prevent or treat constipation: ? Drink enough fluid to keep your urine pale yellow. ? Take over-the-counter or prescription medicines. ? Eat foods that are high in fiber, such as beans, whole grains, and fresh fruits and vegetables. ? Limit foods that are high in fat and processed sugars, such as fried or sweet foods. Incision care Follow instructions from your health care provider about how to take care of your incision. Make sure you:  Wash your hands with soap and water for at least 20 seconds before and after you change your bandage (dressing). If soap and water are not available, use hand sanitizer.  Change your dressing as told by your health care provider.  Leave stitches (sutures), skin glue, or adhesive strips in place. These skin closures may need to stay in place for 2 weeks or longer. If adhesive strip edges start to loosen and curl up, you may trim the loose edges. Do not remove adhesive strips completely unless your health care provider tells you to do that.   Fistula care  Check your fistula site every day to make sure the thrill feels the  same.  Check your fistula site every day for signs of infection. Check for: ? More redness, swelling, or pain. ? Fluid or blood. ? Warmth. ? Pus or a bad smell.  Raise (elevate) the affected area above the level of your heart while you are sitting or lying down.  Do not lift anything that is heavier than 10 lb (4.5 kg), or the limit that you are told, until your health care provider says that it is safe.  Do not lie down on your fistula arm.  Do not let anyone draw blood or take a blood pressure reading on your fistula arm. This is important.  Do not wear tight jewelry or clothing over your fistula arm.   Bathing  Do not take baths, swim, or use a hot tub until your health care provider approves. Ask your health care provider if you may take showers. You may only be allowed to take sponge baths.  Keep the area around your incision clean and dry. General instructions  Rest at home for a day or two.  If you were given a sedative during the procedure, it can affect you for several hours. Do not drive or operate machinery until your health care provider says that it is safe.  Return to your normal activities as told. Ask your health care provider what activities are safe for you.  Keep all follow-up visits. This is important. Contact a health care provider if:  You have more redness, swelling, or pain around your fistula site.  Your fistula site feels warm to the touch.  You have pus or a bad smell coming from your fistula site.  You have a fever or chills.  You feel numb or cold in your arm or your fistula site.  You feel a decrease or a change in the thrill over the fistula. Get help right away if:  You have bleeding from your fistula site that will not stop.  You have chest pain.  You have trouble breathing. These symptoms may represent a serious problem that is an emergency. Do not wait to see if the symptoms will go away. Get medical help right away. Call your  local emergency services (911 in the [place] Do not drive yourself to the hospital. Summary  Follow instructions from your health care provider about how to take care of your incision.  Do not let anyone draw blood or take a blood pressure reading on your fistula arm. This is important.  Contact a health care provider if you have a change in the thrill or have any signs of infection at your fistula site.  Keep all follow-up visits. This is important. This information is not intended to replace advice given to you by your health care provider. Make sure you discuss any questions you have with your health  care provider. Document Revised: 11/20/2019 Document Reviewed: 11/20/2019 Elsevier Patient Education  [place] Anesthesia, Adult, Care After This sheet gives you information about how to care for yourself after your procedure. Your health care provider may also give you more specific instructions. If you have problems or questions, contact your health care provider. What can I expect after the procedure? After the procedure, the following side effects are common:  Pain or discomfort at the IV site.  Nausea.  Vomiting.  Sore throat.  Trouble concentrating.  Feeling cold or chills.  Feeling weak or tired.  Sleepiness and fatigue.  Soreness and body aches. These side effects can affect parts of the body that were not involved in surgery. Follow these instructions at home: For the time period you were told by your health care provider:  Rest.  Do not participate in activities where you could fall or become injured.  Do not drive or use machinery.  Do not drink alcohol.  Do not take sleeping pills or medicines that cause drowsiness.  Do not make important decisions or sign legal documents.  Do not take care of children on your own.   Eating and drinking  Follow any instructions from your health care provider about eating or drinking  restrictions.  When you feel hungry, start by eating small amounts of foods that are soft and easy to digest (bland), such as toast. Gradually return to your regular diet.  Drink enough fluid to keep your urine pale yellow.  If you vomit, rehydrate by drinking water, juice, or clear broth. General instructions  If you have sleep apnea, surgery and certain medicines can increase your risk for breathing problems. Follow instructions from your health care provider about wearing your sleep device: ? Anytime you are sleeping, including during daytime naps. ? While taking prescription pain medicines, sleeping medicines, or medicines that make you drowsy.  Have a responsible adult stay with you for the time you are told. It is important to have someone help care for you until you are awake and alert.  Return to your normal activities as told by your health care provider. Ask your health care provider what activities are safe for you.  Take over-the-counter and prescription medicines only as told by your health care provider.  If you smoke, do not smoke without supervision.  Keep all follow-up visits as told by your health care provider. This is important. Contact a health care provider if:  You have nausea or vomiting that does not get better with medicine.  You cannot eat or drink without vomiting.  You have pain that does not get better with medicine.  You are unable to pass urine.  You develop a skin rash.  You have a fever.  You have redness around your IV site that gets worse. Get help right away if:  You have difficulty breathing.  You have chest pain.  You have blood in your urine or stool, or you vomit blood. Summary  After the procedure, it is common to have a sore throat or nausea. It is also common to feel tired.  Have a responsible adult stay with you for the time you are told. It is important to have someone help care for you until you are awake and  alert.  When you feel hungry, start by eating small amounts of foods that are soft and easy to digest (bland), such as toast. Gradually return to your regular diet.  Drink enough fluid to keep your  urine pale yellow.  Return to your normal activities as told by your health care provider. Ask your health care provider what activities are safe for you. This information is not intended to replace advice given to you by your health care provider. Make sure you discuss any questions you have with your health care provider. Document Revised: 12/26/2019 Document Reviewed: 07/25/2019 Elsevier Patient Education  2021 [employer].

## 2020-09-01 ENCOUNTER — Other Ambulatory Visit (HOSPITAL_COMMUNITY)
Admission: RE | Admit: 2020-09-01 | Discharge: 2020-09-01 | Disposition: A | Discharge status: Home / Self Care | Payer: Medicaid Other | Source: Ambulatory Visit | Attending: Vascular Surgery | Admitting: Vascular Surgery

## 2020-09-01 ENCOUNTER — Encounter (HOSPITAL_COMMUNITY)
Admission: RE | Admit: 2020-09-01 | Discharge: 2020-09-01 | Disposition: A | Discharge status: Home / Self Care | Payer: Medicaid Other | Source: Ambulatory Visit | Attending: Vascular Surgery | Admitting: Vascular Surgery

## 2020-09-01 ENCOUNTER — Encounter (HOSPITAL_COMMUNITY): Payer: Self-pay

## 2020-09-01 DIAGNOSIS — Z20822 Contact with and (suspected) exposure to covid-19: Secondary | ICD-10-CM | POA: Insufficient documentation

## 2020-09-01 DIAGNOSIS — Z01812 Encounter for preprocedural laboratory examination: Secondary | ICD-10-CM | POA: Insufficient documentation

## 2020-09-01 LAB — SARS CORONAVIRUS 2 (TAT 6-24 HRS): SARS Coronavirus 2: NEGATIVE

## 2020-09-03 ENCOUNTER — Encounter (HOSPITAL_COMMUNITY): Payer: Self-pay | Admitting: Vascular Surgery

## 2020-09-03 ENCOUNTER — Encounter (HOSPITAL_COMMUNITY)
Admission: RE | Disposition: A | Discharge status: Home / Self Care | Payer: Self-pay | Source: Home / Self Care | Attending: Vascular Surgery

## 2020-09-03 ENCOUNTER — Ambulatory Visit (HOSPITAL_COMMUNITY): Payer: Medicaid Other | Admitting: Certified Registered"

## 2020-09-03 ENCOUNTER — Emergency Department (HOSPITAL_COMMUNITY)
Admission: EM | Admit: 2020-09-03 | Discharge: 2020-09-03 | Disposition: A | Discharge status: Home / Self Care | Payer: Medicaid Other | Attending: Emergency Medicine | Admitting: Emergency Medicine

## 2020-09-03 ENCOUNTER — Encounter (HOSPITAL_COMMUNITY): Payer: Self-pay | Admitting: *Deleted

## 2020-09-03 ENCOUNTER — Other Ambulatory Visit: Payer: Self-pay

## 2020-09-03 ENCOUNTER — Ambulatory Visit (HOSPITAL_COMMUNITY): Payer: Medicaid Other

## 2020-09-03 ENCOUNTER — Ambulatory Visit (HOSPITAL_COMMUNITY)
Admission: RE | Admit: 2020-09-03 | Discharge: 2020-09-03 | Disposition: A | Discharge status: Home / Self Care | Payer: Medicaid Other | Attending: Vascular Surgery | Admitting: Vascular Surgery

## 2020-09-03 DIAGNOSIS — Z79899 Other long term (current) drug therapy: Secondary | ICD-10-CM | POA: Insufficient documentation

## 2020-09-03 DIAGNOSIS — N186 End stage renal disease: Secondary | ICD-10-CM | POA: Diagnosis not present

## 2020-09-03 DIAGNOSIS — T82898A Other specified complication of vascular prosthetic devices, implants and grafts, initial encounter: Secondary | ICD-10-CM | POA: Insufficient documentation

## 2020-09-03 DIAGNOSIS — I132 Hypertensive heart and chronic kidney disease with heart failure and with stage 5 chronic kidney disease, or end stage renal disease: Secondary | ICD-10-CM | POA: Diagnosis not present

## 2020-09-03 DIAGNOSIS — T82838A Hemorrhage of vascular prosthetic devices, implants and grafts, initial encounter: Secondary | ICD-10-CM | POA: Diagnosis present

## 2020-09-03 DIAGNOSIS — Z7984 Long term (current) use of oral hypoglycemic drugs: Secondary | ICD-10-CM | POA: Insufficient documentation

## 2020-09-03 DIAGNOSIS — Z95828 Presence of other vascular implants and grafts: Secondary | ICD-10-CM

## 2020-09-03 DIAGNOSIS — I509 Heart failure, unspecified: Secondary | ICD-10-CM | POA: Insufficient documentation

## 2020-09-03 DIAGNOSIS — Z992 Dependence on renal dialysis: Secondary | ICD-10-CM | POA: Insufficient documentation

## 2020-09-03 DIAGNOSIS — E1122 Type 2 diabetes mellitus with diabetic chronic kidney disease: Secondary | ICD-10-CM | POA: Insufficient documentation

## 2020-09-03 DIAGNOSIS — Z7982 Long term (current) use of aspirin: Secondary | ICD-10-CM | POA: Insufficient documentation

## 2020-09-03 DIAGNOSIS — X58XXXA Exposure to other specified factors, initial encounter: Secondary | ICD-10-CM | POA: Insufficient documentation

## 2020-09-03 DIAGNOSIS — I130 Hypertensive heart and chronic kidney disease with heart failure and stage 1 through stage 4 chronic kidney disease, or unspecified chronic kidney disease: Secondary | ICD-10-CM | POA: Diagnosis not present

## 2020-09-03 DIAGNOSIS — N183 Chronic kidney disease, stage 3 unspecified: Secondary | ICD-10-CM | POA: Insufficient documentation

## 2020-09-03 DIAGNOSIS — Z794 Long term (current) use of insulin: Secondary | ICD-10-CM | POA: Insufficient documentation

## 2020-09-03 DIAGNOSIS — N185 Chronic kidney disease, stage 5: Secondary | ICD-10-CM | POA: Diagnosis not present

## 2020-09-03 HISTORY — PX: EXCHANGE OF A DIALYSIS CATHETER: SHX5818

## 2020-09-03 LAB — POCT I-STAT, CHEM 8
BUN: 46 mg/dL — ABNORMAL HIGH (ref 6–20)
Calcium, Ion: 1.18 mmol/L (ref 1.15–1.40)
Chloride: 99 mmol/L (ref 98–111)
Creatinine, Ser: 6 mg/dL — ABNORMAL HIGH (ref 0.61–1.24)
Glucose, Bld: 142 mg/dL — ABNORMAL HIGH (ref 70–99)
HCT: 41 % (ref 39.0–52.0)
Hemoglobin: 13.9 g/dL (ref 13.0–17.0)
Potassium: 4.2 mmol/L (ref 3.5–5.1)
Sodium: 138 mmol/L (ref 135–145)
TCO2: 28 mmol/L (ref 22–32)

## 2020-09-03 SURGERY — EXCHANGE OF A DIALYSIS CATHETER
Anesthesia: General | Site: Chest | Laterality: Right

## 2020-09-03 MED ORDER — DEXAMETHASONE SODIUM PHOSPHATE 4 MG/ML IJ SOLN
INTRAMUSCULAR | Status: DC | PRN
  Administered 2020-09-03: 4 mg via INTRAVENOUS

## 2020-09-03 MED ORDER — LIDOCAINE 2% (20 MG/ML) 5 ML SYRINGE
INTRAMUSCULAR | Status: DC | PRN
  Administered 2020-09-03: 40 mg via INTRAVENOUS

## 2020-09-03 MED ORDER — MIDAZOLAM HCL 2 MG/2ML IJ SOLN
INTRAMUSCULAR | Status: DC | PRN
  Administered 2020-09-03 (×2): 1 mg via INTRAVENOUS

## 2020-09-03 MED ORDER — FENTANYL CITRATE (PF) 100 MCG/2ML IJ SOLN
INTRAMUSCULAR | Status: AC
  Filled 2020-09-03: qty 2

## 2020-09-03 MED ORDER — 0.9 % SODIUM CHLORIDE (POUR BTL) OPTIME
TOPICAL | Status: DC | PRN
  Administered 2020-09-03: 1000 mL

## 2020-09-03 MED ORDER — PROPOFOL 10 MG/ML IV BOLUS
INTRAVENOUS | Status: AC
  Filled 2020-09-03: qty 20

## 2020-09-03 MED ORDER — DEXAMETHASONE SODIUM PHOSPHATE 10 MG/ML IJ SOLN
INTRAMUSCULAR | Status: AC
  Filled 2020-09-03: qty 1

## 2020-09-03 MED ORDER — HEPARIN SODIUM (PORCINE) 1000 UNIT/ML IJ SOLN
INTRAMUSCULAR | Status: AC
  Filled 2020-09-03: qty 6

## 2020-09-03 MED ORDER — CHLORHEXIDINE GLUCONATE 4 % EX LIQD: 60.0000 mL | Freq: Once | CUTANEOUS | Status: DC

## 2020-09-03 MED ORDER — LIDOCAINE-EPINEPHRINE 0.5 %-1:200000 IJ SOLN
INTRAMUSCULAR | Status: AC
  Filled 2020-09-03: qty 1

## 2020-09-03 MED ORDER — SODIUM CHLORIDE 0.9 % IV SOLN: INTRAVENOUS | Status: DC

## 2020-09-03 MED ORDER — LIDOCAINE-EPINEPHRINE 0.5 %-1:200000 IJ SOLN
INTRAMUSCULAR | Status: DC | PRN
  Administered 2020-09-03: 15 mL

## 2020-09-03 MED ORDER — CEFAZOLIN SODIUM-DEXTROSE 2-4 GM/100ML-% IV SOLN
2.0000 g | INTRAVENOUS | Status: AC
  Administered 2020-09-03: 2 g via INTRAVENOUS
  Filled 2020-09-03: qty 100

## 2020-09-03 MED ORDER — MIDAZOLAM HCL 2 MG/2ML IJ SOLN
INTRAMUSCULAR | Status: AC
  Filled 2020-09-03: qty 2

## 2020-09-03 MED ORDER — ONDANSETRON HCL 4 MG/2ML IJ SOLN
INTRAMUSCULAR | Status: DC | PRN
  Administered 2020-09-03: 4 mg via INTRAVENOUS

## 2020-09-03 MED ORDER — CHLORHEXIDINE GLUCONATE 0.12 % MT SOLN
15.0000 mL | Freq: Once | OROMUCOSAL | Status: AC
  Administered 2020-09-03: 15 mL via OROMUCOSAL
  Filled 2020-09-03: qty 15

## 2020-09-03 MED ORDER — HEPARIN SODIUM (PORCINE) 1000 UNIT/ML IJ SOLN
INTRAMUSCULAR | Status: DC | PRN
  Administered 2020-09-03: 3800 [IU]

## 2020-09-03 MED ORDER — LIDOCAINE HCL (PF) 2 % IJ SOLN
INTRAMUSCULAR | Status: AC
  Filled 2020-09-03: qty 5

## 2020-09-03 MED ORDER — PHENYLEPHRINE 40 MCG/ML (10ML) SYRINGE FOR IV PUSH (FOR BLOOD PRESSURE SUPPORT)
PREFILLED_SYRINGE | INTRAVENOUS | Status: AC
  Filled 2020-09-03: qty 10

## 2020-09-03 MED ORDER — ORAL CARE MOUTH RINSE: 15.0000 mL | Freq: Once | OROMUCOSAL | Status: AC

## 2020-09-03 MED ORDER — SODIUM CHLORIDE 0.9 % IV SOLN
INTRAVENOUS | Status: DC | PRN
  Administered 2020-09-03: 6 mL

## 2020-09-03 MED ORDER — FENTANYL CITRATE (PF) 100 MCG/2ML IJ SOLN: 25.0000 ug | INTRAMUSCULAR | Status: DC | PRN

## 2020-09-03 MED ORDER — FENTANYL CITRATE (PF) 100 MCG/2ML IJ SOLN
INTRAMUSCULAR | Status: DC | PRN
  Administered 2020-09-03 (×2): 50 ug via INTRAVENOUS

## 2020-09-03 MED ORDER — KETAMINE HCL 50 MG/5ML IJ SOSY
PREFILLED_SYRINGE | INTRAMUSCULAR | Status: AC
  Filled 2020-09-03: qty 5

## 2020-09-03 MED ORDER — HEPARIN SODIUM (PORCINE) 1000 UNIT/ML IJ SOLN
INTRAMUSCULAR | Status: AC
  Filled 2020-09-03: qty 4

## 2020-09-03 MED ORDER — PROPOFOL 500 MG/50ML IV EMUL
INTRAVENOUS | Status: DC | PRN
  Administered 2020-09-03: 50 ug/kg/min via INTRAVENOUS

## 2020-09-03 MED ORDER — ONDANSETRON HCL 4 MG/2ML IJ SOLN
INTRAMUSCULAR | Status: AC
  Filled 2020-09-03: qty 2

## 2020-09-03 MED ORDER — SUCCINYLCHOLINE CHLORIDE 200 MG/10ML IV SOSY
PREFILLED_SYRINGE | INTRAVENOUS | Status: AC
  Filled 2020-09-03: qty 10

## 2020-09-03 SURGICAL SUPPLY — 42 items
ADH SKN CLS APL DERMABOND .7 (GAUZE/BANDAGES/DRESSINGS) ×1
BAG DECANTER FOR FLEXI CONT (MISCELLANEOUS) ×2 IMPLANT
BAG HAMPER (MISCELLANEOUS) ×2 IMPLANT
BIOPATCH RED 1 DISK 7.0 (GAUZE/BANDAGES/DRESSINGS) ×2 IMPLANT
CATH PALINDROME-P 19CM W/VT (CATHETERS) IMPLANT
CATH PALINDROME-P 23CM W/VT (CATHETERS) ×2 IMPLANT
CATH PALINDROME-P 28CM W/VT (CATHETERS) IMPLANT
COVER LIGHT HANDLE STERIS (MISCELLANEOUS) ×4 IMPLANT
COVER PROBE W GEL 5X96 (DRAPES) IMPLANT
COVER SURGICAL LIGHT HANDLE (MISCELLANEOUS) ×2 IMPLANT
COVER WAND RF STERILE (DRAPES) ×2 IMPLANT
DECANTER SPIKE VIAL GLASS SM (MISCELLANEOUS) ×2 IMPLANT
DERMABOND ADVANCED (GAUZE/BANDAGES/DRESSINGS) ×1
DERMABOND ADVANCED .7 DNX12 (GAUZE/BANDAGES/DRESSINGS) ×1 IMPLANT
DRAPE C-ARM 42X72 X-RAY (DRAPES) ×2 IMPLANT
DRAPE CHEST BREAST 15X10 FENES (DRAPES) ×2 IMPLANT
GAUZE SPONGE 4X4 12PLY STRL (GAUZE/BANDAGES/DRESSINGS) ×2 IMPLANT
GAUZE SPONGE 4X4 16PLY XRAY LF (GAUZE/BANDAGES/DRESSINGS) ×2 IMPLANT
GLOVE SS BIOGEL STRL SZ 7.5 (GLOVE) ×1 IMPLANT
GLOVE SUPERSENSE BIOGEL SZ 7.5 (GLOVE) ×1
GLOVE SURG UNDER POLY LF SZ7 (GLOVE) ×6 IMPLANT
GOWN STRL REUS W/TWL LRG LVL3 (GOWN DISPOSABLE) ×10 IMPLANT
KIT PALINDROME-P 55CM (CATHETERS) IMPLANT
KIT TURNOVER KIT A (KITS) ×2 IMPLANT
MANIFOLD NEPTUNE II (INSTRUMENTS) ×2 IMPLANT
NEEDLE 18GX1X1/2 (RX/OR ONLY) (NEEDLE) ×2 IMPLANT
NEEDLE 22X1 1/2 (OR ONLY) (NEEDLE) ×2 IMPLANT
NEEDLE HYPO 25GX1X1/2 BEV (NEEDLE) ×2 IMPLANT
NS IRRIG 1000ML POUR BTL (IV SOLUTION) ×2 IMPLANT
PACK SURGICAL SETUP 50X90 (CUSTOM PROCEDURE TRAY) ×2 IMPLANT
PAD ARMBOARD 7.5X6 YLW CONV (MISCELLANEOUS) ×4 IMPLANT
POSITIONER HEAD DONUT 9IN (MISCELLANEOUS) ×2 IMPLANT
SET BASIN LINEN APH (SET/KITS/TRAYS/PACK) ×2 IMPLANT
SOAP 2 % CHG 4 OZ (WOUND CARE) ×2 IMPLANT
SUT ETHILON 3 0 PS 1 (SUTURE) ×2 IMPLANT
SUT VICRYL 4-0 PS2 18IN ABS (SUTURE) ×2 IMPLANT
SYR 10ML LL (SYRINGE) ×2 IMPLANT
SYR 20ML LL LF (SYRINGE) ×2 IMPLANT
SYR 5ML LL (SYRINGE) ×4 IMPLANT
SYR CONTROL 10ML LL (SYRINGE) ×2 IMPLANT
TOWEL GREEN STERILE (TOWEL DISPOSABLE) ×2 IMPLANT
WATER STERILE IRR 1000ML POUR (IV SOLUTION) ×2 IMPLANT

## 2020-09-03 NOTE — Op Note (Signed)
    OPERATIVE REPORT  DATE OF SURGERY: 09/03/2020  PATIENT: Kevin Meyer, 50 y.o. male MRN: [id number]  DOB: 03/03/1971  PRE-OPERATIVE DIAGNOSIS: End-stage renal disease with poorly functioning right IJ tunneled hemodialysis catheter  POST-OPERATIVE DIAGNOSIS:  Same  PROCEDURE: Exchange of right IJ tunneled hemodialysis catheter  SURGEON:  Curt Jews, M.D.  PHYSICIAN ASSISTANT: Nurse  The assistant was needed for exposure and to expedite the case  ANESTHESIA: Local with sedation  EBL: per anesthesia record  Total I/O In: 100 [IV Piggyback:100] Out: -   BLOOD ADMINISTERED: none  DRAINS: none  SPECIMEN: none  COUNTS CORRECT:  YES  PATIENT DISPOSITION:  PACU - hemodynamically stable  PROCEDURE DETAILS: Patient was taken operating placed supine position where the area of the right and left neck and chest were prepped and draped you sterile fashion.  The existing catheter was prepped into the field.  Using local anesthesia, incision was made at the base of the neck over the entry site into the internal jugular vein.  The catheter was secured with a hemostat and was transected distally.  A guidewire was passed through the existing catheter and the catheter was removed.  A new 23 cm hemodialysis catheter was brought onto the field.  The dilator and peel-away sheath was passed over the guidewire and the guidewire and dilator were removed.  The catheter was positioned through the peel-away sheath which was removed.  The catheter was positioned with the tips in the distal right atrium.  Catheter was brought through a subcutaneous tunnel through a separate stab incision.  The catheter but secured to the skin with a 3-0 nylon simple stitch and the entry site was closed with a 4-0 subcuticular Vicryl stitch.  The catheter flushed and aspirated easily with heparinized saline.  The catheter was locked with 1000/cc heparin.  The old catheter was removed by freeing up the Dacron cuff.  Sterile  dressing was applied and the patient was transferred to the recovery room where chest x-ray is pending   Rosetta Posner, M.D., [facility] 09/03/2020 10:44 AM  Note: Portions of this report may have been transcribed using voice recognition software.  Every effort has been made to ensure accuracy; however, inadvertent computerized transcription errors may still be present.

## 2020-09-03 NOTE — Anesthesia Procedure Notes (Signed)
Date/Time: 09/03/2020 10:06 AM Performed by: Orlie Dakin, CRNA Pre-anesthesia Checklist: Patient identified, Emergency Drugs available, Suction available and Patient being monitored Patient Re-evaluated:Patient Re-evaluated prior to induction Oxygen Delivery Method: Non-rebreather mask Induction Type: IV induction Placement Confirmation: positive ETCO2

## 2020-09-03 NOTE — ED Notes (Signed)
Provider in with patient, cleaning and changing dialysis port dressing.

## 2020-09-03 NOTE — Transfer of Care (Signed)
Immediate Anesthesia Transfer of Care Note  Patient: Kevin Meyer  Procedure(s) Performed: EXCHANGE OF A TUNNELED DIALYSIS CATHETER (Right Chest)  Patient Location: PACU  Anesthesia Type:General  Level of Consciousness: awake, alert  and oriented  Airway & Oxygen Therapy: Patient Spontanous Breathing  Post-op Assessment: Report given to RN and Post -op Vital signs reviewed and stable  Post vital signs: Reviewed and stable  Last Vitals:  Vitals Value Taken Time  BP 113/51 09/03/20 1042  Temp    Pulse 87 09/03/20 1044  Resp 17 09/03/20 1044  SpO2 97 % 09/03/20 1044  Vitals shown include unvalidated device data.  Last Pain:  Vitals:   09/03/20 0725  TempSrc: Oral  PainSc: 0-No pain         Complications: No complications documented.

## 2020-09-03 NOTE — Progress Notes (Signed)
Dialysis fax sheet sent to [place] office [phone] location confirmed by phone with staff verifying patient name and dob

## 2020-09-03 NOTE — Discharge Instructions (Addendum)
Wound check tomorrow at dialysis. Return to the ER for bleeding not controlled with pressure at home or any other concerns.

## 2020-09-03 NOTE — ED Provider Notes (Signed)
Pt presents after having some increased bleeding from the dialysis access site in the R upper chest - changed today - fresh dressing - has some blood - no heavy bleeding, biopatch soaked - changed sterily, pt is in need of obs for a couple of hours - make sure no return of bleeding. Pt agreeable.   Noemi Chapel, MD 09/03/20 [phone]

## 2020-09-03 NOTE — Anesthesia Preprocedure Evaluation (Signed)
Anesthesia Evaluation  Patient identified by MRN, date of birth, ID band Patient awake    Reviewed: Allergy & Precautions, NPO status , Patient's Chart, lab work & pertinent test results  History of Anesthesia Complications Negative for: history of anesthetic complications  Airway Mallampati: III  TM Distance: >3 FB Neck ROM: Full    Dental  (+) Dental Advisory Given, Missing   Pulmonary sleep apnea ,    Pulmonary exam normal breath sounds clear to auscultation       Cardiovascular Exercise Tolerance: Poor hypertension, Pt. on medications and Pt. on home beta blockers +CHF  Normal cardiovascular exam Rhythm:Regular Rate:Normal  1. Left ventricular ejection fraction, by estimation, is 50%. The left  ventricle has low normal function. The left ventricle has no regional wall  motion abnormalities. There is mild left ventricular hypertrophy. Left  ventricular diastolic parameters are  indeterminate.  2. Right ventricular systolic function is normal. The right ventricular  size is normal.  3. Left atrial size was mildly dilated.  4. A small pericardial effusion is present. The pericardial effusion is  circumferential.  5. The mitral valve is normal in structure. No evidence of mitral valve  regurgitation. No evidence of mitral stenosis.  6. The aortic valve is tricuspid. There is mild calcification of the  aortic valve. There is mild thickening of the aortic valve. Aortic valve  regurgitation is not visualized. No aortic stenosis is present.  7. The inferior vena cava is normal in size with greater than 50%  respiratory variability, suggesting right atrial pressure of 3 mmHg.   22-Jun-2020 20:47:25 [place] [facility]-NLD ROUTINE RECORD Sinus tachycardia Borderline repolarization abnormality T wave abnormality Abnormal ECG Confirmed by Carmin Muskrat [phone]) on 06/22/2020 9:07:56 PM   Neuro/Psych negative  neurological ROS  negative psych ROS   GI/Hepatic GERD  Controlled,  Endo/Other  diabetes, Well Controlled, Type 2, Oral Hypoglycemic Agents  Renal/GU ESRFRenal disease     Musculoskeletal  (+) Arthritis ,   Abdominal   Peds  Hematology  (+) Blood dyscrasia, anemia ,   Anesthesia Other Findings   Reproductive/Obstetrics                             Anesthesia Physical  Anesthesia Plan  ASA: III  Anesthesia Plan: General   Post-op Pain Management:    Induction: Intravenous  PONV Risk Score and Plan: 3 and Ondansetron  Airway Management Planned: Nasal Cannula, Natural Airway and Simple Face Mask  Additional Equipment:   Intra-op Plan:   Post-operative Plan:   Informed Consent: I have reviewed the patients History and Physical, chart, labs and discussed the procedure including the risks, benefits and alternatives for the proposed anesthesia with the patient or authorized representative who has indicated his/her understanding and acceptance.       Plan Discussed with: CRNA and Surgeon  Anesthesia Plan Comments:         Anesthesia Quick Evaluation

## 2020-09-03 NOTE — Anesthesia Postprocedure Evaluation (Signed)
Anesthesia Post Note  Patient: Kevin Meyer  Procedure(s) Performed: EXCHANGE OF A TUNNELED DIALYSIS CATHETER (Right Chest)  Patient location during evaluation: PACU Anesthesia Type: General Level of consciousness: awake and alert and oriented Pain management: pain level controlled Vital Signs Assessment: post-procedure vital signs reviewed and stable Respiratory status: spontaneous breathing and respiratory function stable Cardiovascular status: blood pressure returned to baseline and stable Postop Assessment: no apparent nausea or vomiting Anesthetic complications: no   No complications documented.   Last Vitals:  Vitals:   09/03/20 0725 09/03/20 1045  BP: (!) 151/59 (!) 114/57  Pulse: 89 87  Resp: (!) 21 17  Temp: 37.3 C 36.9 C  SpO2: 100% 97%    Last Pain:  Vitals:   09/03/20 1045  TempSrc:   PainSc: 0-No pain                 Marcianne Ozbun C Kailyn Vanderslice

## 2020-09-03 NOTE — Interval H&P Note (Signed)
History and Physical Interval Note:  09/03/2020 7:26 AM  Kevin Meyer  has presented today for surgery, with the diagnosis of ESRD.  The various methods of treatment have been discussed with the patient and family. After consideration of risks, benefits and other options for treatment, the patient has consented to  Procedure(s): EXCHANGE OF A TUNNELED DIALYSIS CATHETER (N/A) as a surgical intervention.  The patient's history has been reviewed, patient examined, no change in status, stable for surgery.  I have reviewed the patient's chart and labs.  Questions were answered to the patient's satisfaction.     Curt Jews

## 2020-09-03 NOTE — ED Provider Notes (Signed)
[facility] Provider Note   CSN: [id number] Arrival date & time: 09/03/20  1615     History Chief Complaint  Patient presents with  . Vascular Access Problem    Kevin Meyer is a 50 y.o. male.  50 year old male with past medical history of DM, CKD (on dialysis), CHF presents with complaint of bleeding from dialysis catheter site, had catheter exchanged at this hospital this morning. Patient reports some bleeding in recovery that was marked on the bandage for monitoring and was dc home. Patient woke up from a nap today, noticed blood on his flank, realized he was bleeding from his neck wound that was glued earlier today, then noticed blood from his dressing and came to the ER. No bleeding at this time.         Past Medical History:  Diagnosis Date  . Anemia   . Diabetes mellitus without complication ([place])   . Gout   . High cholesterol   . Hypertension   . Osteoarthritis   . Renal disorder     Patient Active Problem List   Diagnosis Date Noted  . CHF (congestive heart failure) ([place]) 06/22/2020  . Elevated brain natriuretic peptide (BNP) level 06/22/2020  . Normocytic anemia 06/22/2020  . Hyperglycemia due to diabetes mellitus ([place]) 06/22/2020  . Osteoarthritis 10/10/2017  . Stage 3 chronic kidney disease ([place]) 10/10/2017  . Type 2 diabetes with nephropathy ([place])   . Acute kidney injury superimposed on CKD ([place])   . Class 2 obesity due to excess calories with body mass index (BMI) of 39.0 to 39.9 in adult   . Hyperlipidemia   . Hyperkalemia 08/25/2017  . Renal disorder   . Hypertension   . AKI (acute kidney injury) [facility])     Past Surgical History:  Procedure Laterality Date  . AV FISTULA PLACEMENT Left 02/20/2020   Procedure: LEFT ARM ARTERIOVENOUS (AV) FISTULA CREATION;  Surgeon: Rosetta Posner, MD;  Location: [facility];  Service: Vascular;  Laterality: Left;  . AV FISTULA PLACEMENT Left 08/20/2020   Procedure: LEFT ARM BRACHIAL CEPHALIC  ARTERIOVENOUS (AV) FISTULA CREATION;  Surgeon: Rosetta Posner, MD;  Location: [facility];  Service: Vascular;  Laterality: Left;  . INSERTION OF DIALYSIS CATHETER Right 06/24/2020   Procedure: INSERTION OF DIALYSIS CATHETER;  Surgeon: Virl Cagey, MD;  Location: [facility];  Service: General;  Laterality: Right;  . LIGATION OF ARTERIOVENOUS  FISTULA Left 08/20/2020   Procedure: LIGATION OF  LEFT RADIOCEPHALIC ARTERIOVENOUS  FISTULA;  Surgeon: Rosetta Posner, MD;  Location: [facility];  Service: Vascular;  Laterality: Left;  . NO PAST SURGERIES         Family History  Problem Relation Age of Onset  . Stroke Mother   . Diabetes Other   . Hypertension Father     Social History   Tobacco Use  . Smoking status: Never Smoker  . Smokeless tobacco: Never Used  Vaping Use  . Vaping Use: Never used  Substance Use Topics  . Alcohol use: Never    Comment: occasional  . Drug use: Never    Home Medications Prior to Admission medications   Medication Sig Start Date End Date Taking? Authorizing Provider  acetaminophen (TYLENOL) 500 MG tablet Take 1,000 mg by mouth every 6 (six) hours as needed for moderate pain or headache.    [provider]  allopurinol (ZYLOPRIM) 100 MG tablet Take 100 mg by mouth daily.    [provider]  aspirin  EC 81 MG tablet Take 81 mg by mouth daily.    [provider]  atorvastatin (LIPITOR) 40 MG tablet Take 40 mg by mouth at bedtime.    [provider]  calcium acetate (PHOSLO) 667 MG capsule Take 1,334 mg by mouth 3 (three) times daily with meals. 06/10/20   [provider]  Cholecalciferol (VITAMIN D) 50 MCG (2000 UT) CAPS Take 2,000 Units by mouth daily.    [provider]  Dulaglutide (TRULICITY) 1.61 WR/6.0AV SOPN Inject 0.75 mg into the skin every Thursday.    [provider]  epoetin alfa-epbx (RETACRIT) 3000 UNIT/ML injection 3,000 Units every 14 (fourteen) days. 06/15/20   Bhutani, Lavina Hamman, MD  folic  acid (FOLVITE) 1 MG tablet Take 1 tablet (1 mg total) by mouth daily. 06/29/20 06/29/21  Kathie Dike, MD  glipiZIDE (GLUCOTROL) 10 MG tablet Take 1 tablet (10 mg total) by mouth 2 (two) times daily before a meal. 08/26/17   Barton Dubois, MD  multivitamin (RENA-VIT) TABS tablet Take 1 tablet by mouth daily.    [provider]  vitamin B-12 1000 MCG tablet Take 1 tablet (1,000 mcg total) by mouth daily. 06/30/20   Kathie Dike, MD    Allergies    Gabapentin and Lyrica [pregabalin]  Review of Systems   Review of Systems  Constitutional: Negative for fever.  Respiratory: Negative for shortness of breath.   Cardiovascular: Negative for chest pain.  Gastrointestinal: Negative for nausea and vomiting.  Skin: Positive for wound.    Physical Exam Updated Vital Signs BP (!) 155/68   Pulse 98   Temp 99.3 F (37.4 C) (Oral)   Resp 20   Ht _0  (1.676 m)   Wt 100.7 kg   SpO2 99%   BMI 35.83 kg/m   Physical Exam Vitals and nursing note reviewed.  Constitutional:      General: He is not in acute distress.    Appearance: He is well-developed. He is not diaphoretic.  HENT:     Head: Normocephalic and atraumatic.  Pulmonary:     Effort: Pulmonary effort is normal.  Skin:    General: Skin is warm and dry.     Findings: No erythema or rash.       Neurological:     Mental Status: He is alert and oriented to person, place, and time.  Psychiatric:        Behavior: Behavior normal.     ED Results / Procedures / Treatments   Labs (all labs ordered are listed, but only abnormal results are displayed) Labs Reviewed - No data to display  EKG None  Radiology DG Chest [facility] 1 View  Result Date: 09/03/2020 CLINICAL DATA:  Met dialysis catheter placement EXAM: PORTABLE CHEST 1 VIEW COMPARISON:  06/24/2020 FINDINGS: Right IJ dialysis catheter with tip at the upper cavoatrial junction. No mediastinal widening, effusion, or pneumothorax. Borderline vascular congestion. Stable  heart size. IMPRESSION: New dialysis catheter without complicating features. Electronically Signed   By: Monte Fantasia M.D.   On: 09/03/2020 11:41   DG C-Arm 1-60 Min-No Report  Result Date: 09/03/2020 Fluoroscopy was utilized by the requesting physician.  No radiographic interpretation.    Procedures Procedures   Medications Ordered in ED Medications - No data to display  ED Course  I have reviewed the triage vital signs and the nursing notes.  Pertinent labs & imaging results that were available during my care of the patient were reviewed by me and considered in my  medical decision making (see chart for details).  Clinical Course as of 09/03/20 1814  Thu May 12, 304  6337 50 year old male presents with complaint of bleeding from his surgical site today from dialysis catheter exchange.  Bleeding has resolved.  Denies other complaints at this time. On exam, patient has blood on his dressing, no active bleeding. Vitals reviewed, he is mildly tachycardic with a heart rate of 103, will plan to recheck.  O2 sat 99% on room air. Op note from today surgery reviewed, no complications. [LM]  1726 Dressing was removed, cleaned with sterile technique and new dressing applied.  Steri-Strip applied to small wound inferior to his catheter. [LM]  5300 No additional bleeding, dressing clean, dry, intact.  [LM]  5110 No further bleeding, plan is for discharge with recheck tomorrow dialysis. [LM]    Clinical Course User Index [LM] Roque Lias   MDM Rules/Calculators/A&P                          Final Clinical Impression(s) / ED Diagnoses Final diagnoses:  Bleeding due to dialysis catheter placement, initial encounter [facility])    Rx / DC Orders ED Discharge Orders    None       Roque Lias 09/03/20 1814    Noemi Chapel, MD 09/03/20 [id number]

## 2020-09-03 NOTE — Discharge Instructions (Signed)
Tunneled Catheter Insertion, Care After This sheet gives you information about how to care for yourself after your procedure. Your health care provider may also give you more specific instructions. If you have problems or questions, contact your health care provider. What can I expect after the procedure? After the procedure, it is common to have:  Some mild redness, bruising, swelling, and pain around your catheter site.  A small amount of blood or clear fluid coming from your incisions. Follow these instructions at home: Incision care  Follow instructions from your health care provider about how to take care of your incisions. Make sure you: ? Wash your hands with soap and water before and after you change your bandages (dressings). If soap and water are not available, use hand sanitizer. ? Change your dressings as told by your health care provider. Wash the area around your incisions with a germ-killing (antiseptic) solution when you change your dressings. ? Leave stitches (sutures), skin glue, or adhesive strips in place. These skin closures may need to stay in place for 2 weeks or longer. If adhesive strip edges start to loosen and curl up, you may trim the loose edges. Do not remove adhesive strips completely unless your health care provider tells you to do that.  Keep your dressings clean and dry.  Check your incision areas every day for signs of infection. Check for: ? More redness, swelling, or pain. ? More fluid or blood. ? Warmth. ? Pus or a bad smell.   Catheter care  Wash your hands with soap and water before and after caring for your catheter. If soap and water are not available, use hand sanitizer.  Keep your catheter site clean and dry.  Apply an antibiotic ointment to your catheter site as told by your health care provider.  Flush your catheter as told by your health care provider. This helps prevent it from becoming clogged.  Do not open the caps on the ends of the  catheter.  Do not pull on your catheter.   Medicines  Take over-the-counter and prescription medicines only as told by your health care provider.  If you were prescribed an antibiotic medicine, take it as told by your health care provider. Do not stop taking the antibiotic even if you start to feel better. Activity  Return to your normal activities as told by your health care provider. Ask your health care provider what activities are safe for you.  Follow any other activity restrictions as instructed by your health care provider.  Do not lift anything that is heavier than 10 lb (4.5 kg), or the limit that you are told, until your health care provider says that it is safe. Driving  Do not drive until your health care provider approves.  Ask your health care provider if the medicine prescribed to you requires you to avoid driving or using heavy machinery. General instructions  Follow your health care provider's specific instructions for the type of catheter that you have.  Do not take baths, swim, or use a hot tub until your health care provider approves. Ask your health care provider if you may take showers.  Keep all follow-up visits as told by your health care provider. This is important. Contact a health care provider if:  You feel unusually weak or nauseous.  You have more redness, swelling, or pain at your incisions or around the area where your catheter is inserted.  Your catheter is not working properly.  You are unable to flush  your catheter. Get help right away if:  Your catheter develops a hole or it breaks.  You have pain or swelling when fluids or medicines are being given through the catheter.  Fluid is leaking from the catheter, under the dressing, or around the dressing.  Your catheter comes loose or gets pulled completely out. If this happens, press on your catheter site firmly with a clean cloth until you can get medical help.  You have swelling in your  shoulder, neck, chest, or face.  You have chest pain or difficulty breathing.  You feel dizzy or light-headed.  You have pus or a bad smell coming from your catheter site.  You have a fever or chills.  Your catheter site feels warm to the touch.  You develop bleeding from your catheter or your insertion site, and your bleeding does not stop. Summary  After the procedure, it is common to have mild redness, swelling, and pain around your catheter site.  Return to your normal activities as told by your health care provider. Ask your health care provider what activities are safe for you.  Follow your health care provider's specific instructions for the type of catheter that you have.  Keep your catheter site and your dressings clean and dry.  Contact a health care provider if your catheter is not working properly. Get help right away if you have chest pain, fever, or difficulty breathing. This information is not intended to replace advice given to you by your health care provider. Make sure you discuss any questions you have with your health care provider. Document Revised: 04/03/2018 Document Reviewed: 04/03/2018 Elsevier Patient Education  2021 [name] After This sheet gives you information about how to care for yourself after your procedure. Your health care provider may also give you more specific instructions. If you have problems or questions, contact your health care provider. What can I expect after the procedure? After the procedure, it is common to have:  Tiredness.  Forgetfulness about what happened after the procedure.  Impaired judgment for important decisions.  Nausea or vomiting.  Some difficulty with balance. Follow these instructions at home: For the time period you were told by your health care provider:  Rest as needed.  Do not participate in activities where you could fall or become injured.  Do not drive or  use machinery.  Do not drink alcohol.  Do not take sleeping pills or medicines that cause drowsiness.  Do not make important decisions or sign legal documents.  Do not take care of children on your own.      Eating and drinking  Follow the diet that is recommended by your health care provider.  Drink enough fluid to keep your urine pale yellow.  If you vomit: ? Drink water, juice, or soup when you can drink without vomiting. ? Make sure you have little or no nausea before eating solid foods. General instructions  Have a responsible adult stay with you for the time you are told. It is important to have someone help care for you until you are awake and alert.  Take over-the-counter and prescription medicines only as told by your health care provider.  If you have sleep apnea, surgery and certain medicines can increase your risk for breathing problems. Follow instructions from your health care provider about wearing your sleep device: ? Anytime you are sleeping, including during daytime naps. ? While taking prescription pain medicines, sleeping medicines, or medicines that  make you drowsy.  Avoid smoking.  Keep all follow-up visits as told by your health care provider. This is important. Contact a health care provider if:  You keep feeling nauseous or you keep vomiting.  You feel light-headed.  You are still sleepy or having trouble with balance after 24 hours.  You develop a rash.  You have a fever.  You have redness or swelling around the IV site. Get help right away if:  You have trouble breathing.  You have new-onset confusion at home. Summary  For several hours after your procedure, you may feel tired. You may also be forgetful and have poor judgment.  Have a responsible adult stay with you for the time you are told. It is important to have someone help care for you until you are awake and alert.  Rest as told. Do not drive or operate machinery. Do not drink  alcohol or take sleeping pills.  Get help right away if you have trouble breathing, or if you suddenly become confused. This information is not intended to replace advice given to you by your health care provider. Make sure you discuss any questions you have with your health care provider. Document Revised: 12/26/2019 Document Reviewed: 03/14/2019 Elsevier Patient Education  2021 [employer].

## 2020-09-03 NOTE — ED Triage Notes (Signed)
Dialysis catheter replaced in right side of chest today. Bleeding around site

## 2020-09-04 ENCOUNTER — Encounter (HOSPITAL_COMMUNITY): Payer: Self-pay | Admitting: Vascular Surgery

## 2020-09-04 LAB — GLUCOSE, CAPILLARY: Glucose-Capillary: 156 mg/dL — ABNORMAL HIGH (ref 70–99)

## 2020-09-28 ENCOUNTER — Encounter: Payer: Medicaid Other | Admitting: Vascular Surgery

## 2020-12-11 ENCOUNTER — Telehealth: Payer: Self-pay

## 2020-12-11 NOTE — Telephone Encounter (Signed)
Kevin Meyer from [facility] calls today to report that two of patient's fingers on the AVF arm are cold. Offered an appt for evaluation, patient refuses any appointment not in [place]. Aware that Dr. Donnetta Hutching is out of office until 12/23/2020. They will call back if patient changes his mind.

## 2020-12-24 ENCOUNTER — Encounter (HOSPITAL_COMMUNITY): Payer: Self-pay

## 2020-12-24 ENCOUNTER — Other Ambulatory Visit: Payer: Self-pay

## 2020-12-24 ENCOUNTER — Emergency Department (HOSPITAL_COMMUNITY): Payer: Medicaid Other

## 2020-12-24 ENCOUNTER — Inpatient Hospital Stay (HOSPITAL_COMMUNITY)
Admission: EM | Admit: 2020-12-24 | Discharge: 2021-01-05 | DRG: 853 | Disposition: A | Discharge status: Home / Self Care | Payer: Medicaid Other | Attending: Internal Medicine | Admitting: Internal Medicine

## 2020-12-24 DIAGNOSIS — L03012 Cellulitis of left finger: Secondary | ICD-10-CM | POA: Diagnosis present

## 2020-12-24 DIAGNOSIS — D62 Acute posthemorrhagic anemia: Secondary | ICD-10-CM | POA: Diagnosis present

## 2020-12-24 DIAGNOSIS — M109 Gout, unspecified: Secondary | ICD-10-CM | POA: Diagnosis present

## 2020-12-24 DIAGNOSIS — E876 Hypokalemia: Secondary | ICD-10-CM | POA: Diagnosis present

## 2020-12-24 DIAGNOSIS — Z6839 Body mass index (BMI) 39.0-39.9, adult: Secondary | ICD-10-CM

## 2020-12-24 DIAGNOSIS — I5022 Chronic systolic (congestive) heart failure: Secondary | ICD-10-CM | POA: Diagnosis present

## 2020-12-24 DIAGNOSIS — I10 Essential (primary) hypertension: Secondary | ICD-10-CM | POA: Diagnosis present

## 2020-12-24 DIAGNOSIS — R569 Unspecified convulsions: Secondary | ICD-10-CM | POA: Diagnosis present

## 2020-12-24 DIAGNOSIS — Z7984 Long term (current) use of oral hypoglycemic drugs: Secondary | ICD-10-CM

## 2020-12-24 DIAGNOSIS — Z79899 Other long term (current) drug therapy: Secondary | ICD-10-CM

## 2020-12-24 DIAGNOSIS — E1122 Type 2 diabetes mellitus with diabetic chronic kidney disease: Secondary | ICD-10-CM | POA: Diagnosis present

## 2020-12-24 DIAGNOSIS — I132 Hypertensive heart and chronic kidney disease with heart failure and with stage 5 chronic kidney disease, or end stage renal disease: Secondary | ICD-10-CM | POA: Diagnosis present

## 2020-12-24 DIAGNOSIS — Y848 Other medical procedures as the cause of abnormal reaction of the patient, or of later complication, without mention of misadventure at the time of the procedure: Secondary | ICD-10-CM | POA: Diagnosis present

## 2020-12-24 DIAGNOSIS — S61402A Unspecified open wound of left hand, initial encounter: Secondary | ICD-10-CM

## 2020-12-24 DIAGNOSIS — Z833 Family history of diabetes mellitus: Secondary | ICD-10-CM

## 2020-12-24 DIAGNOSIS — R6889 Other general symptoms and signs: Secondary | ICD-10-CM

## 2020-12-24 DIAGNOSIS — Z20822 Contact with and (suspected) exposure to covid-19: Secondary | ICD-10-CM | POA: Diagnosis present

## 2020-12-24 DIAGNOSIS — T80211A Bloodstream infection due to central venous catheter, initial encounter: Secondary | ICD-10-CM | POA: Diagnosis present

## 2020-12-24 DIAGNOSIS — I96 Gangrene, not elsewhere classified: Secondary | ICD-10-CM

## 2020-12-24 DIAGNOSIS — E6609 Other obesity due to excess calories: Secondary | ICD-10-CM | POA: Diagnosis present

## 2020-12-24 DIAGNOSIS — E669 Obesity, unspecified: Secondary | ICD-10-CM | POA: Diagnosis not present

## 2020-12-24 DIAGNOSIS — I9589 Other hypotension: Secondary | ICD-10-CM | POA: Diagnosis present

## 2020-12-24 DIAGNOSIS — N186 End stage renal disease: Secondary | ICD-10-CM | POA: Diagnosis present

## 2020-12-24 DIAGNOSIS — I998 Other disorder of circulatory system: Secondary | ICD-10-CM | POA: Diagnosis present

## 2020-12-24 DIAGNOSIS — Z95828 Presence of other vascular implants and grafts: Secondary | ICD-10-CM

## 2020-12-24 DIAGNOSIS — Z8249 Family history of ischemic heart disease and other diseases of the circulatory system: Secondary | ICD-10-CM | POA: Diagnosis not present

## 2020-12-24 DIAGNOSIS — T82898A Other specified complication of vascular prosthetic devices, implants and grafts, initial encounter: Secondary | ICD-10-CM | POA: Diagnosis present

## 2020-12-24 DIAGNOSIS — Z7982 Long term (current) use of aspirin: Secondary | ICD-10-CM

## 2020-12-24 DIAGNOSIS — R509 Fever, unspecified: Secondary | ICD-10-CM | POA: Diagnosis present

## 2020-12-24 DIAGNOSIS — A498 Other bacterial infections of unspecified site: Secondary | ICD-10-CM | POA: Diagnosis not present

## 2020-12-24 DIAGNOSIS — L089 Local infection of the skin and subcutaneous tissue, unspecified: Secondary | ICD-10-CM | POA: Diagnosis present

## 2020-12-24 DIAGNOSIS — Z888 Allergy status to other drugs, medicaments and biological substances status: Secondary | ICD-10-CM

## 2020-12-24 DIAGNOSIS — T827XXA Infection and inflammatory reaction due to other cardiac and vascular devices, implants and grafts, initial encounter: Secondary | ICD-10-CM | POA: Diagnosis not present

## 2020-12-24 DIAGNOSIS — B9689 Other specified bacterial agents as the cause of diseases classified elsewhere: Secondary | ICD-10-CM | POA: Diagnosis present

## 2020-12-24 DIAGNOSIS — T82590A Other mechanical complication of surgically created arteriovenous fistula, initial encounter: Secondary | ICD-10-CM | POA: Diagnosis not present

## 2020-12-24 DIAGNOSIS — E875 Hyperkalemia: Secondary | ICD-10-CM | POA: Diagnosis present

## 2020-12-24 DIAGNOSIS — R000 Tachycardia, unspecified: Secondary | ICD-10-CM | POA: Diagnosis not present

## 2020-12-24 DIAGNOSIS — E7800 Pure hypercholesterolemia, unspecified: Secondary | ICD-10-CM

## 2020-12-24 DIAGNOSIS — Z992 Dependence on renal dialysis: Secondary | ICD-10-CM

## 2020-12-24 DIAGNOSIS — M1990 Unspecified osteoarthritis, unspecified site: Secondary | ICD-10-CM | POA: Diagnosis present

## 2020-12-24 DIAGNOSIS — D631 Anemia in chronic kidney disease: Secondary | ICD-10-CM | POA: Diagnosis present

## 2020-12-24 DIAGNOSIS — L98499 Non-pressure chronic ulcer of skin of other sites with unspecified severity: Secondary | ICD-10-CM | POA: Diagnosis not present

## 2020-12-24 DIAGNOSIS — E118 Type 2 diabetes mellitus with unspecified complications: Secondary | ICD-10-CM | POA: Diagnosis not present

## 2020-12-24 DIAGNOSIS — Z01810 Encounter for preprocedural cardiovascular examination: Secondary | ICD-10-CM | POA: Diagnosis not present

## 2020-12-24 DIAGNOSIS — Z23 Encounter for immunization: Secondary | ICD-10-CM

## 2020-12-24 DIAGNOSIS — Y832 Surgical operation with anastomosis, bypass or graft as the cause of abnormal reaction of the patient, or of later complication, without mention of misadventure at the time of the procedure: Secondary | ICD-10-CM | POA: Diagnosis present

## 2020-12-24 DIAGNOSIS — E1121 Type 2 diabetes mellitus with diabetic nephropathy: Secondary | ICD-10-CM | POA: Diagnosis present

## 2020-12-24 DIAGNOSIS — E785 Hyperlipidemia, unspecified: Secondary | ICD-10-CM | POA: Diagnosis present

## 2020-12-24 DIAGNOSIS — A4159 Other Gram-negative sepsis: Principal | ICD-10-CM | POA: Diagnosis present

## 2020-12-24 DIAGNOSIS — N2581 Secondary hyperparathyroidism of renal origin: Secondary | ICD-10-CM | POA: Diagnosis present

## 2020-12-24 DIAGNOSIS — L0390 Cellulitis, unspecified: Secondary | ICD-10-CM | POA: Diagnosis not present

## 2020-12-24 LAB — CBC WITH DIFFERENTIAL/PLATELET
Abs Immature Granulocytes: 0.03 10*3/uL (ref 0.00–0.07)
Basophils Absolute: 0 10*3/uL (ref 0.0–0.1)
Basophils Relative: 0 %
Eosinophils Absolute: 0.1 10*3/uL (ref 0.0–0.5)
Eosinophils Relative: 1 %
HCT: 35 % — ABNORMAL LOW (ref 39.0–52.0)
Hemoglobin: 11.3 g/dL — ABNORMAL LOW (ref 13.0–17.0)
Immature Granulocytes: 1 %
Lymphocytes Relative: 21 %
Lymphs Abs: 1.1 10*3/uL (ref 0.7–4.0)
MCH: 30.1 pg (ref 26.0–34.0)
MCHC: 32.3 g/dL (ref 30.0–36.0)
MCV: 93.3 fL (ref 80.0–100.0)
Monocytes Absolute: 0.7 10*3/uL (ref 0.1–1.0)
Monocytes Relative: 14 %
Neutro Abs: 3.5 10*3/uL (ref 1.7–7.7)
Neutrophils Relative %: 63 %
Platelets: 126 10*3/uL — ABNORMAL LOW (ref 150–400)
RBC: 3.75 MIL/uL — ABNORMAL LOW (ref 4.22–5.81)
RDW: 16 % — ABNORMAL HIGH (ref 11.5–15.5)
WBC: 5.5 10*3/uL (ref 4.0–10.5)
nRBC: 0 % (ref 0.0–0.2)

## 2020-12-24 LAB — COMPREHENSIVE METABOLIC PANEL
ALT: 45 U/L — ABNORMAL HIGH (ref 0–44)
AST: 54 U/L — ABNORMAL HIGH (ref 15–41)
Albumin: 3.7 g/dL (ref 3.5–5.0)
Alkaline Phosphatase: 58 U/L (ref 38–126)
Anion gap: 12 (ref 5–15)
BUN: 46 mg/dL — ABNORMAL HIGH (ref 6–20)
CO2: 27 mmol/L (ref 22–32)
Calcium: 8.6 mg/dL — ABNORMAL LOW (ref 8.9–10.3)
Chloride: 92 mmol/L — ABNORMAL LOW (ref 98–111)
Creatinine, Ser: 6.55 mg/dL — ABNORMAL HIGH (ref 0.61–1.24)
GFR, Estimated: 10 mL/min — ABNORMAL LOW (ref >60)
Glucose, Bld: 167 mg/dL — ABNORMAL HIGH (ref 70–99)
Potassium: 3.5 mmol/L (ref 3.5–5.1)
Sodium: 131 mmol/L — ABNORMAL LOW (ref 135–145)
Total Bilirubin: 0.5 mg/dL (ref 0.3–1.2)
Total Protein: 7.2 g/dL (ref 6.5–8.1)

## 2020-12-24 LAB — RESP PANEL BY RT-PCR (FLU A&B, COVID) ARPGX2
Influenza A by PCR: NEGATIVE
Influenza B by PCR: NEGATIVE
SARS Coronavirus 2 by RT PCR: NEGATIVE

## 2020-12-24 LAB — URINALYSIS, MICROSCOPIC (REFLEX): Bacteria, UA: NONE SEEN

## 2020-12-24 LAB — GLUCOSE, CAPILLARY: Glucose-Capillary: 139 mg/dL — ABNORMAL HIGH (ref 70–99)

## 2020-12-24 LAB — URINALYSIS, ROUTINE W REFLEX MICROSCOPIC
Glucose, UA: NEGATIVE mg/dL
Ketones, ur: NEGATIVE mg/dL
Nitrite: NEGATIVE
Protein, ur: 100 mg/dL — AB
Specific Gravity, Urine: >1.03 — ABNORMAL HIGH (ref 1.005–1.030)
pH: 5 (ref 5.0–8.0)

## 2020-12-24 LAB — PROTIME-INR
INR: 1 (ref 0.8–1.2)
Prothrombin Time: 13.6 seconds (ref 11.4–15.2)

## 2020-12-24 LAB — LACTIC ACID, PLASMA
Lactic Acid, Venous: 1.5 mmol/L (ref 0.5–1.9)
Lactic Acid, Venous: 1.3 mmol/L (ref 0.5–1.9)

## 2020-12-24 LAB — HEMOGLOBIN A1C
Hgb A1c MFr Bld: 7.2 % — ABNORMAL HIGH (ref 4.8–5.6)
Mean Plasma Glucose: 159.94 mg/dL

## 2020-12-24 LAB — SEDIMENTATION RATE: Sed Rate: 45 mm/hr — ABNORMAL HIGH (ref 0–16)

## 2020-12-24 LAB — APTT: aPTT: 28 seconds (ref 24–36)

## 2020-12-24 LAB — C-REACTIVE PROTEIN: CRP: 8.5 mg/dL — ABNORMAL HIGH (ref <1.0)

## 2020-12-24 MED ORDER — VITAMIN B-12 1000 MCG PO TABS
1000.0000 ug | ORAL_TABLET | Freq: Every day | ORAL | Status: DC
  Administered 2020-12-25 – 2021-01-05 (×11): 1000 ug via ORAL
  Filled 2020-12-24 (×11): qty 1

## 2020-12-24 MED ORDER — ACETAMINOPHEN 325 MG PO TABS: 650.0000 mg | ORAL_TABLET | Freq: Four times a day (QID) | ORAL | Status: DC | PRN

## 2020-12-24 MED ORDER — SORBITOL 70 % SOLN: 30.0000 mL | Freq: Every day | Status: DC | PRN

## 2020-12-24 MED ORDER — ONDANSETRON HCL 4 MG/2ML IJ SOLN
4.0000 mg | Freq: Four times a day (QID) | INTRAMUSCULAR | Status: DC | PRN
  Administered 2020-12-26 – 2020-12-30 (×4): 4 mg via INTRAVENOUS
  Filled 2020-12-24 (×3): qty 2

## 2020-12-24 MED ORDER — INSULIN ASPART 100 UNIT/ML IJ SOLN
0.0000 [IU] | INTRAMUSCULAR | Status: DC
  Administered 2020-12-25 (×3): 1 [IU] via SUBCUTANEOUS
  Administered 2020-12-26: 4 [IU] via SUBCUTANEOUS
  Administered 2020-12-26 – 2020-12-27 (×4): 1 [IU] via SUBCUTANEOUS
  Administered 2020-12-27: 3 [IU] via SUBCUTANEOUS
  Administered 2020-12-27: 2 [IU] via SUBCUTANEOUS
  Administered 2020-12-28 (×3): 1 [IU] via SUBCUTANEOUS
  Administered 2020-12-28: 2 [IU] via SUBCUTANEOUS
  Administered 2020-12-28: 1 [IU] via SUBCUTANEOUS
  Administered 2020-12-29: 2 [IU] via SUBCUTANEOUS
  Administered 2020-12-29: 3 [IU] via SUBCUTANEOUS
  Administered 2020-12-29 – 2020-12-30 (×3): 2 [IU] via SUBCUTANEOUS
  Administered 2020-12-30: 1 [IU] via SUBCUTANEOUS
  Administered 2020-12-30: 3 [IU] via SUBCUTANEOUS
  Administered 2020-12-31: 2 [IU] via SUBCUTANEOUS
  Administered 2020-12-31: 3 [IU] via SUBCUTANEOUS
  Administered 2020-12-31: 4 [IU] via SUBCUTANEOUS
  Administered 2021-01-01: 2 [IU] via SUBCUTANEOUS
  Administered 2021-01-01: 3 [IU] via SUBCUTANEOUS
  Administered 2021-01-01: 2 [IU] via SUBCUTANEOUS
  Administered 2021-01-01 – 2021-01-02 (×4): 1 [IU] via SUBCUTANEOUS
  Administered 2021-01-02: 2 [IU] via SUBCUTANEOUS
  Administered 2021-01-02: 0 [IU] via SUBCUTANEOUS
  Administered 2021-01-02 – 2021-01-03 (×3): 1 [IU] via SUBCUTANEOUS

## 2020-12-24 MED ORDER — ACETAMINOPHEN 650 MG RE SUPP: 650.0000 mg | Freq: Four times a day (QID) | RECTAL | Status: DC | PRN

## 2020-12-24 MED ORDER — VANCOMYCIN HCL IN DEXTROSE 1-5 GM/200ML-% IV SOLN: 1000.0000 mg | Freq: Once | INTRAVENOUS | Status: DC

## 2020-12-24 MED ORDER — VANCOMYCIN HCL IN DEXTROSE 1-5 GM/200ML-% IV SOLN
1000.0000 mg | INTRAVENOUS | Status: DC
  Administered 2020-12-28: 1000 mg via INTRAVENOUS
  Filled 2020-12-24 (×2): qty 200

## 2020-12-24 MED ORDER — ATORVASTATIN CALCIUM 40 MG PO TABS
40.0000 mg | ORAL_TABLET | Freq: Every day | ORAL | Status: DC
  Administered 2020-12-24 – 2021-01-04 (×12): 40 mg via ORAL
  Filled 2020-12-24 (×12): qty 1

## 2020-12-24 MED ORDER — ALLOPURINOL 100 MG PO TABS
100.0000 mg | ORAL_TABLET | Freq: Every day | ORAL | Status: DC
  Administered 2020-12-25 – 2021-01-05 (×11): 100 mg via ORAL
  Filled 2020-12-24 (×11): qty 1

## 2020-12-24 MED ORDER — ONDANSETRON HCL 4 MG PO TABS: 4.0000 mg | ORAL_TABLET | Freq: Four times a day (QID) | ORAL | Status: DC | PRN

## 2020-12-24 MED ORDER — SODIUM CHLORIDE 0.9 % IV SOLN
2.0000 g | Freq: Once | INTRAVENOUS | Status: AC
  Administered 2020-12-24: 2 g via INTRAVENOUS
  Filled 2020-12-24: qty 2

## 2020-12-24 MED ORDER — LACTATED RINGERS IV SOLN: INTRAVENOUS | Status: AC

## 2020-12-24 MED ORDER — VITAMIN D 25 MCG (1000 UNIT) PO TABS
2000.0000 [IU] | ORAL_TABLET | Freq: Every day | ORAL | Status: DC
  Administered 2020-12-25 – 2021-01-05 (×11): 2000 [IU] via ORAL
  Filled 2020-12-24 (×11): qty 2

## 2020-12-24 MED ORDER — OXYCODONE HCL 5 MG PO TABS
5.0000 mg | ORAL_TABLET | ORAL | Status: DC | PRN
  Administered 2020-12-26 – 2020-12-28 (×2): 5 mg via ORAL
  Filled 2020-12-24 (×2): qty 1

## 2020-12-24 MED ORDER — ASPIRIN EC 81 MG PO TBEC
81.0000 mg | DELAYED_RELEASE_TABLET | Freq: Every day | ORAL | Status: DC
  Administered 2020-12-25 – 2021-01-05 (×11): 81 mg via ORAL
  Filled 2020-12-24 (×11): qty 1

## 2020-12-24 MED ORDER — FOLIC ACID 1 MG PO TABS
1.0000 mg | ORAL_TABLET | Freq: Every day | ORAL | Status: DC
  Administered 2020-12-25 – 2021-01-05 (×11): 1 mg via ORAL
  Filled 2020-12-24 (×11): qty 1

## 2020-12-24 MED ORDER — METRONIDAZOLE 500 MG/100ML IV SOLN
500.0000 mg | Freq: Two times a day (BID) | INTRAVENOUS | Status: DC
  Administered 2020-12-24 (×2): 500 mg via INTRAVENOUS
  Filled 2020-12-24 (×2): qty 100

## 2020-12-24 MED ORDER — VANCOMYCIN HCL 2000 MG/400ML IV SOLN
2000.0000 mg | Freq: Once | INTRAVENOUS | Status: AC
  Administered 2020-12-24: 2000 mg via INTRAVENOUS
  Filled 2020-12-24: qty 400

## 2020-12-24 MED ORDER — SODIUM CHLORIDE 0.9 % IV SOLN: 2.0000 g | INTRAVENOUS | Status: DC

## 2020-12-24 NOTE — H&P (View-Only) (Signed)
[facility] [place]  ASSESSMENT / PLAN: 50 y.o. 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  I do not think the ulceration can explain his systemic signs of infection.  I offer the patient simple ligation versus distal revascularization with interval ligation to treat the ulcers about his hand.  He is interested in the latter.  We will plan to proceed with left upper extremity angiography tomorrow if able.  Will check vein mapping.  CHIEF COMPLAINT: Left hand ulceration and pain  HISTORY OF PRESENT ILLNESS: Kevin Meyer is a 50 y.o. male admitted to the hospital service for evaluation of infectious picture.  The patient had a left upper extremity brachiocephalic arteriovenous fistula created/28/22.  This is been working well for him.  He is still dialyzing via a right IJ tunneled dialysis catheter.  He is developed ulceration about his left hand over the past several weeks.  The hand is quite painful to him.  He has some clumsiness with the hand is not able to do much.  He is right-handed.  Past Medical History:  Diagnosis Date   Anemia    Diabetes mellitus without complication (HCC)    Gout    High cholesterol    Hypertension    Osteoarthritis    Renal disorder     Past Surgical History:  Procedure Laterality Date   AV FISTULA PLACEMENT Left 02/20/2020   Procedure: LEFT ARM ARTERIOVENOUS (AV) FISTULA CREATION;  Surgeon: Rosetta Posner, MD;  Location: [facility];  Service: Vascular;  Laterality: Left;   AV FISTULA PLACEMENT Left 08/20/2020   Procedure: LEFT ARM BRACHIAL CEPHALIC ARTERIOVENOUS (AV) FISTULA CREATION;  Surgeon: Rosetta Posner, MD;  Location: [facility];  Service: Vascular;  Laterality: Left;   EXCHANGE OF A DIALYSIS CATHETER Right 09/03/2020   Procedure: EXCHANGE OF A TUNNELED DIALYSIS CATHETER;  Surgeon: Rosetta Posner, MD;  Location: [facility];  Service: Vascular;  Laterality: Right;    INSERTION OF DIALYSIS CATHETER Right 06/24/2020   Procedure: INSERTION OF DIALYSIS CATHETER;  Surgeon: Virl Cagey, MD;  Location: [facility];  Service: General;  Laterality: Right;   LIGATION OF ARTERIOVENOUS  FISTULA Left 08/20/2020   Procedure: LIGATION OF  LEFT RADIOCEPHALIC ARTERIOVENOUS  FISTULA;  Surgeon: Rosetta Posner, MD;  Location: [facility];  Service: Vascular;  Laterality: Left;   NO PAST SURGERIES      Family History  Problem Relation Age of Onset   Stroke Mother    Diabetes Other    Hypertension Father     Social History   Socioeconomic History   Marital status: Single    Spouse name: Not on file   Number of children: Not on file   Years of education: Not on file   Highest education level: Not on file  Occupational History   Not on file  Tobacco Use   Smoking status: Never   Smokeless tobacco: Never  Vaping Use   Vaping Use: Never used  Substance and Sexual Activity   Alcohol use: Never    Comment: occasional   Drug use: Never   Sexual activity: Not on file  Other Topics Concern   Not on file  Social History Narrative   Not on file   Social Determinants of Health   Financial Resource Strain: Not on file  Food Insecurity: Not on file  Transportation Needs: Not on file  Physical Activity: Not on file  Stress: Not on file  Social Connections: Not on file  Intimate Partner Violence: Not on file    Allergies  Allergen Reactions   Gabapentin Anaphylaxis   Lyrica [Pregabalin] Anaphylaxis    Current Facility-Administered Medications  Medication Dose Route Frequency Provider Last Rate Last Admin   acetaminophen (TYLENOL) tablet 650 mg  650 mg Oral Q6H PRN Allie Bossier, MD       Or   acetaminophen (TYLENOL) suppository 650 mg  650 mg Rectal Q6H PRN Allie Bossier, MD       [START ON 12/25/2020] allopurinol (ZYLOPRIM) tablet 100 mg  100 mg Oral Daily Allie Bossier, MD       [START ON 12/25/2020] aspirin EC tablet 81 mg  81 mg Oral Daily Allie Bossier,  MD       atorvastatin (LIPITOR) tablet 40 mg  40 mg Oral QHS Allie Bossier, MD   40 mg at 12/24/20 2121   [START ON 12/25/2020] ceFEPIme (MAXIPIME) 2 g in sodium chloride 0.9 % 100 mL IVPB  2 g Intravenous Q M,W,F-HD Varney Biles, MD       [START ON 12/25/2020] cholecalciferol (VITAMIN D3) tablet 2,000 Units  2,000 Units Oral Daily Allie Bossier, MD       [START ON 99991111 folic acid (FOLVITE) tablet 1 mg  1 mg Oral Daily Allie Bossier, MD       insulin aspart (novoLOG) injection 0-6 Units  0-6 Units Subcutaneous Q4H Allie Bossier, MD       lactated ringers infusion   Intravenous Continuous Varney Biles, MD 150 mL/hr at 12/24/20 1059 New Bag at 12/24/20 1059   metroNIDAZOLE (FLAGYL) IVPB 500 mg  500 mg Intravenous Q12H Varney Biles, MD 100 mL/hr at 12/24/20 2122 500 mg at 12/24/20 2122   ondansetron (ZOFRAN) tablet 4 mg  4 mg Oral Q6H PRN Allie Bossier, MD       Or   ondansetron [facility]) injection 4 mg  4 mg Intravenous Q6H PRN Allie Bossier, MD       oxyCODONE (Oxy IR/ROXICODONE) immediate release tablet 5 mg  5 mg Oral Q4H PRN Allie Bossier, MD       sorbitol 70 % solution 30 mL  30 mL Oral Daily PRN Allie Bossier, MD       [START ON 12/25/2020] vancomycin (VANCOCIN) IVPB 1000 mg/200 mL premix  1,000 mg Intravenous Q M,W,F-HD Varney Biles, MD       Derrill Memo ON 12/25/2020] vitamin B-12 (CYANOCOBALAMIN) tablet 1,000 mcg  1,000 mcg Oral Daily Allie Bossier, MD        REVIEW OF SYSTEMS:  '[X]'$  denotes positive finding, '[ ]'$  denotes negative finding Cardiac  Comments:  Chest pain or chest pressure:    Shortness of breath upon exertion:    Short of breath when lying flat:    Irregular heart rhythm:        Vascular    Pain in calf, thigh, or hip brought on by ambulation:    Pain in feet at night that wakes you up from your sleep:     Blood clot in your veins:    Leg swelling:         Pulmonary    Oxygen at home:    Productive cough:     Wheezing:         Neurologic     Sudden weakness in arms or legs:     Sudden numbness in arms or legs:  Sudden onset of difficulty speaking or slurred speech:    Temporary loss of vision in one eye:     Problems with dizziness:         Gastrointestinal    Blood in stool:     Vomited blood:         Genitourinary    Burning when urinating:     Blood in urine:        Psychiatric    Major depression:         Hematologic    Bleeding problems:    Problems with blood clotting too easily:        Skin    Rashes or ulcers:        Constitutional    Fever or chills:      PHYSICAL EXAM Vitals:   12/24/20 1221 12/24/20 1327 12/24/20 1700 12/24/20 2114  BP: 120/68 127/68 (!) 149/77 117/71  Pulse: 87 86 91 90  Resp: '20 20 18 18  '$ Temp:   98.6 F (37 C) 98.6 F (37 C)  TempSrc:   Oral Oral  SpO2: 100% 98% 97% 100%  Weight:      Height:        Constitutional: well appearing. no distress. Appears well nourished.  Neurologic: CN intact. Left hand clumsy, but motor function appears intact. Sensory function intact. Psychiatric:  Mood and affect symmetric and appropriate. Eyes:  No icterus. No conjunctival pallor. Ears, nose, throat:  mucous membranes moist. Midline trachea.  Cardiac: regular rate and rhythm.  Respiratory:  unlabored. Abdominal:  soft, non-tender, non-distended.  Peripheral vascular: strong thrill in left brachiocephalic arteriovenous fistula. No pulses in L wrist. 2+ femoral pulses. Extremity: no edema. no cyanosis. no pallor.  Skin: no gangrene. + ulceration about the left hand.  Lymphatic: no Stemmer's sign. no palpable lymphadenopathy.  PERTINENT LABORATORY AND RADIOLOGIC DATA  Most recent CBC CBC Latest Ref Rng & Units 12/24/2020 09/03/2020 08/20/2020  WBC 4.0 - 10.5 K/uL 5.5 - -  Hemoglobin 13.0 - 17.0 g/dL 11.3(L) 13.9 15.0  Hematocrit 39.0 - 52.0 % 35.0(L) 41.0 44.0  Platelets 150 - 400 K/uL 126(L) - -     Most recent CMP CMP Latest Ref Rng & Units 12/24/2020 09/03/2020 08/20/2020   Glucose 70 - 99 mg/dL 167(H) 142(H) 275(H)  BUN 6 - 20 mg/dL 46(H) 46(H) 45(H)  Creatinine 0.61 - 1.24 mg/dL 6.55(H) 6.00(H) 5.20(H)  Sodium 135 - 145 mmol/L 131(L) 138 137  Potassium 3.5 - 5.1 mmol/L 3.5 4.2 4.5  Chloride 98 - 111 mmol/L 92(L) 99 97(L)  CO2 22 - 32 mmol/L 27 - -  Calcium 8.9 - 10.3 mg/dL 8.6(L) - -  Total Protein 6.5 - 8.1 g/dL 7.2 - -  Total Bilirubin 0.3 - 1.2 mg/dL 0.5 - -  Alkaline Phos 38 - 126 U/L 58 - -  AST 15 - 41 U/L 54(H) - -  ALT 0 - 44 U/L 45(H) - -    Renal function Estimated Creatinine Clearance: 15 mL/min (A) (by C-G formula based on SCr of 6.55 mg/dL (H)).  Hgb A1c MFr Bld (%)  Date Value  12/24/2020 7.2 (H)    No results found for: LDLCALC, LDLC, HIRISKLDL, POCLDL, LDLDIRECT, REALLDLC, TOTLDLC   Williemae Muriel N. Stanford Breed, MD [facility] Phone Number: [phone] 12/24/2020 11:06 PM  Total time spent on preparing this encounter including chart review, data review, collecting history, examining the patient, coordinating care for this established patient, 40 minutes.  Portions of this report may have been transcribed using voice recognition software.  Every effort has been made to ensure accuracy; however, inadvertent computerized transcription errors may still be present.

## 2020-12-24 NOTE — Progress Notes (Signed)
Pharmacy Antibiotic Note  Kevin Meyer is a 50 y.o. male admitted on 12/24/2020 with  unknown source of infection .  Patient is ESRD.  Pharmacy has been consulted for Vancomycin and Cefepime dosing.  Plan: Vancomycin 2000 mg IV x 1 dose. Vancomycin 1000 mg IV every MWF w/ HD Cefepime 2000 mg IV every MWF w/ HD. Monitor labs, c/s, and vanco level as indicated.  Height: '5\' 6"'$  (167.6 cm) Weight: 100.2 kg (221 lb) IBW/kg (Calculated) : 63.8  Temp (24hrs), Avg:99.9 F (37.7 C), Min:99.9 F (37.7 C), Max:99.9 F (37.7 C)  Recent Labs  Lab 12/24/20 1013 12/24/20 1143  WBC 5.5  --   CREATININE 6.55*  --   LATICACIDVEN 1.5 1.3    Estimated Creatinine Clearance: 15 mL/min (A) (by C-G formula based on SCr of 6.55 mg/dL (H)).    Allergies  Allergen Reactions   Gabapentin Anaphylaxis   Lyrica [Pregabalin] Anaphylaxis    Antimicrobials this admission: Vanco 9/1 >>  Cefepime 9/1 >>  Flagyl 9/1   Microbiology results: 9/1 BCx: pending   Thank you for allowing pharmacy to be a part of this patient's care.  Ramond Craver 12/24/2020 12:30 PM

## 2020-12-24 NOTE — ED Provider Notes (Signed)
[facility] EMERGENCY DEPARTMENT Provider Note   CSN: [id number] Arrival date & time: 12/24/20  [id number]     History Chief Complaint  Patient presents with   Fever    Kevin Meyer is a 50 y.o. male.  HPI     50 year old male comes in with chief complaint of fevers.  He has history of diabetes, hypertension, ESRD on HD with right sided tunneled cath, left AV fistula.  Patient reports that on Monday and then again on Wednesday, while at dialysis he started having rigors with low-grade fever.  On Tuesday he felt okay.  He was advised to come to the ER yesterday after a temp of 100.6 was recorded at the dialysis center.  Patient indicates that over his left hand he has 2 ulcers that have developed over the last 3 weeks.  He was told when fistula was being created that his circulation was poor, and that he might get some numbness over time.  He has had numbness since the surgery, but the ulcerations started more recently.  Patient does not smoke.  He denies any new pain, has neuropathy though.  Past Medical History:  Diagnosis Date   Anemia    Diabetes mellitus without complication (HCC)    Gout    High cholesterol    Hypertension    Osteoarthritis    Renal disorder     Patient Active Problem List   Diagnosis Date Noted   CHF (congestive heart failure) ([place]) 06/22/2020   Elevated brain natriuretic peptide (BNP) level 06/22/2020   Normocytic anemia 06/22/2020   Hyperglycemia due to diabetes mellitus ([place]) 06/22/2020   Osteoarthritis 10/10/2017   Stage 3 chronic kidney disease ([place]) 10/10/2017   Type 2 diabetes with nephropathy (HCC)    Acute kidney injury superimposed on CKD (HCC)    Class 2 obesity due to excess calories with body mass index (BMI) of 39.0 to 39.9 in adult    Hyperlipidemia    Hyperkalemia 08/25/2017   Renal disorder    Hypertension    AKI (acute kidney injury) [facility])     Past Surgical History:  Procedure Laterality Date   AV FISTULA PLACEMENT Left  02/20/2020   Procedure: LEFT ARM ARTERIOVENOUS (AV) FISTULA CREATION;  Surgeon: Rosetta Posner, MD;  Location: [facility];  Service: Vascular;  Laterality: Left;   AV FISTULA PLACEMENT Left 08/20/2020   Procedure: LEFT ARM BRACHIAL CEPHALIC ARTERIOVENOUS (AV) FISTULA CREATION;  Surgeon: Rosetta Posner, MD;  Location: [facility];  Service: Vascular;  Laterality: Left;   EXCHANGE OF A DIALYSIS CATHETER Right 09/03/2020   Procedure: EXCHANGE OF A TUNNELED DIALYSIS CATHETER;  Surgeon: Rosetta Posner, MD;  Location: [facility];  Service: Vascular;  Laterality: Right;   INSERTION OF DIALYSIS CATHETER Right 06/24/2020   Procedure: INSERTION OF DIALYSIS CATHETER;  Surgeon: Virl Cagey, MD;  Location: [facility];  Service: General;  Laterality: Right;   LIGATION OF ARTERIOVENOUS  FISTULA Left 08/20/2020   Procedure: LIGATION OF  LEFT RADIOCEPHALIC ARTERIOVENOUS  FISTULA;  Surgeon: Rosetta Posner, MD;  Location: [facility];  Service: Vascular;  Laterality: Left;   NO PAST SURGERIES         Family History  Problem Relation Age of Onset   Stroke Mother    Diabetes Other    Hypertension Father     Social History   Tobacco Use   Smoking status: Never   Smokeless tobacco: Never  Vaping Use   Vaping Use: Never used  Substance Use  Topics   Alcohol use: Never    Comment: occasional   Drug use: Never    Home Medications Prior to Admission medications   Medication Sig Start Date End Date Taking? Authorizing Provider  acetaminophen (TYLENOL) 500 MG tablet Take 1,000 mg by mouth every 6 (six) hours as needed for moderate pain or headache.   Yes [provider]  allopurinol (ZYLOPRIM) 100 MG tablet Take 100 mg by mouth daily.   Yes [provider]  aspirin EC 81 MG tablet Take 81 mg by mouth daily.   Yes [provider]  atorvastatin (LIPITOR) 40 MG tablet Take 40 mg by mouth at bedtime.   Yes [provider]  Cholecalciferol (VITAMIN D) 50 MCG (2000 UT) CAPS Take 2,000 Units by  mouth daily.   Yes [provider]  Dulaglutide (TRULICITY) A999333 0000000 SOPN Inject 0.75 mg into the skin every Thursday.   Yes [provider]  folic acid (FOLVITE) 1 MG tablet Take 1 tablet (1 mg total) by mouth daily. 06/29/20 06/29/21 Yes Kathie Dike, MD  glipiZIDE (GLUCOTROL) 10 MG tablet Take 1 tablet (10 mg total) by mouth 2 (two) times daily before a meal. 08/26/17  Yes Barton Dubois, MD  multivitamin (RENA-VIT) TABS tablet Take 1 tablet by mouth daily.   Yes [provider]  VELPHORO 500 MG chewable tablet Chew 500 mg by mouth 3 (three) times daily. 12/07/20  Yes [provider]  vitamin B-12 1000 MCG tablet Take 1 tablet (1,000 mcg total) by mouth daily. 06/30/20  Yes Kathie Dike, MD  calcium acetate (PHOSLO) 667 MG capsule Take 1,334 mg by mouth 3 (three) times daily with meals. Patient not taking: Reported on 12/24/2020 06/10/20   [provider]  epoetin alfa-epbx (RETACRIT) 3000 UNIT/ML injection 3,000 Units every 14 (fourteen) days. Patient not taking: Reported on 12/24/2020 06/15/20   Liana Gerold, MD    Allergies    Gabapentin and Lyrica [pregabalin]  Review of Systems   Review of Systems  Constitutional:  Positive for activity change, chills and fever.  Respiratory:  Negative for shortness of breath.   Cardiovascular:  Negative for chest pain.  Gastrointestinal:  Negative for nausea and vomiting.  Skin:  Positive for rash and wound.  Allergic/Immunologic: Positive for immunocompromised state.  All other systems reviewed and are negative.  Physical Exam Updated Vital Signs BP 120/68 (BP Location: Left Arm)   Pulse 87   Temp 99.9 F (37.7 C) (Oral)   Resp 20   Ht '5\' 6"'$  (1.676 m)   Wt 100.2 kg   SpO2 100%   BMI 35.67 kg/m   Physical Exam Vitals and nursing note reviewed.  Constitutional:      Appearance: He is well-developed.  HENT:     Head: Atraumatic.  Cardiovascular:     Rate and Rhythm: Normal rate.   Pulmonary:     Effort: Pulmonary effort is normal.  Musculoskeletal:     Cervical back: Neck supple.  Skin:    General: Skin is warm.     Findings: Lesion and rash present.     Comments: Left index finger has a necrotic appearing ulcer distally with surrounding erythema. Small digit also has an ulcer, with surrounding erythema. No clear evidence of abscess over the hand.  Range of motion is limited, but patient able to tolerate moving his digits over the IP joint.  Neurological:     Mental Status: He is alert and oriented to person, place, and time.  ED Results / Procedures / Treatments   Labs (all labs ordered are listed, but only abnormal results are displayed) Labs Reviewed  COMPREHENSIVE METABOLIC PANEL - Abnormal; Notable for the following components:      Result Value   Sodium 131 (*)    Chloride 92 (*)    Glucose, Bld 167 (*)    BUN 46 (*)    Creatinine, Ser 6.55 (*)    Calcium 8.6 (*)    AST 54 (*)    ALT 45 (*)    GFR, Estimated 10 (*)    All other components within normal limits  CBC WITH DIFFERENTIAL/PLATELET - Abnormal; Notable for the following components:   RBC 3.75 (*)    Hemoglobin 11.3 (*)    HCT 35.0 (*)    RDW 16.0 (*)    Platelets 126 (*)    All other components within normal limits  URINALYSIS, ROUTINE W REFLEX MICROSCOPIC - Abnormal; Notable for the following components:   APPearance HAZY (*)    Specific Gravity, Urine >1.030 (*)    Hgb urine dipstick SMALL (*)    Bilirubin Urine SMALL (*)    Protein, ur 100 (*)    Leukocytes,Ua TRACE (*)    All other components within normal limits  SEDIMENTATION RATE - Abnormal; Notable for the following components:   Sed Rate 45 (*)    All other components within normal limits  C-REACTIVE PROTEIN - Abnormal; Notable for the following components:   CRP 8.5 (*)    All other components within normal limits  URINALYSIS, MICROSCOPIC (REFLEX) - Abnormal; Notable for the following components:   Non  Squamous Epithelial PRESENT (*)    All other components within normal limits  RESP PANEL BY RT-PCR (FLU A&B, COVID) ARPGX2  CULTURE, BLOOD (ROUTINE X 2)  CULTURE, BLOOD (ROUTINE X 2)  LACTIC ACID, PLASMA  LACTIC ACID, PLASMA  PROTIME-INR  APTT    EKG EKG Interpretation  Date/Time:  Thursday December 24 2020 10:00:38 EDT Ventricular Rate:  88 PR Interval:  154 QRS Duration: 101 QT Interval:  371 QTC Calculation: 449 R Axis:   47 Text Interpretation: Sinus rhythm Probable left atrial enlargement Low voltage, precordial leads No acute changes No significant change since last tracing Confirmed by Varney Biles [id number]) on 12/24/2020 1:16:38 PM  Radiology DG Chest Port 1 View  Result Date: 12/24/2020 CLINICAL DATA:  Low-grade fever and shaking during dialysis EXAM: PORTABLE CHEST 1 VIEW COMPARISON:  Chest radiograph 09/03/2020 FINDINGS: The heart is enlarged, unchanged. The mediastinal contours are stable. A right sided dialysis catheter is in stable position terminating in the right atrium. There is no focal consolidation or pulmonary edema. There is no pleural effusion or pneumothorax. There is no acute osseous abnormality. IMPRESSION: 1. No radiographic evidence of acute cardiopulmonary process. 2. Unchanged cardiomegaly. Electronically Signed   By: Valetta Mole M.D.   On: 12/24/2020 10:38    Procedures .Critical Care  Date/Time: 12/24/2020 11:06 AM Performed by: Varney Biles, MD Authorized by: Varney Biles, MD   Critical care provider statement:    Critical care time (minutes):  82   Critical care was necessary to treat or prevent imminent or life-threatening deterioration of the following conditions:  Circulatory failure and sepsis   Critical care was time spent personally by me on the following activities:  Discussions with consultants, evaluation of patient's response to treatment, examination of patient, ordering and performing treatments and interventions, ordering and  review of laboratory studies, ordering and review of  radiographic studies, pulse oximetry, re-evaluation of patient's condition, obtaining history from patient or surrogate and review of old charts   Medications Ordered in ED Medications  lactated ringers infusion ( Intravenous New Bag/Given 12/24/20 1059)  metroNIDAZOLE (FLAGYL) IVPB 500 mg (0 mg Intravenous Stopped 12/24/20 1204)  vancomycin (VANCOREADY) IVPB 2000 mg/400 mL (2,000 mg Intravenous New Bag/Given 12/24/20 1255)  ceFEPIme (MAXIPIME) 2 g in sodium chloride 0.9 % 100 mL IVPB (has no administration in time range)  vancomycin (VANCOCIN) IVPB 1000 mg/200 mL premix (has no administration in time range)  ceFEPIme (MAXIPIME) 2 g in sodium chloride 0.9 % 100 mL IVPB (0 g Intravenous Stopped 12/24/20 1133)    ED Course  I have reviewed the triage vital signs and the nursing notes.  Pertinent labs & imaging results that were available during my care of the patient were reviewed by me and considered in my medical decision making (see chart for details).  Clinical Course as of 12/24/20 1317  Thu Dec 24, 2020  1216 Secretary is paged vascular surgery on 2 separate occasions already.  No response yet.  I have sent a text message to [phone]  and called in a page at [phone] just now.  [AN]  1216 Discussed case with Dr. Stanford Breed, vascular surgery.  We went over the patient's presenting symptoms of fever and rigors with finding of necrotic digit and new ulceration.  He recommends that patient be transferred to [facility] and admitted for infection work-up by the hospitalist.  They will see the patient to see if there is any steal syndrome going on. [AN]  1316 Patient made aware of the plan to admit. [AN]    Clinical Course User Index [AN] Varney Biles, MD   MDM Rules/Calculators/A&P                           50 year old male comes in a chief complaint of fevers and chills.  He has history of ESRD on HD and a right-sided tunneled catheter, he  also has diabetes history.  On exam he is noted to have 2 ulcers over his left hand, the same side where he had the AV fistula placed.   Based on history, it appears that patient likely has an underlying infection.  The nidus for infection could be the digit itself or line infection leading to bacteremia.  Endocarditis is also possible.  We have ordered blood cultures, broad-spectrum antibiotics.  I we will consult with vascular surgery about the lesions.  I reviewed patient's vascular procedure notes from last year and again this year.  Final Clinical Impression(s) / ED Diagnoses Final diagnoses:  Rigors  Fever, unspecified fever cause  Necrotic wound of left hand, initial encounter [facility])    Rx / DC Orders ED Discharge Orders     None        Varney Biles, MD 12/24/20 1317

## 2020-12-24 NOTE — ED Triage Notes (Signed)
Pt presents to ED. Pt states he is a dialysis patient. Monday and Yesterday during treatment he started shaking and running a low grade fever up to 100.6. Pt denies any fevers at any other times.

## 2020-12-24 NOTE — Consult Note (Addendum)
[facility] [place]  ASSESSMENT / PLAN: 50 y.o. 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  I do not think the ulceration can explain his systemic signs of infection.  I offer the patient simple ligation versus distal revascularization with interval ligation to treat the ulcers about his hand.  He is interested in the latter.  We will plan to proceed with left upper extremity angiography tomorrow if able.  Will check vein mapping.  CHIEF COMPLAINT: Left hand ulceration and pain  HISTORY OF PRESENT ILLNESS: Kevin Meyer is a 51 y.o. male admitted to the hospital service for evaluation of infectious picture.  The patient had a left upper extremity brachiocephalic arteriovenous fistula created/28/22.  This is been working well for him.  He is still dialyzing via a right IJ tunneled dialysis catheter.  He is developed ulceration about his left hand over the past several weeks.  The hand is quite painful to him.  He has some clumsiness with the hand is not able to do much.  He is right-handed.  Past Medical History:  Diagnosis Date   Anemia    Diabetes mellitus without complication (HCC)    Gout    High cholesterol    Hypertension    Osteoarthritis    Renal disorder     Past Surgical History:  Procedure Laterality Date   AV FISTULA PLACEMENT Left 02/20/2020   Procedure: LEFT ARM ARTERIOVENOUS (AV) FISTULA CREATION;  Surgeon: Rosetta Posner, MD;  Location: [facility];  Service: Vascular;  Laterality: Left;   AV FISTULA PLACEMENT Left 08/20/2020   Procedure: LEFT ARM BRACHIAL CEPHALIC ARTERIOVENOUS (AV) FISTULA CREATION;  Surgeon: Rosetta Posner, MD;  Location: [facility];  Service: Vascular;  Laterality: Left;   EXCHANGE OF A DIALYSIS CATHETER Right 09/03/2020   Procedure: EXCHANGE OF A TUNNELED DIALYSIS CATHETER;  Surgeon: Rosetta Posner, MD;  Location: [facility];  Service: Vascular;  Laterality: Right;    INSERTION OF DIALYSIS CATHETER Right 06/24/2020   Procedure: INSERTION OF DIALYSIS CATHETER;  Surgeon: Virl Cagey, MD;  Location: [facility];  Service: General;  Laterality: Right;   LIGATION OF ARTERIOVENOUS  FISTULA Left 08/20/2020   Procedure: LIGATION OF  LEFT RADIOCEPHALIC ARTERIOVENOUS  FISTULA;  Surgeon: Rosetta Posner, MD;  Location: [facility];  Service: Vascular;  Laterality: Left;   NO PAST SURGERIES      Family History  Problem Relation Age of Onset   Stroke Mother    Diabetes Other    Hypertension Father     Social History   Socioeconomic History   Marital status: Single    Spouse name: Not on file   Number of children: Not on file   Years of education: Not on file   Highest education level: Not on file  Occupational History   Not on file  Tobacco Use   Smoking status: Never   Smokeless tobacco: Never  Vaping Use   Vaping Use: Never used  Substance and Sexual Activity   Alcohol use: Never    Comment: occasional   Drug use: Never   Sexual activity: Not on file  Other Topics Concern   Not on file  Social History Narrative   Not on file   Social Determinants of Health   Financial Resource Strain: Not on file  Food Insecurity: Not on file  Transportation Needs: Not on file  Physical Activity: Not on file  Stress: Not on file  Social Connections: Not on file  Intimate Partner Violence: Not on file    Allergies  Allergen Reactions   Gabapentin Anaphylaxis   Lyrica [Pregabalin] Anaphylaxis    Current Facility-Administered Medications  Medication Dose Route Frequency Provider Last Rate Last Admin   acetaminophen (TYLENOL) tablet 650 mg  650 mg Oral Q6H PRN Allie Bossier, MD       Or   acetaminophen (TYLENOL) suppository 650 mg  650 mg Rectal Q6H PRN Allie Bossier, MD       [START ON 12/25/2020] allopurinol (ZYLOPRIM) tablet 100 mg  100 mg Oral Daily Allie Bossier, MD       [START ON 12/25/2020] aspirin EC tablet 81 mg  81 mg Oral Daily Allie Bossier,  MD       atorvastatin (LIPITOR) tablet 40 mg  40 mg Oral QHS Allie Bossier, MD   40 mg at 12/24/20 2121   [START ON 12/25/2020] ceFEPIme (MAXIPIME) 2 g in sodium chloride 0.9 % 100 mL IVPB  2 g Intravenous Q M,W,F-HD Varney Biles, MD       [START ON 12/25/2020] cholecalciferol (VITAMIN D3) tablet 2,000 Units  2,000 Units Oral Daily Allie Bossier, MD       [START ON 99991111 folic acid (FOLVITE) tablet 1 mg  1 mg Oral Daily Allie Bossier, MD       insulin aspart (novoLOG) injection 0-6 Units  0-6 Units Subcutaneous Q4H Allie Bossier, MD       lactated ringers infusion   Intravenous Continuous Varney Biles, MD 150 mL/hr at 12/24/20 1059 New Bag at 12/24/20 1059   metroNIDAZOLE (FLAGYL) IVPB 500 mg  500 mg Intravenous Q12H Varney Biles, MD 100 mL/hr at 12/24/20 2122 500 mg at 12/24/20 2122   ondansetron (ZOFRAN) tablet 4 mg  4 mg Oral Q6H PRN Allie Bossier, MD       Or   ondansetron [facility]) injection 4 mg  4 mg Intravenous Q6H PRN Allie Bossier, MD       oxyCODONE (Oxy IR/ROXICODONE) immediate release tablet 5 mg  5 mg Oral Q4H PRN Allie Bossier, MD       sorbitol 70 % solution 30 mL  30 mL Oral Daily PRN Allie Bossier, MD       [START ON 12/25/2020] vancomycin (VANCOCIN) IVPB 1000 mg/200 mL premix  1,000 mg Intravenous Q M,W,F-HD Varney Biles, MD       Derrill Memo ON 12/25/2020] vitamin B-12 (CYANOCOBALAMIN) tablet 1,000 mcg  1,000 mcg Oral Daily Allie Bossier, MD        REVIEW OF SYSTEMS:  '[X]'$  denotes positive finding, '[ ]'$  denotes negative finding Cardiac  Comments:  Chest pain or chest pressure:    Shortness of breath upon exertion:    Short of breath when lying flat:    Irregular heart rhythm:        Vascular    Pain in calf, thigh, or hip brought on by ambulation:    Pain in feet at night that wakes you up from your sleep:     Blood clot in your veins:    Leg swelling:         Pulmonary    Oxygen at home:    Productive cough:     Wheezing:         Neurologic     Sudden weakness in arms or legs:     Sudden numbness in arms or legs:  Sudden onset of difficulty speaking or slurred speech:    Temporary loss of vision in one eye:     Problems with dizziness:         Gastrointestinal    Blood in stool:     Vomited blood:         Genitourinary    Burning when urinating:     Blood in urine:        Psychiatric    Major depression:         Hematologic    Bleeding problems:    Problems with blood clotting too easily:        Skin    Rashes or ulcers:        Constitutional    Fever or chills:      PHYSICAL EXAM Vitals:   12/24/20 1221 12/24/20 1327 12/24/20 1700 12/24/20 2114  BP: 120/68 127/68 (!) 149/77 117/71  Pulse: 87 86 91 90  Resp: '20 20 18 18  '$ Temp:   98.6 F (37 C) 98.6 F (37 C)  TempSrc:   Oral Oral  SpO2: 100% 98% 97% 100%  Weight:      Height:        Constitutional: well appearing. no distress. Appears well nourished.  Neurologic: CN intact. Left hand clumsy, but motor function appears intact. Sensory function intact. Psychiatric:  Mood and affect symmetric and appropriate. Eyes:  No icterus. No conjunctival pallor. Ears, nose, throat:  mucous membranes moist. Midline trachea.  Cardiac: regular rate and rhythm.  Respiratory:  unlabored. Abdominal:  soft, non-tender, non-distended.  Peripheral vascular: strong thrill in left brachiocephalic arteriovenous fistula. No pulses in L wrist. 2+ femoral pulses. Extremity: no edema. no cyanosis. no pallor.  Skin: no gangrene. + ulceration about the left hand.  Lymphatic: no Stemmer's sign. no palpable lymphadenopathy.  PERTINENT LABORATORY AND RADIOLOGIC DATA  Most recent CBC CBC Latest Ref Rng & Units 12/24/2020 09/03/2020 08/20/2020  WBC 4.0 - 10.5 K/uL 5.5 - -  Hemoglobin 13.0 - 17.0 g/dL 11.3(L) 13.9 15.0  Hematocrit 39.0 - 52.0 % 35.0(L) 41.0 44.0  Platelets 150 - 400 K/uL 126(L) - -     Most recent CMP CMP Latest Ref Rng & Units 12/24/2020 09/03/2020 08/20/2020   Glucose 70 - 99 mg/dL 167(H) 142(H) 275(H)  BUN 6 - 20 mg/dL 46(H) 46(H) 45(H)  Creatinine 0.61 - 1.24 mg/dL 6.55(H) 6.00(H) 5.20(H)  Sodium 135 - 145 mmol/L 131(L) 138 137  Potassium 3.5 - 5.1 mmol/L 3.5 4.2 4.5  Chloride 98 - 111 mmol/L 92(L) 99 97(L)  CO2 22 - 32 mmol/L 27 - -  Calcium 8.9 - 10.3 mg/dL 8.6(L) - -  Total Protein 6.5 - 8.1 g/dL 7.2 - -  Total Bilirubin 0.3 - 1.2 mg/dL 0.5 - -  Alkaline Phos 38 - 126 U/L 58 - -  AST 15 - 41 U/L 54(H) - -  ALT 0 - 44 U/L 45(H) - -    Renal function Estimated Creatinine Clearance: 15 mL/min (A) (by C-G formula based on SCr of 6.55 mg/dL (H)).  Hgb A1c MFr Bld (%)  Date Value  12/24/2020 7.2 (H)    No results found for: LDLCALC, LDLC, HIRISKLDL, POCLDL, LDLDIRECT, REALLDLC, TOTLDLC   Kevin Reardon N. Stanford Breed, MD [facility] Phone Number: [phone] 12/24/2020 11:06 PM  Total time spent on preparing this encounter including chart review, data review, collecting history, examining the patient, coordinating care for this established patient, 40 minutes.  Portions of this report may have been transcribed using voice recognition software.  Every effort has been made to ensure accuracy; however, inadvertent computerized transcription errors may still be present.

## 2020-12-24 NOTE — H&P (Signed)
[facility] History and Physical  Deyshawn Picasso [id number] DOB: June 11, 1970 DOA: 12/24/2020  Referring physician:  PCP: Raiford Simmonds., PA-C   Chief Complaint: Fever and hand pain.  HPI: Kevin Meyer is a 50 y.o. WM PMHx essential HTN, HLD, OA, gout, anemia, DM type II controlled with complication, diabetic nephropathy, ESRD on HD M/W/F, S/P   right sided tunneled cath, left AV fistula.obesity, chronic systolic CHF (mild)  Patient reports that on Monday and then again on Wednesday, while at dialysis he started having rigors with low-grade fever.  On Tuesday he felt okay.  He was advised to come to the ER yesterday after a temp of 100.6 was recorded at the dialysis center.   Patient indicates that over his left hand he has 2 ulcers that have developed over the last 3 weeks.  He was told when fistula was being created that his circulation was poor, and that he might get some numbness over time.  He has had numbness since the surgery, but the ulcerations started more recently.  Patient does not smoke.  He denies any new pain, has neuropathy though.   Review of Systems:  Covid vaccination;  Constitutional:  Positive for activity change, chills and fever. HEENT:  No headaches, Difficulty swallowing,Tooth/dental problems,Sore throat,  No sneezing, itching, ear ache, nasal congestion, post nasal drip,  Cardio-vascular:  No chest pain, Orthopnea, PND, swelling in lower extremities, anasarca, dizziness, palpitations  GI:  No heartburn, indigestion, abdominal pain, nausea, vomiting, diarrhea, change in bowel habits, loss of appetite  Resp:  No shortness of breath with exertion or at rest. No excess mucus, no productive cough, No non-productive cough, No coughing up of blood.No change in color of mucus.No wheezing.No chest wall deformity  Skin:  Positive for rash and wound.  GU:  no dysuria, change in color of urine, no urgency or frequency. No flank pain.  Musculoskeletal: See  pictures below   Psych:  No change in mood or affect. No depression or anxiety. No memory loss.   Past Medical History:  Diagnosis Date   Anemia    Diabetes mellitus without complication ([place])    Gout    High cholesterol    Hypertension    Osteoarthritis    Renal disorder    Past Surgical History:  Procedure Laterality Date   AV FISTULA PLACEMENT Left 02/20/2020   Procedure: LEFT ARM ARTERIOVENOUS (AV) FISTULA CREATION;  Surgeon: Rosetta Posner, MD;  Location: [facility];  Service: Vascular;  Laterality: Left;   AV FISTULA PLACEMENT Left 08/20/2020   Procedure: LEFT ARM BRACHIAL CEPHALIC ARTERIOVENOUS (AV) FISTULA CREATION;  Surgeon: Rosetta Posner, MD;  Location: [facility];  Service: Vascular;  Laterality: Left;   EXCHANGE OF A DIALYSIS CATHETER Right 09/03/2020   Procedure: EXCHANGE OF A TUNNELED DIALYSIS CATHETER;  Surgeon: Rosetta Posner, MD;  Location: [facility];  Service: Vascular;  Laterality: Right;   INSERTION OF DIALYSIS CATHETER Right 06/24/2020   Procedure: INSERTION OF DIALYSIS CATHETER;  Surgeon: Virl Cagey, MD;  Location: [facility];  Service: General;  Laterality: Right;   LIGATION OF ARTERIOVENOUS  FISTULA Left 08/20/2020   Procedure: LIGATION OF  LEFT RADIOCEPHALIC ARTERIOVENOUS  FISTULA;  Surgeon: Rosetta Posner, MD;  Location: [facility];  Service: Vascular;  Laterality: Left;   NO PAST SURGERIES     Social History:  reports that he has never smoked. He has never used smokeless tobacco. He reports that he does not drink alcohol and does not use drugs.  Allergies  Allergen Reactions   Gabapentin Anaphylaxis   Lyrica [Pregabalin] Anaphylaxis    Family History  Problem Relation Age of Onset   Stroke Mother    Diabetes Other    Hypertension Father     Prior to Admission medications   Medication Sig Start Date End Date Taking? Authorizing Provider  acetaminophen (TYLENOL) 500 MG tablet Take 1,000 mg by mouth every 6 (six) hours as needed for moderate pain or headache.   Yes  [provider]  allopurinol (ZYLOPRIM) 100 MG tablet Take 100 mg by mouth daily.   Yes [provider]  aspirin EC 81 MG tablet Take 81 mg by mouth daily.   Yes [provider]  atorvastatin (LIPITOR) 40 MG tablet Take 40 mg by mouth at bedtime.   Yes [provider]  Cholecalciferol (VITAMIN D) 50 MCG (2000 UT) CAPS Take 2,000 Units by mouth daily.   Yes [provider]  Dulaglutide (TRULICITY) A999333 0000000 SOPN Inject 0.75 mg into the skin every Thursday.   Yes [provider]  folic acid (FOLVITE) 1 MG tablet Take 1 tablet (1 mg total) by mouth daily. 06/29/20 06/29/21 Yes Kathie Dike, MD  glipiZIDE (GLUCOTROL) 10 MG tablet Take 1 tablet (10 mg total) by mouth 2 (two) times daily before a meal. 08/26/17  Yes Barton Dubois, MD  multivitamin (RENA-VIT) TABS tablet Take 1 tablet by mouth daily.   Yes [provider]  VELPHORO 500 MG chewable tablet Chew 500 mg by mouth 3 (three) times daily. 12/07/20  Yes [provider]  vitamin B-12 1000 MCG tablet Take 1 tablet (1,000 mcg total) by mouth daily. 06/30/20  Yes Kathie Dike, MD  calcium acetate (PHOSLO) 667 MG capsule Take 1,334 mg by mouth 3 (three) times daily with meals. Patient not taking: Reported on 12/24/2020 06/10/20   [provider]  epoetin alfa-epbx (RETACRIT) 3000 UNIT/ML injection 3,000 Units every 14 (fourteen) days. Patient not taking: Reported on 12/24/2020 06/15/20   Liana Gerold, MD     Consultants:  Vascular surgery Dr. Stanford Breed consulted by ED   Procedures/Significant Events:    I have personally reviewed and interpreted all radiology studies and my findings are as above.   VENTILATOR SETTINGS:    Cultures   Antimicrobials: Anti-infectives (From admission, onward)    Start     Ordered Stop   12/25/20 1600  ceFEPIme (MAXIPIME) 2 g in sodium chloride 0.9 % 100 mL IVPB        12/24/20 1233     12/25/20 1600  vancomycin  (VANCOCIN) IVPB 1000 mg/200 mL premix        12/24/20 1233     12/24/20 1000  vancomycin (VANCOREADY) IVPB 2000 mg/400 mL        12/24/20 0946 12/24/20 1455   12/24/20 0945  ceFEPIme (MAXIPIME) 2 g in sodium chloride 0.9 % 100 mL IVPB        12/24/20 0941 12/24/20 1133   12/24/20 0945  vancomycin (VANCOCIN) IVPB 1000 mg/200 mL premix  Status:  Discontinued        12/24/20 0941 12/24/20 0946   12/24/20 0945  metroNIDAZOLE (FLAGYL) IVPB 500 mg        12/24/20 0941           Devices    LINES / TUBES:      Continuous Infusions:  [START ON 12/25/2020] ceFEPime (MAXIPIME) IV     lactated ringers 150 mL/hr at 12/24/20 1059   metronidazole Stopped (  12/24/20 1204)   [START ON 12/25/2020] vancomycin     vancomycin 2,000 mg (12/24/20 1255)    Physical Exam: Vitals:   12/24/20 1115 12/24/20 1118 12/24/20 1221 12/24/20 1327  BP: 122/68 122/68 120/68 127/68  Pulse: 90 90 87 86  Resp:  '20 20 20  '$ Temp:      TempSrc:      SpO2: 99% 100% 100% 98%  Weight:      Height:        Wt Readings from Last 3 Encounters:  12/24/20 100.2 kg  09/03/20 100.7 kg  08/31/20 100.7 kg    General: A/O x4, No acute respiratory distress Eyes: negative scleral hemorrhage, negative anisocoria, negative icterus ENT: Negative Runny nose, negative gingival bleeding, Neck:  Negative scars, masses, torticollis, lymphadenopathy, JVD Lungs: Clear to auscultation bilaterally without wheezes or crackles Cardiovascular: Regular rate and rhythm without murmur gallop or rub normal S1 and S2 Abdomen: OBESE, negative abdominal pain, nondistended, positive soft, bowel sounds, no rebound, no ascites, no appreciable mass Extremities: left hand lesions, necrosis see pictures below Psychiatric:  Negative depression, negative anxiety, negative fatigue, negative mania  Central nervous system:  Cranial nerves II through XII intact, tongue/uvula midline, all extremities muscle strength 5/5, sensation intact throughout,  negative dysarthria, negative expressive aphasia, negative receptive aphasia.  LEFT finger infection              Labs on Admission:  Basic Metabolic Panel: Recent Labs  Lab 12/24/20 1013  NA 131*  K 3.5  CL 92*  CO2 27  GLUCOSE 167*  BUN 46*  CREATININE 6.55*  CALCIUM 8.6*   Liver Function Tests: Recent Labs  Lab 12/24/20 1013  AST 54*  ALT 45*  ALKPHOS 58  BILITOT 0.5  PROT 7.2  ALBUMIN 3.7   No results for input(s): LIPASE, AMYLASE in the last 168 hours. No results for input(s): AMMONIA in the last 168 hours. CBC: Recent Labs  Lab 12/24/20 1013  WBC 5.5  NEUTROABS 3.5  HGB 11.3*  HCT 35.0*  MCV 93.3  PLT 126*   Cardiac Enzymes: No results for input(s): CKTOTAL, CKMB, CKMBINDEX, TROPONINI in the last 168 hours.  BNP (last 3 results) Recent Labs    06/22/20 2057  BNP 2,080.0*    ProBNP (last 3 results) No results for input(s): PROBNP in the last 8760 hours.  CBG: No results for input(s): GLUCAP in the last 168 hours.  Radiological Exams on Admission: DG Chest Port 1 View  Result Date: 12/24/2020 CLINICAL DATA:  Low-grade fever and shaking during dialysis EXAM: PORTABLE CHEST 1 VIEW COMPARISON:  Chest radiograph 09/03/2020 FINDINGS: The heart is enlarged, unchanged. The mediastinal contours are stable. A right sided dialysis catheter is in stable position terminating in the right atrium. There is no focal consolidation or pulmonary edema. There is no pleural effusion or pneumothorax. There is no acute osseous abnormality. IMPRESSION: 1. No radiographic evidence of acute cardiopulmonary process. 2. Unchanged cardiomegaly. Electronically Signed   By: Valetta Mole M.D.   On: 12/24/2020 10:38    EKG: Independently reviewed.   Assessment/Plan Active Problems:   Hypertension   Type 2 diabetes with nephropathy (HCC)   Class 2 obesity due to excess calories with body mass index (BMI) of 39.0 to 39.9 in adult   Hyperlipidemia   Osteoarthritis    Diabetes mellitus type 2, controlled, with complications (HCC)   Infection of finger LEFT  Infection of finger LEFT - Continue broad-spectrum antibiotics - Transfer to [facility] notify Dr.  Hawken vascular surgery upon arrival.  Dr. Kathrynn Humble to the ED physician spoke with him concerning patient's necrotic LEFT digit and ulcerations.  Essential HTN -BP currently controlled without medication. - Continue to monitor closely  DM type II with nephropathy -Very Sensitive SSI  DM type II controlled with complication -See DM type II  Class II obesity due to excess calories (BMI 39 to 39.9 Kg/m)    Code Status: Full (DVT Prophylaxis: SCD Family Communication:   Status is: Inpatient    Dispo: The patient is from: Home              Anticipated d/c is to: Home              Anticipated d/c date is: > 3 days              Patient currently is not medically stable to d/c.     Data Reviewed: Care during the described time interval was provided by me .  I have reviewed this patient's available data, including medical history, events of note, physical examination, and all test results as part of my evaluation.   The patient is critically ill with multiple organ systems failure and requires high complexity decision making for assessment and support, frequent evaluation and titration of therapies, application of advanced monitoring technologies and extensive interpretation of multiple databases. Critical Care Time devoted to patient care services described in this note  Time spent: 70 minutes   Bradshaw Minihan, [facility]

## 2020-12-24 NOTE — Sepsis Progress Note (Signed)
eLink monitoring code sepsis.  

## 2020-12-25 ENCOUNTER — Encounter (HOSPITAL_COMMUNITY)
Admission: EM | Disposition: A | Discharge status: Home / Self Care | Payer: Self-pay | Source: Home / Self Care | Attending: Family Medicine

## 2020-12-25 ENCOUNTER — Inpatient Hospital Stay (HOSPITAL_COMMUNITY): Payer: Medicaid Other

## 2020-12-25 DIAGNOSIS — Z6839 Body mass index (BMI) 39.0-39.9, adult: Secondary | ICD-10-CM | POA: Diagnosis not present

## 2020-12-25 DIAGNOSIS — Z01810 Encounter for preprocedural cardiovascular examination: Secondary | ICD-10-CM | POA: Diagnosis not present

## 2020-12-25 DIAGNOSIS — L98499 Non-pressure chronic ulcer of skin of other sites with unspecified severity: Secondary | ICD-10-CM

## 2020-12-25 DIAGNOSIS — T82590A Other mechanical complication of surgically created arteriovenous fistula, initial encounter: Secondary | ICD-10-CM

## 2020-12-25 DIAGNOSIS — L0390 Cellulitis, unspecified: Secondary | ICD-10-CM | POA: Diagnosis not present

## 2020-12-25 DIAGNOSIS — E118 Type 2 diabetes mellitus with unspecified complications: Secondary | ICD-10-CM | POA: Diagnosis not present

## 2020-12-25 HISTORY — PX: AORTIC ARCH ANGIOGRAPHY: CATH118224

## 2020-12-25 HISTORY — PX: UPPER EXTREMITY ANGIOGRAPHY: CATH118270

## 2020-12-25 LAB — CBC
HCT: 35.9 % — ABNORMAL LOW (ref 39.0–52.0)
Hemoglobin: 11.8 g/dL — ABNORMAL LOW (ref 13.0–17.0)
MCH: 29.9 pg (ref 26.0–34.0)
MCHC: 32.9 g/dL (ref 30.0–36.0)
MCV: 91.1 fL (ref 80.0–100.0)
Platelets: 124 10*3/uL — ABNORMAL LOW (ref 150–400)
RBC: 3.94 MIL/uL — ABNORMAL LOW (ref 4.22–5.81)
RDW: 15.5 % (ref 11.5–15.5)
WBC: 5.8 10*3/uL (ref 4.0–10.5)
nRBC: 0 % (ref 0.0–0.2)

## 2020-12-25 LAB — MAGNESIUM: Magnesium: 2.1 mg/dL (ref 1.7–2.4)

## 2020-12-25 LAB — CBC WITH DIFFERENTIAL/PLATELET
Abs Immature Granulocytes: 0.01 10*3/uL (ref 0.00–0.07)
Basophils Absolute: 0 10*3/uL (ref 0.0–0.1)
Basophils Relative: 1 %
Eosinophils Absolute: 0.1 10*3/uL (ref 0.0–0.5)
Eosinophils Relative: 2 %
HCT: 32 % — ABNORMAL LOW (ref 39.0–52.0)
Hemoglobin: 10.3 g/dL — ABNORMAL LOW (ref 13.0–17.0)
Immature Granulocytes: 0 %
Lymphocytes Relative: 29 %
Lymphs Abs: 1.6 10*3/uL (ref 0.7–4.0)
MCH: 29.7 pg (ref 26.0–34.0)
MCHC: 32.2 g/dL (ref 30.0–36.0)
MCV: 92.2 fL (ref 80.0–100.0)
Monocytes Absolute: 0.8 10*3/uL (ref 0.1–1.0)
Monocytes Relative: 16 %
Neutro Abs: 2.8 10*3/uL (ref 1.7–7.7)
Neutrophils Relative %: 52 %
Platelets: UNDETERMINED 10*3/uL (ref 150–400)
RBC: 3.47 MIL/uL — ABNORMAL LOW (ref 4.22–5.81)
RDW: 15.9 % — ABNORMAL HIGH (ref 11.5–15.5)
Smear Review: UNDETERMINED
WBC: 5.3 10*3/uL (ref 4.0–10.5)
nRBC: 0 % (ref 0.0–0.2)

## 2020-12-25 LAB — COMPREHENSIVE METABOLIC PANEL
ALT: 40 U/L (ref 0–44)
AST: 46 U/L — ABNORMAL HIGH (ref 15–41)
Albumin: 3.1 g/dL — ABNORMAL LOW (ref 3.5–5.0)
Alkaline Phosphatase: 46 U/L (ref 38–126)
Anion gap: 14 (ref 5–15)
BUN: 57 mg/dL — ABNORMAL HIGH (ref 6–20)
CO2: 25 mmol/L (ref 22–32)
Calcium: 8.5 mg/dL — ABNORMAL LOW (ref 8.9–10.3)
Chloride: 94 mmol/L — ABNORMAL LOW (ref 98–111)
Creatinine, Ser: 7.69 mg/dL — ABNORMAL HIGH (ref 0.61–1.24)
GFR, Estimated: 8 mL/min — ABNORMAL LOW (ref >60)
Glucose, Bld: 151 mg/dL — ABNORMAL HIGH (ref 70–99)
Potassium: 3.2 mmol/L — ABNORMAL LOW (ref 3.5–5.1)
Sodium: 133 mmol/L — ABNORMAL LOW (ref 135–145)
Total Bilirubin: 0.7 mg/dL (ref 0.3–1.2)
Total Protein: 6.2 g/dL — ABNORMAL LOW (ref 6.5–8.1)

## 2020-12-25 LAB — GLUCOSE, CAPILLARY
Glucose-Capillary: 82 mg/dL (ref 70–99)
Glucose-Capillary: 156 mg/dL — ABNORMAL HIGH (ref 70–99)
Glucose-Capillary: 110 mg/dL — ABNORMAL HIGH (ref 70–99)
Glucose-Capillary: 114 mg/dL — ABNORMAL HIGH (ref 70–99)
Glucose-Capillary: 150 mg/dL — ABNORMAL HIGH (ref 70–99)
Glucose-Capillary: 171 mg/dL — ABNORMAL HIGH (ref 70–99)
Glucose-Capillary: 172 mg/dL — ABNORMAL HIGH (ref 70–99)

## 2020-12-25 LAB — RENAL FUNCTION PANEL
Albumin: 3.4 g/dL — ABNORMAL LOW (ref 3.5–5.0)
Anion gap: 19 — ABNORMAL HIGH (ref 5–15)
BUN: 65 mg/dL — ABNORMAL HIGH (ref 6–20)
CO2: 20 mmol/L — ABNORMAL LOW (ref 22–32)
Calcium: 8.8 mg/dL — ABNORMAL LOW (ref 8.9–10.3)
Chloride: 95 mmol/L — ABNORMAL LOW (ref 98–111)
Creatinine, Ser: 7.95 mg/dL — ABNORMAL HIGH (ref 0.61–1.24)
GFR, Estimated: 8 mL/min — ABNORMAL LOW (ref >60)
Glucose, Bld: 182 mg/dL — ABNORMAL HIGH (ref 70–99)
Phosphorus: 6.4 mg/dL — ABNORMAL HIGH (ref 2.5–4.6)
Potassium: 3.8 mmol/L (ref 3.5–5.1)
Sodium: 134 mmol/L — ABNORMAL LOW (ref 135–145)

## 2020-12-25 LAB — PHOSPHORUS: Phosphorus: 6.1 mg/dL — ABNORMAL HIGH (ref 2.5–4.6)

## 2020-12-25 LAB — CREATININE, SERUM
Creatinine, Ser: 8.16 mg/dL — ABNORMAL HIGH (ref 0.61–1.24)
GFR, Estimated: 7 mL/min — ABNORMAL LOW (ref >60)

## 2020-12-25 SURGERY — UPPER EXTREMITY ANGIOGRAPHY
Anesthesia: LOCAL

## 2020-12-25 MED ORDER — SODIUM CHLORIDE 0.9% FLUSH: 3.0000 mL | INTRAVENOUS | Status: DC | PRN

## 2020-12-25 MED ORDER — HYDRALAZINE HCL 20 MG/ML IJ SOLN: 5.0000 mg | INTRAMUSCULAR | Status: DC | PRN

## 2020-12-25 MED ORDER — SODIUM CHLORIDE 0.9 % IV SOLN: 100.0000 mL | INTRAVENOUS | Status: DC | PRN

## 2020-12-25 MED ORDER — ACETAMINOPHEN 325 MG PO TABS
650.0000 mg | ORAL_TABLET | ORAL | Status: DC | PRN
  Administered 2020-12-26 – 2020-12-31 (×3): 650 mg via ORAL
  Filled 2020-12-25 (×3): qty 2

## 2020-12-25 MED ORDER — RENA-VITE PO TABS
1.0000 | ORAL_TABLET | Freq: Every day | ORAL | Status: DC
  Administered 2020-12-26 – 2021-01-04 (×10): 1 via ORAL
  Filled 2020-12-25 (×10): qty 1

## 2020-12-25 MED ORDER — SODIUM CHLORIDE 0.9 % IV SOLN
100.0000 mL | INTRAVENOUS | Status: DC | PRN
Start: 2020-12-25 — End: 2020-12-26

## 2020-12-25 MED ORDER — HEPARIN (PORCINE) IN NACL 1000-0.9 UT/500ML-% IV SOLN
INTRAVENOUS | Status: DC | PRN
  Administered 2020-12-25 (×2): 500 mL

## 2020-12-25 MED ORDER — MIDAZOLAM HCL 2 MG/2ML IJ SOLN
INTRAMUSCULAR | Status: AC
  Filled 2020-12-25: qty 2

## 2020-12-25 MED ORDER — IODIXANOL 320 MG/ML IV SOLN
INTRAVENOUS | Status: DC | PRN
  Administered 2020-12-25: 95 mL

## 2020-12-25 MED ORDER — SODIUM CHLORIDE 0.9 % IV SOLN
250.0000 mL | INTRAVENOUS | Status: DC | PRN
Start: 2020-12-25 — End: 2021-01-04

## 2020-12-25 MED ORDER — HEPARIN SODIUM (PORCINE) 1000 UNIT/ML DIALYSIS
1000.0000 [IU] | INTRAMUSCULAR | Status: DC | PRN
  Administered 2020-12-26: 3800 [IU] via INTRAVENOUS_CENTRAL
  Filled 2020-12-25 (×2): qty 1

## 2020-12-25 MED ORDER — CEPHALEXIN 500 MG PO CAPS
500.0000 mg | ORAL_CAPSULE | Freq: Two times a day (BID) | ORAL | Status: DC
  Administered 2020-12-25 – 2020-12-29 (×9): 500 mg via ORAL
  Filled 2020-12-25 (×10): qty 1

## 2020-12-25 MED ORDER — FENTANYL CITRATE (PF) 100 MCG/2ML IJ SOLN
INTRAMUSCULAR | Status: AC
  Filled 2020-12-25: qty 2

## 2020-12-25 MED ORDER — ALTEPLASE 2 MG IJ SOLR: 2.0000 mg | Freq: Once | INTRAMUSCULAR | Status: DC | PRN

## 2020-12-25 MED ORDER — CHLORHEXIDINE GLUCONATE CLOTH 2 % EX PADS
6.0000 | MEDICATED_PAD | Freq: Every day | CUTANEOUS | Status: DC
  Administered 2020-12-25 – 2020-12-29 (×5): 6 via TOPICAL

## 2020-12-25 MED ORDER — LABETALOL HCL 5 MG/ML IV SOLN: 10.0000 mg | INTRAVENOUS | Status: DC | PRN

## 2020-12-25 MED ORDER — HEPARIN (PORCINE) IN NACL 1000-0.9 UT/500ML-% IV SOLN
INTRAVENOUS | Status: AC
  Filled 2020-12-25: qty 1000

## 2020-12-25 MED ORDER — LIDOCAINE HCL (PF) 1 % IJ SOLN
INTRAMUSCULAR | Status: AC
  Filled 2020-12-25: qty 30

## 2020-12-25 MED ORDER — FENTANYL CITRATE (PF) 100 MCG/2ML IJ SOLN
INTRAMUSCULAR | Status: DC | PRN
  Administered 2020-12-25: 50 ug via INTRAVENOUS

## 2020-12-25 MED ORDER — NEPRO/CARBSTEADY PO LIQD
237.0000 mL | Freq: Two times a day (BID) | ORAL | Status: DC
  Administered 2020-12-26 – 2021-01-03 (×13): 237 mL via ORAL

## 2020-12-25 MED ORDER — LIDOCAINE HCL (PF) 1 % IJ SOLN: 5.0000 mL | INTRAMUSCULAR | Status: DC | PRN

## 2020-12-25 MED ORDER — LIDOCAINE-PRILOCAINE 2.5-2.5 % EX CREA: 1.0000 "application " | TOPICAL_CREAM | CUTANEOUS | Status: DC | PRN

## 2020-12-25 MED ORDER — HEPARIN SODIUM (PORCINE) 5000 UNIT/ML IJ SOLN
5000.0000 [IU] | Freq: Three times a day (TID) | INTRAMUSCULAR | Status: DC
  Administered 2020-12-25 – 2021-01-05 (×28): 5000 [IU] via SUBCUTANEOUS
  Filled 2020-12-25 (×29): qty 1

## 2020-12-25 MED ORDER — ONDANSETRON HCL 4 MG/2ML IJ SOLN
4.0000 mg | Freq: Four times a day (QID) | INTRAMUSCULAR | Status: DC | PRN
  Filled 2020-12-25: qty 2

## 2020-12-25 MED ORDER — SODIUM CHLORIDE 0.9% FLUSH
3.0000 mL | Freq: Two times a day (BID) | INTRAVENOUS | Status: DC
  Administered 2020-12-25 – 2021-01-04 (×17): 3 mL via INTRAVENOUS

## 2020-12-25 MED ORDER — PENTAFLUOROPROP-TETRAFLUOROETH EX AERO: 1.0000 "application " | INHALATION_SPRAY | CUTANEOUS | Status: DC | PRN

## 2020-12-25 MED ORDER — MIDAZOLAM HCL 2 MG/2ML IJ SOLN
INTRAMUSCULAR | Status: DC | PRN
  Administered 2020-12-25: 1 mg via INTRAVENOUS

## 2020-12-25 MED ORDER — LIDOCAINE HCL (PF) 1 % IJ SOLN
INTRAMUSCULAR | Status: DC | PRN
  Administered 2020-12-25: 17 mL

## 2020-12-25 SURGICAL SUPPLY — 12 items
CATH ANGIO 5F PIGTAIL 100CM (CATHETERS) ×3 IMPLANT
CATH HEADHUNTER H1 5F 100CM (CATHETERS) ×3 IMPLANT
CLOSURE MYNX CONTROL 5F (Vascular Products) ×3 IMPLANT
KIT MICROPUNCTURE NIT STIFF (SHEATH) ×3 IMPLANT
KIT PV (KITS) ×3 IMPLANT
SHEATH PINNACLE 5F 10CM (SHEATH) ×3 IMPLANT
SHEATH PROBE COVER 6X72 (BAG) ×3 IMPLANT
SYR MEDRAD MARK V 150ML (SYRINGE) ×3 IMPLANT
TRANSDUCER W/STOPCOCK (MISCELLANEOUS) ×3 IMPLANT
TRAY PV CATH (CUSTOM PROCEDURE TRAY) ×3 IMPLANT
WIRE J 3MM .035X145CM (WIRE) ×3 IMPLANT
WIRE STARTER BENTSON 035X150 (WIRE) ×3 IMPLANT

## 2020-12-25 NOTE — Op Note (Signed)
   PATIENT: Kevin Meyer      MRN: [id number] DOB: May 14, 1970    DATE OF PROCEDURE: 12/25/2020  INDICATIONS:    [name] is a 50 y.o. male who had a left brachiocephalic fistula placed earlier this year.  He had significant steal symptoms and was seen in consultation.  He is being considered for a DRIL procedure and was set up for an upper extremity arteriogram  PROCEDURE:    Conscious sedation Ultrasound-guided access to the right common femoral artery Arch aortogram Selective catheterization of the left brachial artery with left upper extremity runoff Mynx closure right common femoral artery  SURGEON: Judeth Cornfield. Scot Dock, MD, FACS  ANESTHESIA: Local with sedation  EBL: Minimal  TECHNIQUE: The patient was brought to the peripheral vascular lab and was sedated. The period of conscious sedation was 47 minutes.  During that time period, I was present face-to-face 100% of the time.  The patient was administered 1 mg of Versed and 50 mcg of fentanyl. The patient's heart rate, blood pressure, and oxygen saturation were monitored by the nurse continuously during the procedure.  Both groins were prepped and draped in the usual sterile fashion.  Under ultrasound guidance, after the skin was anesthetized, I cannulated the right common femoral artery with a micropuncture needle and a micropuncture sheath was introduced over a wire.  This was exchanged for a 5 [place] sheath over a Bentson wire.  By ultrasound the femoral artery was patent. A real-time image was obtained and sent to the server.  A pigtail catheter was positioned in the ascending aorta and an arch aortogram was obtained in a 40 degree LAO projection.  I then selected the left subclavian artery and advanced the wire down past the vertebral into the brachial artery.  Selective left upper extremity arteriogram was then obtained.  We also obtain films with compression of the fistula.  At the completion the catheter was removed.  The Mynx  device was passed through the sheath and then deployed in the standard fashion.  There was good hemostasis.  FINDINGS:   No significant disease of the aortic arch, innominate, left proximal common carotid artery, and left proximal subclavian artery. The left subclavian, axillary, and brachial arteries are widely patent. The fistula is patent with several large competing branches.  There is minimal narrowing at the proximal end of the fistula. There is an early takeoff of the radial artery just beyond the anastomosis.  The radial artery is somewhat small with some moderate diffuse disease.  It fills the palmar arch in the hand. There is an interosseous branch which occludes above the wrist.  The ulnar artery is patent to the hand.     Deitra Mayo, MD, FACS [facility] [place] DICTATION:   12/25/2020

## 2020-12-25 NOTE — Interval H&P Note (Signed)
History and Physical Interval Note:  12/25/2020 10:49 AM  Kevin Meyer  has presented today for surgery, with the diagnosis of steal syndrome.  The various methods of treatment have been discussed with the patient and family. After consideration of risks, benefits and other options for treatment, the patient has consented to  Procedure(s): UPPER EXTREMITY ANGIOGRAPHY (Left) as a surgical intervention.  The patient's history has been reviewed, patient examined, no change in status, stable for surgery.  I have reviewed the patient's chart and labs.  Questions were answered to the patient's satisfaction.     Deitra Mayo

## 2020-12-25 NOTE — Progress Notes (Signed)
Lower extremity vein mapping study completed.   Please see CV Proc for preliminary results.   Dellia Donnelly, RDMS, RVT  

## 2020-12-25 NOTE — Progress Notes (Signed)
  Hospital Brief Note  Pt seen and examined this morning. Doing well.   No changes in left hand Wounds stable, sensory deficit, no palpable pulse in the left wrist.  Excellent thrill in the fistula.  Pt with steal syndrome and will need vascular surgery intervention for hand salvage.  Prior to OR pt will require vein mapping and LUE angiogram to assess flow.  Pt NPO for angiogram today Vein mapping ordered   Cassandria Santee MD [facility] [phone] 12/25/2020  10:31 AM

## 2020-12-25 NOTE — Consult Note (Signed)
Referring Provider: No ref. provider found Primary Care Physician:  Raiford Simmonds., PA-C Primary Nephrologist:  Dr. Marval Regal  Reason for Consultation: Medical management end-stage renal disease, maintenance of euvolemia, treatment and assessment of anemia, treatment assessment secondary hyperparathyroidism.  HPI: This is a 50 year old gentleman history of end-stage renal disease Monday Wednesday Friday dialysis reach for kidney center.  He also has a history of diabetes hyperlipidemia hypertension.  He has a left upper extremity brachiocephalic AV fistula causing steal syndrome with ulceration of his hand.  He is to undergo left upper extremity angiography.  At the present he is undergoing dialysis for the right IJ tunneled dialysis cath.  Sodium 133 potassium 3.2 chloride 94 CO2 25 BUN 57 creatinine 7.6 glucose 151 calcium 8.6 phosphorus 6.1 magnesium 2.1 albumin 3.1 hemoglobin 10.3  Home medications: Glucotrol 10 mg twice daily, PhosLo 1.3 g with meals, Velphoro 500 mg 3 times daily, allopurinol 100 mg daily, aspirin 81 mg daily, atorvastatin 40 mg daily, cholecalciferol, folic acid, vitamin [id number].  Past Medical History:  Diagnosis Date   Anemia    Diabetes mellitus without complication ([place])    Gout    High cholesterol    Hypertension    Osteoarthritis    Renal disorder     Past Surgical History:  Procedure Laterality Date   AV FISTULA PLACEMENT Left 02/20/2020   Procedure: LEFT ARM ARTERIOVENOUS (AV) FISTULA CREATION;  Surgeon: Rosetta Posner, MD;  Location: [facility];  Service: Vascular;  Laterality: Left;   AV FISTULA PLACEMENT Left 08/20/2020   Procedure: LEFT ARM BRACHIAL CEPHALIC ARTERIOVENOUS (AV) FISTULA CREATION;  Surgeon: Rosetta Posner, MD;  Location: [facility];  Service: Vascular;  Laterality: Left;   EXCHANGE OF A DIALYSIS CATHETER Right 09/03/2020   Procedure: EXCHANGE OF A TUNNELED DIALYSIS CATHETER;  Surgeon: Rosetta Posner, MD;  Location: [facility];  Service: Vascular;   Laterality: Right;   INSERTION OF DIALYSIS CATHETER Right 06/24/2020   Procedure: INSERTION OF DIALYSIS CATHETER;  Surgeon: Virl Cagey, MD;  Location: [facility];  Service: General;  Laterality: Right;   LIGATION OF ARTERIOVENOUS  FISTULA Left 08/20/2020   Procedure: LIGATION OF  LEFT RADIOCEPHALIC ARTERIOVENOUS  FISTULA;  Surgeon: Rosetta Posner, MD;  Location: [facility];  Service: Vascular;  Laterality: Left;   NO PAST SURGERIES      Prior to Admission medications   Medication Sig Start Date End Date Taking? Authorizing Provider  acetaminophen (TYLENOL) 500 MG tablet Take 1,000 mg by mouth every 6 (six) hours as needed for moderate pain or headache.   Yes [provider]  allopurinol (ZYLOPRIM) 100 MG tablet Take 100 mg by mouth daily.   Yes [provider]  aspirin EC 81 MG tablet Take 81 mg by mouth daily.   Yes [provider]  atorvastatin (LIPITOR) 40 MG tablet Take 40 mg by mouth at bedtime.   Yes [provider]  Cholecalciferol (VITAMIN D) 50 MCG (2000 UT) CAPS Take 2,000 Units by mouth daily.   Yes [provider]  Dulaglutide (TRULICITY) [id number] [id number] SOPN Inject 0.75 mg into the skin every Thursday.   Yes [provider]  folic acid (FOLVITE) 1 MG tablet Take 1 tablet (1 mg total) by mouth daily. 06/29/20 06/29/21 Yes Kathie Dike, MD  glipiZIDE (GLUCOTROL) 10 MG tablet Take 1 tablet (10 mg total) by mouth 2 (two) times daily before a meal. 08/26/17  Yes Barton Dubois, MD  multivitamin (RENA-VIT) TABS tablet Take 1 tablet by mouth  daily.   Yes [provider]  VELPHORO 500 MG chewable tablet Chew 500 mg by mouth 3 (three) times daily. 12/07/20  Yes [provider]  vitamin B-12 1000 MCG tablet Take 1 tablet (1,000 mcg total) by mouth daily. 06/30/20  Yes Kathie Dike, MD  calcium acetate (PHOSLO) 667 MG capsule Take 1,334 mg by mouth 3 (three) times daily with meals. Patient not taking: Reported on 12/24/2020 06/10/20    [provider]  epoetin alfa-epbx (RETACRIT) 3000 UNIT/ML injection 3,000 Units every 14 (fourteen) days. Patient not taking: Reported on 12/24/2020 06/15/20   Liana Gerold, MD    Current Facility-Administered Medications  Medication Dose Route Frequency Provider Last Rate Last Admin   acetaminophen (TYLENOL) tablet 650 mg  650 mg Oral Q6H PRN Allie Bossier, MD       Or   acetaminophen (TYLENOL) suppository 650 mg  650 mg Rectal Q6H PRN Allie Bossier, MD       allopurinol (ZYLOPRIM) tablet 100 mg  100 mg Oral Daily Allie Bossier, MD       aspirin EC tablet 81 mg  81 mg Oral Daily Allie Bossier, MD       atorvastatin (LIPITOR) tablet 40 mg  40 mg Oral QHS Allie Bossier, MD   40 mg at 12/24/20 2121   ceFEPIme (MAXIPIME) 2 g in sodium chloride 0.9 % 100 mL IVPB  2 g Intravenous Q M,W,F-HD Kathrynn Humble, Ankit, MD       Chlorhexidine Gluconate Cloth 2 % PADS 6 each  6 each Topical Q0600 Edrick Oh, MD       cholecalciferol (VITAMIN D3) tablet 2,000 Units  2,000 Units Oral Daily Allie Bossier, MD       folic acid (FOLVITE) tablet 1 mg  1 mg Oral Daily Allie Bossier, MD       insulin aspart (novoLOG) injection 0-6 Units  0-6 Units Subcutaneous Q4H Allie Bossier, MD   1 Units at 12/25/20 0128   metroNIDAZOLE (FLAGYL) IVPB 500 mg  500 mg Intravenous Q12H Nanavati, Ankit, MD 100 mL/hr at 12/24/20 2122 500 mg at 12/24/20 2122   ondansetron (ZOFRAN) tablet 4 mg  4 mg Oral Q6H PRN Allie Bossier, MD       Or   ondansetron [facility]) injection 4 mg  4 mg Intravenous Q6H PRN Allie Bossier, MD       oxyCODONE (Oxy IR/ROXICODONE) immediate release tablet 5 mg  5 mg Oral Q4H PRN Allie Bossier, MD       sorbitol 70 % solution 30 mL  30 mL Oral Daily PRN Allie Bossier, MD       vancomycin (VANCOCIN) IVPB 1000 mg/200 mL premix  1,000 mg Intravenous Q M,W,F-HD Kathrynn Humble, Ankit, MD       vitamin B-12 (CYANOCOBALAMIN) tablet 1,000 mcg  1,000 mcg Oral Daily Allie Bossier, MD         Allergies as of 12/24/2020 - Review Complete 12/24/2020  Allergen Reaction Noted   Gabapentin Anaphylaxis 08/25/2017   Lyrica [pregabalin] Anaphylaxis 08/25/2017    Family History  Problem Relation Age of Onset   Stroke Mother    Diabetes Other    Hypertension Father     Social History   Socioeconomic History   Marital status: Single    Spouse name: Not on file   Number of children: Not on file   Years of education: Not on file   Highest education level:  Not on file  Occupational History   Not on file  Tobacco Use   Smoking status: Never   Smokeless tobacco: Never  Vaping Use   Vaping Use: Never used  Substance and Sexual Activity   Alcohol use: Never    Comment: occasional   Drug use: Never   Sexual activity: Not on file  Other Topics Concern   Not on file  Social History Narrative   Not on file   Social Determinants of Health   Financial Resource Strain: Not on file  Food Insecurity: Not on file  Transportation Needs: Not on file  Physical Activity: Not on file  Stress: Not on file  Social Connections: Not on file  Intimate Partner Violence: Not on file    Review of Systems: Negative apart from HPI.  History of diabetes  Physical Exam: Vital signs in last 24 hours: Temp:  [98.6 F (37 C)-99.9 F (37.7 C)] 98.6 F (37 C) (09/01 2114) Pulse Rate:  [86-96] 90 (09/01 2114) Resp:  [18-20] 18 (09/01 2114) BP: (112-149)/(61-77) 117/71 (09/01 2114) SpO2:  [97 %-100 %] 100 % (09/01 2114) Weight:  [100.2 kg] 100.2 kg (09/01 0916) Last BM Date: 12/24/20 General:   Alert,  Well-developed, well-nourished, pleasant and cooperative in NAD Head:  Normocephalic and atraumatic. Eyes:  Sclera clear, no icterus.   Conjunctiva pink. Ears:  Normal auditory acuity. Nose:  No deformity, discharge,  or lesions. Mouth:  No deformity or lesions, dentition normal. Neck:  Supple; no masses or thyromegaly. JVP not elevated Lungs:  Clear throughout to auscultation.    No wheezes, crackles, or rhonchi. No acute distress. Heart:  Regular rate and rhythm; no murmurs, clicks, rubs,  or gallops. Abdomen:  Soft, nontender and nondistended. No masses, hepatosplenomegaly or hernias noted. Normal bowel sounds, without guarding, and without rebound.   Msk:  Symmetrical without gross deformities. Normal posture. Pulses: Left AV fistula with thrill. Extremities:  Without clubbing or edema. Neurologic:  Alert and  oriented x4;  grossly normal neurologically. Skin:  Intact without significant lesions or rashes.  Ulceration left hand Cervical Nodes:  No significant cervical adenopathy. Psych:  Alert and cooperative. Normal mood and affect.  Intake/Output from previous day: 09/01 0701 - 09/02 0700 In: 200 [IV Piggyback:200] Out: -  Intake/Output this shift: No intake/output data recorded.  Lab Results: Recent Labs    12/24/20 1013 12/25/20 0214  WBC 5.5 5.3  HGB 11.3* 10.3*  HCT 35.0* 32.0*  PLT 126* PLATELET CLUMPS NOTED ON SMEAR, UNABLE TO ESTIMATE   BMET Recent Labs    12/24/20 1013 12/25/20 0214  NA 131* 133*  K 3.5 3.2*  CL 92* 94*  CO2 27 25  GLUCOSE 167* 151*  BUN 46* 57*  CREATININE 6.55* 7.69*  CALCIUM 8.6* 8.5*  PHOS  --  6.1*   LFT Recent Labs    12/25/20 0214  PROT 6.2*  ALBUMIN 3.1*  AST 46*  ALT 40  ALKPHOS 46  BILITOT 0.7   PT/INR Recent Labs    12/24/20 1013  LABPROT 13.6  INR 1.0   Hepatitis Panel No results for input(s): HEPBSAG, HCVAB, HEPAIGM, HEPBIGM in the last 72 hours.  Studies/Results: DG Chest Port 1 View  Result Date: 12/24/2020 CLINICAL DATA:  Low-grade fever and shaking during dialysis EXAM: PORTABLE CHEST 1 VIEW COMPARISON:  Chest radiograph 09/03/2020 FINDINGS: The heart is enlarged, unchanged. The mediastinal contours are stable. A right sided dialysis catheter is in stable position terminating in the right atrium. There  is no focal consolidation or pulmonary edema. There is no pleural effusion or  pneumothorax. There is no acute osseous abnormality. IMPRESSION: 1. No radiographic evidence of acute cardiopulmonary process. 2. Unchanged cardiomegaly. Electronically Signed   By: Valetta Mole M.D.   On: 12/24/2020 10:38    Assessment/Plan: ESRD-Monday Wednesday Friday dialysis [facility].  We will continue dialysis ANEMIA-stable at this point. 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  We will continue binders check renal panel on daily basis HTN/VOL-appears euvolemic continue dialysis ACCESS-AV fistula with steal syndrome now dialyzing with IJ catheter Diabetes mellitus as per primary service   LOS: [place] '@TODAY''@8'$ :22 AM

## 2020-12-25 NOTE — Progress Notes (Signed)
Initial Nutrition Assessment  DOCUMENTATION CODES:   Obesity unspecified  INTERVENTION:   -Once diet is advanced, add:  -Nepro Shake po BID, each supplement provides 425 kcal and 19 grams protein  -Renal MVI with minerals daily  NUTRITION DIAGNOSIS:   Increased nutrient needs related to wound healing as evidenced by estimated needs.  GOAL:   Patient will meet greater than or equal to 90% of their needs  MONITOR:   PO intake, Supplement acceptance, Diet advancement, Labs, Weight trends, Skin, I & O's  REASON FOR ASSESSMENT:   Malnutrition Screening Tool    ASSESSMENT:   Kevin Meyer is a 50 y.o. WM PMHx essential HTN, HLD, OA, gout, anemia, DM type II controlled with complication, diabetic nephropathy, ESRD on HD M/W/F, S/P   right sided tunneled cath, left AV fistula.obesity, chronic systolic CHF (mild)  Pt admitted with lt finger infection.   9/2- s/p Conscious sedation; Ultrasound-guided access to the right common femoral artery; Arch aortogram; Selective catheterization of the left brachial artery with left upper extremity runoff; Mynx closure right common femoral artery  Reviewed I/O: +200 ml x 24 hours  Pt out of room at time of visit. He is currently NPO for procedure. RD unable to speak with pt or obtain nutrition-focused physical exam at this time.   Unsure of EDW. Reviewed wt hx; pt has experienced a 14.8% wt loss over the past 6 months, which is significant for time frame. Suspect fluid changes from HD may be contributing towards wt loss.   Pt with increased nutritional needs and would benefit from addition of oral nutrition supplements.   Medications reviewed and include vitamin D3, folvite, and vitamin B-12.   Lab Results  Component Value Date   HGBA1C 7.2 (H) 12/24/2020   PTA DM medications are 10 mg glipizide BID and 0.75 duaglutide daily.   Labs reviewed: Na: 133, K: 3.2, Phos: 6.1, CBGS: 110-156 (inpatient orders for glycemic control are 0-6 units  inuslin aspart every 4 hours).    Diet Order:   Diet Order             Diet NPO time specified  Diet effective midnight                   EDUCATION NEEDS:   Education needs have been addressed  Skin:  Skin Assessment: Reviewed RN Assessment  Last BM:  12/24/20  Height:   Ht Readings from Last 1 Encounters:  12/24/20 '5\' 6"'$  (1.676 m)    Weight:   Wt Readings from Last 1 Encounters:  12/24/20 100.2 kg    Ideal Body Weight:  64.5 kg  BMI:  Body mass index is 35.67 kg/m.  Estimated Nutritional Needs:   Kcal:  1900-2100  Protein:  95-110 grams  Fluid:  1000 ml + UOP    Loistine Chance, RD, LDN, [place] Registered Dietitian II Certified Diabetes Care and Education Specialist Please refer to [facility] for RD and/or RD on-call/weekend/after hours pager

## 2020-12-25 NOTE — Progress Notes (Addendum)
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  Patient down for angiogram and then vein mapping and then will need vascular procedure done.  Patient is on broad-spectrum antibiotics including Flagyl, cefepime and vancomycin.  Discussed with Dr. Scot Dock of vascular surgery, he recommends that Keflex p.o. is just fine, will discontinue all IV antibiotics and transition to Keflex.  Appreciate vascular surgery help.  ESRD on HD: Nephrology consulted.  Hypokalemia: We will defer to nephrology since he is a dialysis patient.  Essential hypertension: Controlled.  Continue current medications.  Type 2 diabetes mellitus with nephropathy: Hemoglobin A1c 7.2.  Continue SSI.  Hyperlipidemia: Continue statin.  Class II obesity: Counseling for weight loss provided.  DVT prophylaxis: SCDs Start: 12/24/20 1426   Code Status: Full Code  Family Communication:  None present at bedside.  Plan of care discussed with patient in length and he verbalized understanding and agreed with it.  Status is: Inpatient  Remains inpatient appropriate because:Ongoing diagnostic testing needed not appropriate for outpatient work up  Dispo: The patient is from: Home              Anticipated d/c is to: Home              Patient currently is not medically stable to d/c.   Difficult to place patient No        Estimated body mass index is 35.67  kg/m as calculated from the following:   Height as of this encounter: '5\' 6"'$  (1.676 m).   Weight as of this encounter: 100.2 kg.     Nutritional Assessment: Body mass index is 35.67 kg/m.Marland Kitchen Seen by dietician.  I agree with the assessment and plan as outlined below: Nutrition Status:        .  Skin Assessment: I have examined the patient's skin and I agree with the wound assessment as performed by the wound care RN as outlined below:    Consultants:  Vascular surgery  Procedures:  None  Antimicrobials:  Anti-infectives (From admission, onward)    Start     Dose/Rate Route Frequency Ordered Stop   12/25/20 1600  [MAR Hold]  ceFEPIme (MAXIPIME) 2 g in sodium chloride 0.9 % 100 mL IVPB        (MAR Hold since Fri 12/25/2020 at 1045.Hold Reason: Transfer to a Procedural area)   2 g 200 mL/hr over 30 Minutes Intravenous Every M-W-F (Hemodialysis) 12/24/20 1233     12/25/20 1600  [MAR Hold]  vancomycin (VANCOCIN) IVPB 1000 mg/200 mL premix        (MAR Hold since Fri 12/25/2020 at 1045.Hold Reason: Transfer to a Procedural area)   1,000 mg 200 mL/hr over 60 Minutes Intravenous Every M-W-F (Hemodialysis) 12/24/20 1233     12/24/20 1000  vancomycin (VANCOREADY) IVPB 2000 mg/400 mL        2,000 mg 200 mL/hr over 120 Minutes Intravenous  Once  12/24/20 0946 12/24/20 1455   12/24/20 0945  ceFEPIme (MAXIPIME) 2 g in sodium chloride 0.9 % 100 mL IVPB        2 g 200 mL/hr over 30 Minutes Intravenous  Once 12/24/20 0941 12/24/20 1133   12/24/20 0945  vancomycin (VANCOCIN) IVPB 1000 mg/200 mL premix  Status:  Discontinued        1,000 mg 200 mL/hr over 60 Minutes Intravenous  Once 12/24/20 0941 12/24/20 0946   12/24/20 0945  [MAR Hold]  metroNIDAZOLE (FLAGYL) IVPB 500 mg        (MAR Hold since Fri 12/25/2020 at 1045.Hold Reason: Transfer to a Procedural area)   500 mg 100 mL/hr over 60 Minutes Intravenous Every 12 hours 12/24/20 0941            Subjective: Seen and examined.  Still  complains of some pain and numbness in the fingers.  Has been going on since July since his brachiocephalic AV fistula is being used for his dialysis.  Objective: Vitals:   12/24/20 1327 12/24/20 1700 12/24/20 2114 12/25/20 0845  BP: 127/68 (!) 149/77 117/71 139/74  Pulse: 86 91 90 80  Resp: '20 18 18 17  '$ Temp:  98.6 F (37 C) 98.6 F (37 C) 98.7 F (37.1 C)  TempSrc:  Oral Oral Oral  SpO2: 98% 97% 100% 98%  Weight:      Height:        Intake/Output Summary (Last 24 hours) at 12/25/2020 1050 Last data filed at 12/24/2020 1204 Gross per 24 hour  Intake 200 ml  Output --  Net 200 ml   Filed Weights   12/24/20 0916  Weight: 100.2 kg    Examination:  General exam: Appears calm and comfortable  Respiratory system: Clear to auscultation. Respiratory effort normal. Cardiovascular system: S1 & S2 heard, RRR. No JVD, murmurs, rubs, gallops or clicks. No pedal edema. Gastrointestinal system: Abdomen is nondistended, soft and nontender. No organomegaly or masses felt. Normal bowel sounds heard. Central nervous system: Alert and oriented. No focal neurological deficits. Extremities: Symmetric 5 x 5 power. Skin: Ulceration and mild erythema of the left fifth finger, ulceration and some superficial necrosis of the left index finger and some ulceration of the left thumb. Psychiatry: Judgement and insight appear normal. Mood & affect appropriate.    Data Reviewed: I have personally reviewed following labs and imaging studies  CBC: Recent Labs  Lab 12/24/20 1013 12/25/20 0214  WBC 5.5 5.3  NEUTROABS 3.5 2.8  HGB 11.3* 10.3*  HCT 35.0* 32.0*  MCV 93.3 92.2  PLT 126* PLATELET CLUMPS NOTED ON SMEAR, UNABLE TO ESTIMATE   Basic Metabolic Panel: Recent Labs  Lab 12/24/20 1013 12/25/20 0214  NA 131* 133*  K 3.5 3.2*  CL 92* 94*  CO2 27 25  GLUCOSE 167* 151*  BUN 46* 57*  CREATININE 6.55* 7.69*  CALCIUM 8.6* 8.5*  MG  --  2.1  PHOS  --  6.1*   GFR: Estimated Creatinine  Clearance: 12.7 mL/min (A) (by C-G formula based on SCr of 7.69 mg/dL (H)). Liver Function Tests: Recent Labs  Lab 12/24/20 1013 12/25/20 0214  AST 54* 46*  ALT 45* 40  ALKPHOS 58 46  BILITOT 0.5 0.7  PROT 7.2 6.2*  ALBUMIN 3.7 3.1*   No results for input(s): LIPASE, AMYLASE in the last 168 hours. No results for input(s): AMMONIA in the last 168 hours. Coagulation Profile: Recent Labs  Lab 12/24/20 1013  INR 1.0   Cardiac Enzymes: No  results for input(s): CKTOTAL, CKMB, CKMBINDEX, TROPONINI in the last 168 hours. BNP (last 3 results) No results for input(s): PROBNP in the last 8760 hours. HbA1C: Recent Labs    12/24/20 1716  HGBA1C 7.2*   CBG: Recent Labs  Lab 12/24/20 1953 12/25/20 0123 12/25/20 0540 12/25/20 0847  GLUCAP 139* 156* 110* 114*   Lipid Profile: No results for input(s): CHOL, HDL, LDLCALC, TRIG, CHOLHDL, LDLDIRECT in the last 72 hours. Thyroid Function Tests: No results for input(s): TSH, T4TOTAL, FREET4, T3FREE, THYROIDAB in the last 72 hours. Anemia Panel: No results for input(s): VITAMINB12, FOLATE, FERRITIN, TIBC, IRON, RETICCTPCT in the last 72 hours. Sepsis Labs: Recent Labs  Lab 12/24/20 1013 12/24/20 1143  LATICACIDVEN 1.5 1.3    Recent Results (from the past 240 hour(s))  Blood Culture (routine x 2)     Status: None (Preliminary result)   Collection Time: 12/24/20 10:13 AM   Specimen: BLOOD RIGHT WRIST  Result Value Ref Range Status   Specimen Description BLOOD RIGHT WRIST  Final   Special Requests   Final    BOTTLES DRAWN AEROBIC AND ANAEROBIC Blood Culture adequate volume   Culture   Final    NO GROWTH < 24 HOURS Performed at [facility], [address] [place], [place] [address]    Report Status PENDING  Incomplete  Blood Culture (routine x 2)     Status: None (Preliminary result)   Collection Time: 12/24/20 10:14 AM   Specimen: Right Antecubital; Blood  Result Value Ref Range Status   Specimen Description RIGHT  ANTECUBITAL  Final   Special Requests   Final    BOTTLES DRAWN AEROBIC AND ANAEROBIC Blood Culture adequate volume   Culture   Final    NO GROWTH < 24 HOURS Performed at [facility], [address] [place], [place] [address]    Report Status PENDING  Incomplete  Resp Panel by RT-PCR (Flu A&B, Covid) Nasopharyngeal Swab     Status: None   Collection Time: 12/24/20 11:08 AM   Specimen: Nasopharyngeal Swab; Nasopharyngeal(NP) swabs in vial transport medium  Result Value Ref Range Status   SARS Coronavirus 2 by RT PCR NEGATIVE NEGATIVE Final    Comment: (NOTE) SARS-CoV-2 target nucleic acids are NOT DETECTED.  The SARS-CoV-2 RNA is generally detectable in upper respiratory specimens during the acute phase of infection. The lowest concentration of SARS-CoV-2 viral copies this assay can detect is 138 copies/mL. A negative result does not preclude SARS-Cov-2 infection and should not be used as the sole basis for treatment or other patient management decisions. A negative result may occur with  improper specimen collection/handling, submission of specimen other than nasopharyngeal swab, presence of viral mutation(s) within the areas targeted by this assay, and inadequate number of viral copies(<138 copies/mL). A negative result must be combined with clinical observations, patient history, and epidemiological information. The expected result is Negative.  Fact Sheet for Patients:  EntrepreneurPulse.com.au  Fact Sheet for Healthcare Providers:  IncredibleEmployment.be  This test is no t yet approved or cleared by the [place] FDA and  has been authorized for detection and/or diagnosis of SARS-CoV-2 by FDA under an Emergency Use Authorization (EUA). This EUA will remain  in effect (meaning this test can be used) for the duration of the COVID-19 declaration under Section 564(b)(1) of the Act, 21 U.S.C.section 360bbb-3(b)(1), unless the  authorization is terminated  or revoked sooner.       Influenza A by PCR NEGATIVE NEGATIVE Final   Influenza B  by PCR NEGATIVE NEGATIVE Final    Comment: (NOTE) The Xpert Xpress SARS-CoV-2/FLU/RSV plus assay is intended as an aid in the diagnosis of influenza from Nasopharyngeal swab specimens and should not be used as a sole basis for treatment. Nasal washings and aspirates are unacceptable for Xpert Xpress SARS-CoV-2/FLU/RSV testing.  Fact Sheet for Patients: EntrepreneurPulse.com.au  Fact Sheet for Healthcare Providers: IncredibleEmployment.be  This test is not yet approved or cleared by the [place] FDA and has been authorized for detection and/or diagnosis of SARS-CoV-2 by FDA under an Emergency Use Authorization (EUA). This EUA will remain in effect (meaning this test can be used) for the duration of the COVID-19 declaration under Section 564(b)(1) of the Act, 21 U.S.C. section 360bbb-3(b)(1), unless the authorization is terminated or revoked.  Performed at [facility], [address], [place], [place] [address]       Radiology Studies: [facility] Chest [facility] 1 View  Result Date: 12/24/2020 CLINICAL DATA:  Low-grade fever and shaking during dialysis EXAM: PORTABLE CHEST 1 VIEW COMPARISON:  Chest radiograph 09/03/2020 FINDINGS: The heart is enlarged, unchanged. The mediastinal contours are stable. A right sided dialysis catheter is in stable position terminating in the right atrium. There is no focal consolidation or pulmonary edema. There is no pleural effusion or pneumothorax. There is no acute osseous abnormality. IMPRESSION: 1. No radiographic evidence of acute cardiopulmonary process. 2. Unchanged cardiomegaly. Electronically Signed   By: Valetta Mole M.D.   On: 12/24/2020 10:38    Scheduled Meds:  [MAR Hold] allopurinol  100 mg Oral Daily   [MAR Hold] aspirin EC  81 mg Oral Daily   [MAR Hold] atorvastatin  40 mg Oral QHS   [MAR Hold]  Chlorhexidine Gluconate Cloth  6 each Topical Q0600   [MAR Hold] cholecalciferol  2,000 Units Oral Daily   [MAR Hold] folic acid  1 mg Oral Daily   [MAR Hold] insulin aspart  0-6 Units Subcutaneous Q4H   [MAR Hold] cyanocobalamin  1,000 mcg Oral Daily   Continuous Infusions:  [MAR Hold] ceFEPime (MAXIPIME) IV     [MAR Hold] metronidazole 500 mg (12/24/20 2122)   [MAR Hold] vancomycin       LOS: 1 day   Time spent: 35 minutes   Darliss Cheney, MD [facility]  12/25/2020, 10:50 AM   How to contact the [facility] Attending or Consulting provider [place] or covering provider during after hours [place], for this patient?  Check the care team in [facility] and look for a) attending/consulting TR[facility] provider listed and b) the [facility] team listed. Page or secure chat [phone]. Log into [email or web address] and use 's universal password to access. If you do not have the password, please contact the hospital operator. Locate the [facility] provider you are looking for under [facility] and page to a number that you can be directly reached. If you still have difficulty reaching the provider, please page the [facility] (Director on Call) for the Hospitalists listed on amion for assistance.

## 2020-12-26 DIAGNOSIS — E118 Type 2 diabetes mellitus with unspecified complications: Secondary | ICD-10-CM | POA: Diagnosis not present

## 2020-12-26 LAB — COMPREHENSIVE METABOLIC PANEL
ALT: 33 U/L (ref 0–44)
AST: 33 U/L (ref 15–41)
Albumin: 3.1 g/dL — ABNORMAL LOW (ref 3.5–5.0)
Alkaline Phosphatase: 49 U/L (ref 38–126)
Anion gap: 15 (ref 5–15)
BUN: 69 mg/dL — ABNORMAL HIGH (ref 6–20)
CO2: 23 mmol/L (ref 22–32)
Calcium: 8.7 mg/dL — ABNORMAL LOW (ref 8.9–10.3)
Chloride: 97 mmol/L — ABNORMAL LOW (ref 98–111)
Creatinine, Ser: 8.92 mg/dL — ABNORMAL HIGH (ref 0.61–1.24)
GFR, Estimated: 7 mL/min — ABNORMAL LOW (ref >60)
Glucose, Bld: 87 mg/dL (ref 70–99)
Potassium: 3.4 mmol/L — ABNORMAL LOW (ref 3.5–5.1)
Sodium: 135 mmol/L (ref 135–145)
Total Bilirubin: 0.7 mg/dL (ref 0.3–1.2)
Total Protein: 6.3 g/dL — ABNORMAL LOW (ref 6.5–8.1)

## 2020-12-26 LAB — GLUCOSE, CAPILLARY
Glucose-Capillary: 136 mg/dL — ABNORMAL HIGH (ref 70–99)
Glucose-Capillary: 187 mg/dL — ABNORMAL HIGH (ref 70–99)
Glucose-Capillary: 192 mg/dL — ABNORMAL HIGH (ref 70–99)
Glucose-Capillary: 316 mg/dL — ABNORMAL HIGH (ref 70–99)
Glucose-Capillary: 70 mg/dL (ref 70–99)
Glucose-Capillary: 96 mg/dL (ref 70–99)

## 2020-12-26 LAB — CBC WITH DIFFERENTIAL/PLATELET
Abs Immature Granulocytes: 0.01 10*3/uL (ref 0.00–0.07)
Basophils Absolute: 0 10*3/uL (ref 0.0–0.1)
Basophils Relative: 0 %
Eosinophils Absolute: 0.2 10*3/uL (ref 0.0–0.5)
Eosinophils Relative: 3 %
HCT: 31.8 % — ABNORMAL LOW (ref 39.0–52.0)
Hemoglobin: 10.2 g/dL — ABNORMAL LOW (ref 13.0–17.0)
Immature Granulocytes: 0 %
Lymphocytes Relative: 29 %
Lymphs Abs: 1.6 10*3/uL (ref 0.7–4.0)
MCH: 29.5 pg (ref 26.0–34.0)
MCHC: 32.1 g/dL (ref 30.0–36.0)
MCV: 91.9 fL (ref 80.0–100.0)
Monocytes Absolute: 0.5 10*3/uL (ref 0.1–1.0)
Monocytes Relative: 9 %
Neutro Abs: 3.1 10*3/uL (ref 1.7–7.7)
Neutrophils Relative %: 59 %
Platelets: 132 10*3/uL — ABNORMAL LOW (ref 150–400)
RBC: 3.46 MIL/uL — ABNORMAL LOW (ref 4.22–5.81)
RDW: 15.7 % — ABNORMAL HIGH (ref 11.5–15.5)
WBC: 5.3 10*3/uL (ref 4.0–10.5)
nRBC: 0 % (ref 0.0–0.2)

## 2020-12-26 LAB — HEPATITIS B SURFACE ANTIGEN: Hepatitis B Surface Ag: NONREACTIVE

## 2020-12-26 LAB — MAGNESIUM: Magnesium: 2.2 mg/dL (ref 1.7–2.4)

## 2020-12-26 LAB — LACTIC ACID, PLASMA: Lactic Acid, Venous: 1.2 mmol/L (ref 0.5–1.9)

## 2020-12-26 LAB — PHOSPHORUS: Phosphorus: 6.3 mg/dL — ABNORMAL HIGH (ref 2.5–4.6)

## 2020-12-26 MED ORDER — VANCOMYCIN HCL IN DEXTROSE 1-5 GM/200ML-% IV SOLN
1000.0000 mg | Freq: Once | INTRAVENOUS | Status: AC
  Administered 2020-12-26: 1000 mg via INTRAVENOUS
  Filled 2020-12-26: qty 200

## 2020-12-26 MED ORDER — SUCROFERRIC OXYHYDROXIDE 500 MG PO CHEW
500.0000 mg | CHEWABLE_TABLET | Freq: Three times a day (TID) | ORAL | Status: DC
  Filled 2020-12-26 (×2): qty 1

## 2020-12-26 MED ORDER — SUCROFERRIC OXYHYDROXIDE 500 MG PO CHEW
1000.0000 mg | CHEWABLE_TABLET | Freq: Three times a day (TID) | ORAL | Status: DC
  Administered 2020-12-26 – 2020-12-27 (×5): 500 mg via ORAL
  Administered 2020-12-28 – 2020-12-31 (×9): 1000 mg via ORAL
  Filled 2020-12-26 (×18): qty 2

## 2020-12-26 NOTE — Progress Notes (Signed)
Pt currently in dialysis

## 2020-12-26 NOTE — Progress Notes (Signed)
   12/26/20 1123  Vitals  Temp 99 F (37.2 C)  Temp Source Oral  BP 124/76  BP Location Right Arm  BP Method Automatic  Patient Position (if appropriate) Lying  Pulse Rate (!) 105 (pt dry heaving)  Pulse Rate Source Monitor  Resp (!) 22  Oxygen Therapy  SpO2 98 %  O2 Device Room Air  HD tx terminated with 10 minutes left. Pt noted to be dry heaving, reporting chills. Pt reports that this has happened his last two previous hd txs and usually begans earlier in tx. Pt noted with SBP running in high 80s-90s throughout tx. UF intermittently paused and then ultimately decreased. NS 100 ml given per bolus with temporary improvement in BP. Pt asymptomatic until last 20 minutes of tx. Pt given IV zofran and po oxycodone '5mg'$  for c/o lower back pain-minimal effects noted. Pt states "I usually get like this earlier, maybe it is that antibiotic you are giving me". Antibiotic stopped as well at that time. Janyth Pupa on unit and made aware of vital signs and patient's complaints, as well as reports of possible medication reaction. No further orders given at this time. Pt noted to be moderately calm and stable upon transfer back to unit. Reports minimal decrease of back pain, however dry heaving has improved. Pt's CBG-136.

## 2020-12-26 NOTE — Progress Notes (Signed)
[facility] Progress Note   Subjective: Seen on HD - 3L UFG and tolerating. No CP or dyspnea, no further fever or chills. S/p angiography yesterday to determine plan for steal syndrome L arm - possible DRIL procedure per notes.  Objective Vitals:   12/26/20 0715 12/26/20 0730 12/26/20 0800 12/26/20 0830  BP: 123/70 (!) 96/54 (!) 92/49 (!) 103/49  Pulse: 80 82 86 90  Resp: 18 (!) 22 (!) 22 (!) 21  Temp:      TempSrc:      SpO2:      Weight:      Height:       Physical Exam General: Well appearing man, NAD Heart: RRR; no murmur Lungs: CTA, no rales Abdomen: soft Extremities: Trace BLE edema Dialysis Access: TDC in R chest, LUE AVF + bruit, ischemic ulcers to L 2nd/5th fingers  Additional Objective Labs: Basic Metabolic Panel: Recent Labs  Lab 12/25/20 0214 12/25/20 1544 12/26/20 0717  NA 133* 134* 135  K 3.2* 3.8 3.4*  CL 94* 95* 97*  CO2 25 20* 23  GLUCOSE 151* 182* 87  BUN 57* 65* 69*  CREATININE 7.69* 8.16*  7.95* 8.92*  CALCIUM 8.5* 8.8* 8.7*  PHOS 6.1* 6.4* 6.3*   Liver Function Tests: Recent Labs  Lab 12/24/20 1013 12/25/20 0214 12/25/20 1544 12/26/20 0717  AST 54* 46*  --  33  ALT 45* 40  --  33  ALKPHOS 58 46  --  49  BILITOT 0.5 0.7  --  0.7  PROT 7.2 6.2*  --  6.3*  ALBUMIN 3.7 3.1* 3.4* 3.1*   CBC: Recent Labs  Lab 12/24/20 1013 12/25/20 0214 12/25/20 1544 12/26/20 0717  WBC 5.5 5.3 5.8 5.3  NEUTROABS 3.5 2.8  --  3.1  HGB 11.3* 10.3* 11.8* 10.2*  HCT 35.0* 32.0* 35.9* 31.8*  MCV 93.3 92.2 91.1 91.9  PLT 126* PLATELET CLUMPS NOTED ON SMEAR, UNABLE TO ESTIMATE 124* 132*   Blood Culture    Component Value Date/Time   SDES RIGHT ANTECUBITAL 12/24/2020 1014   SPECREQUEST  12/24/2020 1014    BOTTLES DRAWN AEROBIC AND ANAEROBIC Blood Culture adequate volume   CULT  12/24/2020 1014    NO GROWTH 2 DAYS Performed at [facility], [address], [place], [place] [address]    REPTSTATUS PENDING 12/24/2020 1014    Studies/Results: PERIPHERAL VASCULAR CATHETERIZATION  Result Date: 12/25/2020 Table formatting from the original result was not included. PATIENT: Kevin Meyer      MRN: [id number] DOB: 1970/06/02    DATE OF PROCEDURE: 12/25/2020 INDICATIONS:  Kevin Meyer is a 50 y.o. male who had a left brachiocephalic fistula placed earlier this year.  He had significant steal symptoms and was seen in consultation.  He is being considered for a DRIL procedure and was set up for an upper extremity arteriogram PROCEDURE:  Conscious sedation Ultrasound-guided access to the right common femoral artery Arch aortogram Selective catheterization of the left brachial artery with left upper extremity runoff Mynx closure right common femoral artery SURGEON: Judeth Cornfield. Scot Dock, MD, FACS ANESTHESIA: Local with sedation EBL: Minimal TECHNIQUE: The patient was brought to the peripheral vascular lab and was sedated. The period of conscious sedation was 47 minutes.  During that time period, I was present face-to-face 100% of the time.  The patient was administered 1 mg of Versed and 50 mcg of fentanyl. The patient's heart rate, blood pressure, and oxygen saturation were monitored by the nurse continuously during the procedure. Both groins  were prepped and draped in the usual sterile fashion.  Under ultrasound guidance, after the skin was anesthetized, I cannulated the right common femoral artery with a micropuncture needle and a micropuncture sheath was introduced over a wire.  This was exchanged for a 5 [place] sheath over a Bentson wire.  By ultrasound the femoral artery was patent. A real-time image was obtained and sent to the server. A pigtail catheter was positioned in the ascending aorta and an arch aortogram was obtained in a 40 degree LAO projection.  I then selected the left subclavian artery and advanced the wire down past the vertebral into the brachial artery.  Selective left upper extremity arteriogram was then obtained.  We also  obtain films with compression of the fistula.  At the completion the catheter was removed.  The Mynx device was passed through the sheath and then deployed in the standard fashion.  There was good hemostasis. FINDINGS: No significant disease of the aortic arch, innominate, left proximal common carotid artery, and left proximal subclavian artery. The left subclavian, axillary, and brachial arteries are widely patent. The fistula is patent with several large competing branches.  There is minimal narrowing at the proximal end of the fistula. There is an early takeoff of the radial artery just beyond the anastomosis.  The radial artery is somewhat small with some moderate diffuse disease.  It fills the palmar arch in the hand. There is an interosseous branch which occludes above the wrist.  The ulnar artery is patent to the hand.   DG Chest Port 1 View  Result Date: 12/24/2020 CLINICAL DATA:  Low-grade fever and shaking during dialysis EXAM: PORTABLE CHEST 1 VIEW COMPARISON:  Chest radiograph 09/03/2020 FINDINGS: The heart is enlarged, unchanged. The mediastinal contours are stable. A right sided dialysis catheter is in stable position terminating in the right atrium. There is no focal consolidation or pulmonary edema. There is no pleural effusion or pneumothorax. There is no acute osseous abnormality. IMPRESSION: 1. No radiographic evidence of acute cardiopulmonary process. 2. Unchanged cardiomegaly. Electronically Signed   By: Valetta Mole M.D.   On: 12/24/2020 10:38   VAS [place] LOWER EXTREMITY SAPHENOUS VEIN MAPPING  Result Date: 12/25/2020 LOWER EXTREMITY VEIN MAPPING Patient Name:  Kevin Meyer  Date of Exam:   12/25/2020 Medical Rec #: [id number]    Accession #:    [id number] Date of Birth: May 31, 1970     Patient Gender: M Patient Age:   50 years Exam [facility]  [facility] Procedure:      VAS [place] LOWER EXTREMITY [place] Referring Phys: Jamelle Haring  --------------------------------------------------------------------------------  Indications:  Ulceration LT hand, pre-op evaluation for DRIL procedure Risk Factors: Hypertension, hyperlipidemia, Diabetes.  Comparison Study: No prior studies. Performing Technologist: Darlin Coco RDMS, RVT  Examination Guidelines: A complete evaluation includes B-mode imaging, spectral Doppler, color Doppler, and power Doppler as needed of all accessible portions of each vessel. Bilateral testing is considered an integral part of a complete examination. Limited examinations for reoccurring indications may be performed as noted. +---------------+-----------+----------------------+---------------+-----------+   RT Diameter  RT Findings         GSV            LT Diameter  LT Findings      (cm)                                            (  cm)                  +---------------+-----------+----------------------+---------------+-----------+      0.60                     Saphenofemoral         0.53                                                   Junction                                  +---------------+-----------+----------------------+---------------+-----------+      0.43                     Proximal thigh         0.32                  +---------------+-----------+----------------------+---------------+-----------+      0.26       branching       Mid thigh            0.26                  +---------------+-----------+----------------------+---------------+-----------+      0.28                      Distal thigh          0.26                  +---------------+-----------+----------------------+---------------+-----------+      0.29                          Knee              0.29                  +---------------+-----------+----------------------+---------------+-----------+      0.25                       Prox calf            0.23       branching   +---------------+-----------+----------------------+---------------+-----------+      0.26       branching        Mid calf            0.24                  +---------------+-----------+----------------------+---------------+-----------+      0.26                      Distal calf           0.27                  +---------------+-----------+----------------------+---------------+-----------+      0.24                         Ankle              0.23                  +---------------+-----------+----------------------+---------------+-----------+ Diagnosing physician: Deitra Mayo MD Electronically signed by  Deitra Mayo MD on 12/25/2020 at 6:32:49 PM.    Final     Medications:  sodium chloride     sodium chloride     sodium chloride     vancomycin     vancomycin      allopurinol  100 mg Oral Daily   aspirin EC  81 mg Oral Daily   atorvastatin  40 mg Oral QHS   cephALEXin  500 mg Oral Q12H   Chlorhexidine Gluconate Cloth  6 each Topical Q0600   cholecalciferol  2,000 Units Oral Daily   feeding supplement (NEPRO CARB STEADY)  237 mL Oral BID BM   folic acid  1 mg Oral Daily   heparin  5,000 Units Subcutaneous Q8H   insulin aspart  0-6 Units Subcutaneous Q4H   multivitamin  1 tablet Oral QHS   sodium chloride flush  3 mL Intravenous Q12H   sucroferric oxyhydroxide  500 mg Oral TID WC   cyanocobalamin  1,000 mcg Oral Daily   Dialysis Orders: MWF at [facility] 4hr, 400/600, EDW 99.5kg, 2K/2.5Ca, AVF + TDC, heparin 5000 - Calcitriol 0.63mg PO q HD - No ESA  Assessment/Plan: 1. Chills/rigors: Blood Cx 9/1 negative. CXR clear. No further symptoms. 2. 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 On PO Keflex. 3. ESRD: HD today, as rollover from yesterday. Usual MWF schedule. 3K bath for mild hypoK. 4. HTN/volume: BP low/stable, follow. 5. Anemia: Hgb 10.2, no ESA for now. 6. Secondary  hyperparathyroidism: Ca ok, Phos high - continue Velphoro, will ^ dose. 7. Nutrition: Alb low, continue protein supplements. 8. T2DM   KVeneta Penton PA-C 12/26/2020, 8:43 AM  [employer]

## 2020-12-26 NOTE — Progress Notes (Signed)
   VASCULAR SURGERY ASSESSMENT & PLAN:   STEAL SYNDROME LEFT UPPER EXTREMITY: The patient was seen in consultation by Dr. Stanford Breed with steal syndrome of the left upper extremity.  He has nonhealing wounds on the left hand.  He is undergone preoperative arteriography and vein mapping and is scheduled for a DRIL procedure on Wednesday of next week.  The only other option would be ligation of his fistula.    SUBJECTIVE:   No specific complaints.  PHYSICAL EXAM:   Vitals:   12/26/20 1255 12/26/20 1310 12/26/20 1441 12/26/20 1455  BP: (!) 93/50 112/69 (!) 89/51 (!) 88/60  Pulse: (!) 109  (!) 107 (!) 108  Resp: (!) '22  18 16  '$ Temp: (!) 100.6 F (38.1 C)  99.1 F (37.3 C) 98.3 F (36.8 C)  TempSrc: Oral  Oral Oral  SpO2:      Weight:      Height:       He has an excellent thrill in his left upper arm fistula. No hematoma right groin where he had his cath The wounds on his left hand are unchanged        LABS:   Lab Results  Component Value Date   WBC 5.3 12/26/2020   HGB 10.2 (L) 12/26/2020   HCT 31.8 (L) 12/26/2020   MCV 91.9 12/26/2020   PLT 132 (L) 12/26/2020   Lab Results  Component Value Date   CREATININE 8.92 (H) 12/26/2020   Lab Results  Component Value Date   INR 1.0 12/24/2020   CBG (last 3)  Recent Labs    12/26/20 0440 12/26/20 1117 12/26/20 1224  GLUCAP 70 136* 192*    PROBLEM LIST:    Active Problems:   Hypertension   Type 2 diabetes with nephropathy (HCC)   Class 2 obesity due to excess calories with body mass index (BMI) of 39.0 to 39.9 in adult   Hyperlipidemia   Osteoarthritis   Diabetes mellitus type 2, controlled, with complications (HCC)   Infection of finger LEFT   Finger infection   CURRENT MEDS:    allopurinol  100 mg Oral Daily   aspirin EC  81 mg Oral Daily   atorvastatin  40 mg Oral QHS   cephALEXin  500 mg Oral Q12H   Chlorhexidine Gluconate Cloth  6 each Topical Q0600   cholecalciferol  2,000 Units Oral Daily    feeding supplement (NEPRO CARB STEADY)  237 mL Oral BID BM   folic acid  1 mg Oral Daily   heparin  5,000 Units Subcutaneous Q8H   insulin aspart  0-6 Units Subcutaneous Q4H   multivitamin  1 tablet Oral QHS   sodium chloride flush  3 mL Intravenous Q12H   sucroferric oxyhydroxide  1,000 mg Oral TID WC   cyanocobalamin  1,000 mcg Oral Daily    Deitra Mayo Office: [phone] 12/26/2020

## 2020-12-26 NOTE — Progress Notes (Signed)
Vomiting and dry heaves during HD. IV Zofran being given.

## 2020-12-26 NOTE — Progress Notes (Signed)
PROGRESS NOTE    Kevin Meyer  [id number] DOB: 1970-12-13 DOA: 12/24/2020 PCP: Raiford Simmonds., PA-C   Brief Narrative:  Plan Kevin Meyer is a 50 year old gentleman with past history of essential hypertension, hyperlipidemia, OA, gout, anemia, type 2 diabetes mellitus, diabetic nephropathy, ESRD on HD MWF s/p right-sided tunneled catheter and left AV fistula, obesity, chronic systolic CHF [facility] with complaint of fever and left hand pain.  Was diagnosed with ulcers and vascular insufficiency as well as infection of the left fingers.  Transfer to [facility] for vascular consult.  Assessment & Plan:   Active Problems:   Hypertension   Type 2 diabetes with nephropathy (HCC)   Class 2 obesity due to excess calories with body mass index (BMI) of 39.0 to 39.9 in adult   Hyperlipidemia   Osteoarthritis   Diabetes mellitus type 2, controlled, with complications ([place])   Infection of finger LEFT   Finger infection  Left upper extremity brachiocephalic arteriovenous fistula causing steal syndrome with ulceration in the fingers and left hand/left finger cellulitis: Vascular surgery on board.  Patient underwent angiogram/aortogram and selective catheterization of the left brachial artery with left upper extremity runoff.  We mapping is done.  Further plans per vascular surgery.  Continue Keflex per vascular surgery recommendations.  Patient has not had any fever.   ESRD on HD: Nephrology consulted.  Hypokalemia: Improved, defer to nephrology since he is a dialysis patient.  Essential hypertension: Controlled.  Continue current medications.  Type 2 diabetes mellitus with nephropathy: Hemoglobin A1c 7.2.  Continue SSI.  Hyperlipidemia: Continue statin.  Class II obesity: Counseling for weight loss provided.  DVT prophylaxis: heparin injection 5,000 Units Start: 12/25/20 2200 SCDs Start: 12/24/20 1426   Code Status: Full Code  Family Communication:  None present at  bedside.  Plan of care discussed with patient in length and he verbalized understanding and agreed with it.  Status is: Inpatient  Remains inpatient appropriate because:Ongoing diagnostic testing needed not appropriate for outpatient work up  Dispo: The patient is from: Home              Anticipated d/c is to: Home              Patient currently is not medically stable to d/c.   Difficult to place patient No        Estimated body mass index is 35.97 kg/m as calculated from the following:   Height as of this encounter: '5\' 6"'$  (1.676 m).   Weight as of this encounter: 101.1 kg.     Nutritional Assessment: Body mass index is 35.97 kg/m.Marland Kitchen Seen by dietician.  I agree with the assessment and plan as outlined below: Nutrition Status: Nutrition Problem: Increased nutrient needs Etiology: wound healing Signs/Symptoms: estimated needs Interventions: Nepro shake, MVI  .  Skin Assessment: I have examined the patient's skin and I agree with the wound assessment as performed by the wound care RN as outlined below:    Consultants:  Vascular surgery  Procedures:  None  Antimicrobials:  Anti-infectives (From admission, onward)    Start     Dose/Rate Route Frequency Ordered Stop   12/26/20 0745  vancomycin (VANCOCIN) IVPB 1000 mg/200 mL premix        1,000 mg 200 mL/hr over 60 Minutes Intravenous  Once 12/26/20 0736 12/26/20 1030   12/25/20 2200  cephALEXin (KEFLEX) capsule 500 mg        500 mg Oral Every 12 hours 12/25/20 1056  12/25/20 1600  ceFEPIme (MAXIPIME) 2 g in sodium chloride 0.9 % 100 mL IVPB  Status:  Discontinued        2 g 200 mL/hr over 30 Minutes Intravenous Every M-W-F (Hemodialysis) 12/24/20 1233 12/25/20 1056   12/25/20 1600  vancomycin (VANCOCIN) IVPB 1000 mg/200 mL premix        1,000 mg 200 mL/hr over 60 Minutes Intravenous Every M-W-F (Hemodialysis) 12/24/20 1233     12/24/20 1000  vancomycin (VANCOREADY) IVPB 2000 mg/400 mL        2,000 mg 200  mL/hr over 120 Minutes Intravenous  Once 12/24/20 0946 12/24/20 1455   12/24/20 0945  ceFEPIme (MAXIPIME) 2 g in sodium chloride 0.9 % 100 mL IVPB        2 g 200 mL/hr over 30 Minutes Intravenous  Once 12/24/20 0941 12/24/20 1133   12/24/20 0945  vancomycin (VANCOCIN) IVPB 1000 mg/200 mL premix  Status:  Discontinued        1,000 mg 200 mL/hr over 60 Minutes Intravenous  Once 12/24/20 0941 12/24/20 0946   12/24/20 0945  metroNIDAZOLE (FLAGYL) IVPB 500 mg  Status:  Discontinued        500 mg 100 mL/hr over 60 Minutes Intravenous Every 12 hours 12/24/20 0941 12/25/20 1056          Subjective: Patient seen and examined in dialysis unit.  He has no new complaint.  Objective: Vitals:   12/26/20 1030 12/26/20 1038 12/26/20 1045 12/26/20 1123  BP: (!) 89/44 (!) 91/50 (!) 94/50 124/76  Pulse: 87 88 94 (!) 105  Resp: (!) 22 (!) 22 (!) 22 (!) 22  Temp:    99 F (37.2 C)  TempSrc:    Oral  SpO2:    98%  Weight:      Height:        Intake/Output Summary (Last 24 hours) at 12/26/2020 1204 Last data filed at 12/26/2020 1038 Gross per 24 hour  Intake 240 ml  Output 2094 ml  Net -1854 ml    Filed Weights   12/24/20 0916 12/25/20 1529 12/26/20 0705  Weight: 100.2 kg 99.6 kg 101.1 kg    Examination:  General exam: Appears calm and comfortable  Respiratory system: Clear to auscultation. Respiratory effort normal. Cardiovascular system: S1 & S2 heard, RRR. No JVD, murmurs, rubs, gallops or clicks. No pedal edema. Gastrointestinal system: Abdomen is nondistended, soft and nontender. No organomegaly or masses felt. Normal bowel sounds heard. Central nervous system: Alert and oriented. No focal neurological deficits. Extremities: Symmetric 5 x 5 power. Skin: Ulceration and mild erythema of the left fifth finger, ulceration and some superficial necrosis of the left index finger and some ulceration of the left thumb. Psychiatry: Judgement and insight appear normal. Mood & affect  appropriate.   Data Reviewed: I have personally reviewed following labs and imaging studies  CBC: Recent Labs  Lab 12/24/20 1013 12/25/20 0214 12/25/20 1544 12/26/20 0717  WBC 5.5 5.3 5.8 5.3  NEUTROABS 3.5 2.8  --  3.1  HGB 11.3* 10.3* 11.8* 10.2*  HCT 35.0* 32.0* 35.9* 31.8*  MCV 93.3 92.2 91.1 91.9  PLT 126* PLATELET CLUMPS NOTED ON SMEAR, UNABLE TO ESTIMATE 124* 132*    Basic Metabolic Panel: Recent Labs  Lab 12/24/20 1013 12/25/20 0214 12/25/20 1544 12/26/20 0717  NA 131* 133* 134* 135  K 3.5 3.2* 3.8 3.4*  CL 92* 94* 95* 97*  CO2 27 25 20* 23  GLUCOSE 167* 151* 182* 87  BUN 46* 57*  65* 69*  CREATININE 6.55* 7.69* 8.16*  7.95* 8.92*  CALCIUM 8.6* 8.5* 8.8* 8.7*  MG  --  2.1  --  2.2  PHOS  --  6.1* 6.4* 6.3*    GFR: Estimated Creatinine Clearance: 11 mL/min (A) (by C-G formula based on SCr of 8.92 mg/dL (H)). Liver Function Tests: Recent Labs  Lab 12/24/20 1013 12/25/20 0214 12/25/20 1544 12/26/20 0717  AST 54* 46*  --  33  ALT 45* 40  --  33  ALKPHOS 58 46  --  49  BILITOT 0.5 0.7  --  0.7  PROT 7.2 6.2*  --  6.3*  ALBUMIN 3.7 3.1* 3.4* 3.1*    No results for input(s): LIPASE, AMYLASE in the last 168 hours. No results for input(s): AMMONIA in the last 168 hours. Coagulation Profile: Recent Labs  Lab 12/24/20 1013  INR 1.0    Cardiac Enzymes: No results for input(s): CKTOTAL, CKMB, CKMBINDEX, TROPONINI in the last 168 hours. BNP (last 3 results) No results for input(s): PROBNP in the last 8760 hours. HbA1C: Recent Labs    12/24/20 1716  HGBA1C 7.2*    CBG: Recent Labs  Lab 12/25/20 1559 12/25/20 2031 12/26/20 0012 12/26/20 0440 12/26/20 1117  GLUCAP 171* 172* 96 70 136*    Lipid Profile: No results for input(s): CHOL, HDL, LDLCALC, TRIG, CHOLHDL, LDLDIRECT in the last 72 hours. Thyroid Function Tests: No results for input(s): TSH, T4TOTAL, FREET4, T3FREE, THYROIDAB in the last 72 hours. Anemia Panel: No results for  input(s): VITAMINB12, FOLATE, FERRITIN, TIBC, IRON, RETICCTPCT in the last 72 hours. Sepsis Labs: Recent Labs  Lab 12/24/20 1013 12/24/20 1143  LATICACIDVEN 1.5 1.3     Recent Results (from the past 240 hour(s))  Blood Culture (routine x 2)     Status: None (Preliminary result)   Collection Time: 12/24/20 10:13 AM   Specimen: BLOOD RIGHT WRIST  Result Value Ref Range Status   Specimen Description BLOOD RIGHT WRIST  Final   Special Requests   Final    BOTTLES DRAWN AEROBIC AND ANAEROBIC Blood Culture adequate volume   Culture   Final    NO GROWTH 2 DAYS Performed at [facility], [address] [place], [place] [address]    Report Status PENDING  Incomplete  Blood Culture (routine x 2)     Status: None (Preliminary result)   Collection Time: 12/24/20 10:14 AM   Specimen: Right Antecubital; Blood  Result Value Ref Range Status   Specimen Description RIGHT ANTECUBITAL  Final   Special Requests   Final    BOTTLES DRAWN AEROBIC AND ANAEROBIC Blood Culture adequate volume   Culture   Final    NO GROWTH 2 DAYS Performed at [facility], [address] [place], [place] [address]    Report Status PENDING  Incomplete  Resp Panel by RT-PCR (Flu A&B, Covid) Nasopharyngeal Swab     Status: None   Collection Time: 12/24/20 11:08 AM   Specimen: Nasopharyngeal Swab; Nasopharyngeal(NP) swabs in vial transport medium  Result Value Ref Range Status   SARS Coronavirus 2 by RT PCR NEGATIVE NEGATIVE Final    Comment: (NOTE) SARS-CoV-2 target nucleic acids are NOT DETECTED.  The SARS-CoV-2 RNA is generally detectable in upper respiratory specimens during the acute phase of infection. The lowest concentration of SARS-CoV-2 viral copies this assay can detect is 138 copies/mL. A negative result does not preclude SARS-Cov-2 infection and should not be used as the sole basis for treatment or other patient management  decisions. A negative result may occur with  improper specimen  collection/handling, submission of specimen other than nasopharyngeal swab, presence of viral mutation(s) within the areas targeted by this assay, and inadequate number of viral copies(<138 copies/mL). A negative result must be combined with clinical observations, patient history, and epidemiological information. The expected result is Negative.  Fact Sheet for Patients:  EntrepreneurPulse.com.au  Fact Sheet for Healthcare Providers:  IncredibleEmployment.be  This test is no t yet approved or cleared by the [place] FDA and  has been authorized for detection and/or diagnosis of SARS-CoV-2 by FDA under an Emergency Use Authorization (EUA). This EUA will remain  in effect (meaning this test can be used) for the duration of the COVID-19 declaration under Section 564(b)(1) of the Act, 21 U.S.C.section 360bbb-3(b)(1), unless the authorization is terminated  or revoked sooner.       Influenza A by PCR NEGATIVE NEGATIVE Final   Influenza B by PCR NEGATIVE NEGATIVE Final    Comment: (NOTE) The Xpert Xpress SARS-CoV-2/FLU/RSV plus assay is intended as an aid in the diagnosis of influenza from Nasopharyngeal swab specimens and should not be used as a sole basis for treatment. Nasal washings and aspirates are unacceptable for Xpert Xpress SARS-CoV-2/FLU/RSV testing.  Fact Sheet for Patients: EntrepreneurPulse.com.au  Fact Sheet for Healthcare Providers: IncredibleEmployment.be  This test is not yet approved or cleared by the [place] FDA and has been authorized for detection and/or diagnosis of SARS-CoV-2 by FDA under an Emergency Use Authorization (EUA). This EUA will remain in effect (meaning this test can be used) for the duration of the COVID-19 declaration under Section 564(b)(1) of the Act, 21 U.S.C. section 360bbb-3(b)(1), unless the authorization is terminated or revoked.  Performed at [facility], [address] [place], [place] [address]        Radiology Studies: PERIPHERAL VASCULAR CATHETERIZATION  Result Date: 12/25/2020 Table formatting from the original result was not included. PATIENT: Rajinder Dobberstein      MRN: [id number] DOB: [birth year]    DATE OF PROCEDURE: 12/25/2020 INDICATIONS:  Maurisio Vorhies is a 50 y.o. male who had a left brachiocephalic fistula placed earlier this year.  He had significant steal symptoms and was seen in consultation.  He is being considered for a DRIL procedure and was set up for an upper extremity arteriogram PROCEDURE:  Conscious sedation Ultrasound-guided access to the right common femoral artery Arch aortogram Selective catheterization of the left brachial artery with left upper extremity runoff Mynx closure right common femoral artery SURGEON: Judeth Cornfield. Scot Dock, MD, FACS ANESTHESIA: Local with sedation EBL: Minimal TECHNIQUE: The patient was brought to the peripheral vascular lab and was sedated. The period of conscious sedation was 47 minutes.  During that time period, I was present face-to-face 100% of the time.  The patient was administered 1 mg of Versed and 50 mcg of fentanyl. The patient's heart rate, blood pressure, and oxygen saturation were monitored by the nurse continuously during the procedure. Both groins were prepped and draped in the usual sterile fashion.  Under ultrasound guidance, after the skin was anesthetized, I cannulated the right common femoral artery with a micropuncture needle and a micropuncture sheath was introduced over a wire.  This was exchanged for a 5 [place] sheath over a Bentson wire.  By ultrasound the femoral artery was patent. A real-time image was obtained and sent to the server. A pigtail catheter was positioned in the ascending aorta and an arch aortogram was obtained in a 40 degree LAO projection.  I then selected the left subclavian artery and advanced the wire down past the vertebral into the brachial artery.  Selective  left upper extremity arteriogram was then obtained.  We also obtain films with compression of the fistula.  At the completion the catheter was removed.  The Mynx device was passed through the sheath and then deployed in the standard fashion.  There was good hemostasis. FINDINGS: No significant disease of the aortic arch, innominate, left proximal common carotid artery, and left proximal subclavian artery. The left subclavian, axillary, and brachial arteries are widely patent. The fistula is patent with several large competing branches.  There is minimal narrowing at the proximal end of the fistula. There is an early takeoff of the radial artery just beyond the anastomosis.  The radial artery is somewhat small with some moderate diffuse disease.  It fills the palmar arch in the hand. There is an interosseous branch which occludes above the wrist.  The ulnar artery is patent to the hand.   VAS [place] LOWER EXTREMITY SAPHENOUS VEIN MAPPING  Result Date: 12/25/2020 LOWER EXTREMITY VEIN MAPPING Patient Name:  JORDEN KNOPE  Date of Exam:   12/25/2020 Medical Rec #: [id number]    Accession #:    [id number] Date of Birth: 12/20/1970     Patient Gender: M Patient Age:   58 years Exam Location:  [facility] Procedure:      VAS [place] LOWER EXTREMITY [place] Referring Phys: Jamelle Haring --------------------------------------------------------------------------------  Indications:  Ulceration LT hand, pre-op evaluation for DRIL procedure Risk Factors: Hypertension, hyperlipidemia, Diabetes.  Comparison Study: No prior studies. Performing Technologist: Darlin Coco RDMS, RVT  Examination Guidelines: A complete evaluation includes B-mode imaging, spectral Doppler, color Doppler, and power Doppler as needed of all accessible portions of each vessel. Bilateral testing is considered an integral part of a complete examination. Limited examinations for reoccurring indications may be performed as noted.  +---------------+-----------+----------------------+---------------+-----------+   RT Diameter  RT Findings         GSV            LT Diameter  LT Findings      (cm)                                            (cm)                  +---------------+-----------+----------------------+---------------+-----------+      0.60                     Saphenofemoral         0.53                                                   Junction                                  +---------------+-----------+----------------------+---------------+-----------+      0.43                     Proximal thigh         0.32                  +---------------+-----------+----------------------+---------------+-----------+  0.26       branching       Mid thigh            0.26                  +---------------+-----------+----------------------+---------------+-----------+      0.28                      Distal thigh          0.26                  +---------------+-----------+----------------------+---------------+-----------+      0.29                          Knee              0.29                  +---------------+-----------+----------------------+---------------+-----------+      0.25                       Prox calf            0.23       branching  +---------------+-----------+----------------------+---------------+-----------+      0.26       branching        Mid calf            0.24                  +---------------+-----------+----------------------+---------------+-----------+      0.26                      Distal calf           0.27                  +---------------+-----------+----------------------+---------------+-----------+      0.24                         Ankle              0.23                  +---------------+-----------+----------------------+---------------+-----------+ Diagnosing physician: Deitra Mayo MD Electronically signed by Deitra Mayo MD on 12/25/2020 at 6:32:49 PM.    Final     Scheduled Meds:  allopurinol  100 mg Oral Daily   aspirin EC  81 mg Oral Daily   atorvastatin  40 mg Oral QHS   cephALEXin  500 mg Oral Q12H   Chlorhexidine Gluconate Cloth  6 each Topical Q0600   cholecalciferol  2,000 Units Oral Daily   feeding supplement (NEPRO CARB STEADY)  237 mL Oral BID BM   folic acid  1 mg Oral Daily   heparin  5,000 Units Subcutaneous Q8H   insulin aspart  0-6 Units Subcutaneous Q4H   multivitamin  1 tablet Oral QHS   sodium chloride flush  3 mL Intravenous Q12H   sucroferric oxyhydroxide  1,000 mg Oral TID WC   cyanocobalamin  1,000 mcg Oral Daily   Continuous Infusions:  sodium chloride     sodium chloride     sodium chloride     vancomycin       LOS: 2 days   Time spent: 30 minutes   Darliss Cheney, MD [facility]  12/26/2020, 12:04 PM   How to contact the [facility]  Attending or Consulting provider [place] or covering provider during after hours [place], for this patient?  Check the care team in [facility] and look for a) attending/consulting TR[facility] provider listed and b) the [facility] team listed. Page or secure chat [phone]. Log into [email or web address] and use [place]'s universal password to access. If you do not have the password, please contact the hospital operator. Locate the [facility] provider you are looking for under [facility] and page to a number that you can be directly reached. If you still have difficulty reaching the provider, please page the [facility] (Director on Call) for the Hospitalists listed on amion for assistance.

## 2020-12-26 NOTE — Progress Notes (Signed)
Attending made aware.  Patient tachycardic, diaphoretic. C/o back pain and mild chest tightness. Blood cultures ordered by provider. EKG completed and read sinus tach.  Will continue to monitor.     12/26/20 1226  Assess: MEWS Score  Temp (!) 101.2 F (38.4 C)  ECG Heart Rate (!) 125  Assess: MEWS Score  MEWS Temp 1  MEWS Systolic 0  MEWS Pulse 2  MEWS RR 1  MEWS LOC 0  MEWS Score 4  MEWS Score Color Red  Assess: if the MEWS score is Yellow or Red  Were vital signs taken at a resting state? No  Focused Assessment Change from prior assessment (see assessment flowsheet)  Early Detection of Sepsis Score *See Row Information* High  MEWS guidelines implemented *See Row Information* Yes  Treat  Pain Scale 0-10  Pain Score 10  Pain Type Acute pain  Pain Location Back  Pain Orientation Mid;Lower  Pain Radiating Towards laterally  Pain Descriptors / Indicators Sharp  Pain Frequency Occasional  Pain Onset Sudden  Patients Stated Pain Goal 2  Pain Intervention(s) Medication (See eMAR)  Take Vital Signs  Increase Vital Sign Frequency  Red: Q 1hr X 4 then Q 4hr X 4, if remains red, continue Q 4hrs  Escalate  MEWS: Escalate Red: discuss with charge nurse/RN and provider, consider discussing with RRT  Notify: Provider  Provider Name/Title Darliss Cheney  Date Provider Notified 12/26/20  Time Provider Notified [id number]  Notification Type Page  Provider response See new orders  Date of Provider Response 12/26/20  Time of Provider Response 0945

## 2020-12-27 DIAGNOSIS — L089 Local infection of the skin and subcutaneous tissue, unspecified: Secondary | ICD-10-CM | POA: Diagnosis not present

## 2020-12-27 DIAGNOSIS — E118 Type 2 diabetes mellitus with unspecified complications: Secondary | ICD-10-CM | POA: Diagnosis not present

## 2020-12-27 LAB — CBC WITH DIFFERENTIAL/PLATELET
Abs Immature Granulocytes: 0.06 10*3/uL (ref 0.00–0.07)
Basophils Absolute: 0 10*3/uL (ref 0.0–0.1)
Basophils Relative: 0 %
Eosinophils Absolute: 0.1 10*3/uL (ref 0.0–0.5)
Eosinophils Relative: 1 %
HCT: 32.5 % — ABNORMAL LOW (ref 39.0–52.0)
Hemoglobin: 10.7 g/dL — ABNORMAL LOW (ref 13.0–17.0)
Immature Granulocytes: 1 %
Lymphocytes Relative: 18 %
Lymphs Abs: 2.1 10*3/uL (ref 0.7–4.0)
MCH: 30.1 pg (ref 26.0–34.0)
MCHC: 32.9 g/dL (ref 30.0–36.0)
MCV: 91.5 fL (ref 80.0–100.0)
Monocytes Absolute: 1 10*3/uL (ref 0.1–1.0)
Monocytes Relative: 9 %
Neutro Abs: 8.2 10*3/uL — ABNORMAL HIGH (ref 1.7–7.7)
Neutrophils Relative %: 71 %
Platelets: 133 10*3/uL — ABNORMAL LOW (ref 150–400)
RBC: 3.55 MIL/uL — ABNORMAL LOW (ref 4.22–5.81)
RDW: 15.7 % — ABNORMAL HIGH (ref 11.5–15.5)
WBC: 11.5 10*3/uL — ABNORMAL HIGH (ref 4.0–10.5)
nRBC: 0 % (ref 0.0–0.2)

## 2020-12-27 LAB — COMPREHENSIVE METABOLIC PANEL
ALT: 77 U/L — ABNORMAL HIGH (ref 0–44)
AST: 84 U/L — ABNORMAL HIGH (ref 15–41)
Albumin: 3.1 g/dL — ABNORMAL LOW (ref 3.5–5.0)
Alkaline Phosphatase: 63 U/L (ref 38–126)
Anion gap: 12 (ref 5–15)
BUN: 39 mg/dL — ABNORMAL HIGH (ref 6–20)
CO2: 27 mmol/L (ref 22–32)
Calcium: 8.6 mg/dL — ABNORMAL LOW (ref 8.9–10.3)
Chloride: 93 mmol/L — ABNORMAL LOW (ref 98–111)
Creatinine, Ser: 6.13 mg/dL — ABNORMAL HIGH (ref 0.61–1.24)
GFR, Estimated: 10 mL/min — ABNORMAL LOW (ref >60)
Glucose, Bld: 156 mg/dL — ABNORMAL HIGH (ref 70–99)
Potassium: 3.7 mmol/L (ref 3.5–5.1)
Sodium: 132 mmol/L — ABNORMAL LOW (ref 135–145)
Total Bilirubin: 0.6 mg/dL (ref 0.3–1.2)
Total Protein: 6.5 g/dL (ref 6.5–8.1)

## 2020-12-27 LAB — GLUCOSE, CAPILLARY
Glucose-Capillary: 110 mg/dL — ABNORMAL HIGH (ref 70–99)
Glucose-Capillary: 114 mg/dL — ABNORMAL HIGH (ref 70–99)
Glucose-Capillary: 169 mg/dL — ABNORMAL HIGH (ref 70–99)
Glucose-Capillary: 170 mg/dL — ABNORMAL HIGH (ref 70–99)
Glucose-Capillary: 232 mg/dL — ABNORMAL HIGH (ref 70–99)
Glucose-Capillary: 276 mg/dL — ABNORMAL HIGH (ref 70–99)

## 2020-12-27 LAB — MAGNESIUM: Magnesium: 2.1 mg/dL (ref 1.7–2.4)

## 2020-12-27 LAB — PHOSPHORUS: Phosphorus: 4.1 mg/dL (ref 2.5–4.6)

## 2020-12-27 MED ORDER — MIDODRINE HCL 5 MG PO TABS
5.0000 mg | ORAL_TABLET | ORAL | Status: DC
  Administered 2020-12-28 – 2021-01-04 (×4): 5 mg via ORAL
  Filled 2020-12-27 (×4): qty 1

## 2020-12-27 NOTE — Progress Notes (Signed)
[facility] Progress Note   Subjective: Significant vomiting at end of HD yesterday as well as low grade fever/chills. Blood Cx redrawn - NGTD. Blood Cx 9/1 negative as well. He reports it was the exact same as the episodes at his center which prompted his admission. He said that symptoms resolved over 1 hr after HD, as at home and that he has felt fine since that time. I am not sure what to make of this -- doesn't seem to be a systemic infection. Has been on HD > 42moand just started happening, doesn't seem to be medication induced or dialysis reaction. BP chronically low but does not drop further, no cramping that would make suspicious for pulling too much fluid. No chest pain or dyspnea, no known CAD.  Objective Vitals:   12/26/20 2046 12/27/20 0016 12/27/20 0413 12/27/20 0830  BP: (!) 84/50 90/71 (!) 93/59 (!) 105/59  Pulse: 84 81 78 92  Resp: '16 17 18 16  '$ Temp: 99.1 F (37.3 C) 99.1 F (37.3 C) 98.6 F (37 C) 97.9 F (36.6 C)  TempSrc: Oral Oral Oral Oral  SpO2: 98% 98% 98% 93%  Weight:      Height:       Physical Exam General: Well appearing man, NAD Heart: RRR; no murmur Lungs: CTA, no rales Abdomen: soft Extremities: Trace BLE edema Dialysis Access: TDC in R chest, LUE AVF + bruit, ischemic ulcers to L 2nd/5th fingers   Additional Objective Labs: Basic Metabolic Panel: Recent Labs  Lab 12/25/20 1544 12/26/20 0717 12/27/20 0149  NA 134* 135 132*  K 3.8 3.4* 3.7  CL 95* 97* 93*  CO2 20* 23 27  GLUCOSE 182* 87 156*  BUN 65* 69* 39*  CREATININE 8.16*  7.95* 8.92* 6.13*  CALCIUM 8.8* 8.7* 8.6*  PHOS 6.4* 6.3* 4.1   Liver Function Tests: Recent Labs  Lab 12/25/20 0214 12/25/20 1544 12/26/20 0717 12/27/20 0149  AST 46*  --  33 84*  ALT 40  --  33 77*  ALKPHOS 46  --  49 63  BILITOT 0.7  --  0.7 0.6  PROT 6.2*  --  6.3* 6.5  ALBUMIN 3.1* 3.4* 3.1* 3.1*   CBC: Recent Labs  Lab 12/24/20 1013 12/25/20 0214 12/25/20 1544 12/26/20 0717  12/27/20 0149  WBC 5.5 5.3 5.8 5.3 11.5*  NEUTROABS 3.5 2.8  --  3.1 8.2*  HGB 11.3* 10.3* 11.8* 10.2* 10.7*  HCT 35.0* 32.0* 35.9* 31.8* 32.5*  MCV 93.3 92.2 91.1 91.9 91.5  PLT 126* PLATELET CLUMPS NOTED ON SMEAR, UNABLE TO ESTIMATE 124* 132* 133*   Blood Culture    Component Value Date/Time   SDES BLOOD RIGHT ANTECUBITAL 12/26/2020 1526   SPECREQUEST  12/26/2020 1526    BOTTLES DRAWN AEROBIC ONLY Blood Culture adequate volume   CULT  12/26/2020 1526    NO GROWTH < 24 HOURS [facility]formed at [id number] [place] [id number]   REPTSTATUS PENDING 12/26/2020 1526   Studies/Results: VAS [place]OWER EXTREMITY SAPHENOUS VEIN MAPPING  Result Date: 12/25/2020 LOWER EXTREMITY VEIN MAPPING Patient Name:  LPINKNEY LISSNER Date of Exam:   12/25/2020 Medical Rec #: [id number]   Accession #:    [id number] of Birth: 703/03/1971    Patient Gender: M Patient Age:   89years [facility]      VAS [place] EXTREMITY SAPHENOUS VEIN MAPPING Referring Phys: TJamelle Haring--------------------------------------------------------------------------------  Indications:  Ulceration LT hand, pre-op evaluation for DRIL procedure Risk Factors: Hypertension, hyperlipidemia, Diabetes.  Comparison Study: No prior studies. Performing Technologist: Darlin Coco RDMS, RVT  Examination Guidelines: A complete evaluation includes B-mode imaging, spectral Doppler, color Doppler, and power Doppler as needed of all accessible portions of each vessel. Bilateral testing is considered an integral part of a complete examination. Limited examinations for reoccurring indications may be performed as noted. +---------------+-----------+----------------------+---------------+-----------+   RT Diameter  RT Findings         GSV            LT Diameter  LT Findings      (cm)                                            (cm)                   +---------------+-----------+----------------------+---------------+-----------+      0.60                     Saphenofemoral         0.53                                                   Junction                                  +---------------+-----------+----------------------+---------------+-----------+      0.43                     Proximal thigh         0.32                  +---------------+-----------+----------------------+---------------+-----------+      0.26       branching       Mid thigh            0.26                  +---------------+-----------+----------------------+---------------+-----------+      0.28                      Distal thigh          0.26                  +---------------+-----------+----------------------+---------------+-----------+      0.29                          Knee              0.29                  +---------------+-----------+----------------------+---------------+-----------+      0.25                       Prox calf            0.23       branching  +---------------+-----------+----------------------+---------------+-----------+      0.26       branching        Mid calf  0.24                  +---------------+-----------+----------------------+---------------+-----------+      0.26                      Distal calf           0.27                  +---------------+-----------+----------------------+---------------+-----------+      0.24                         Ankle              0.23                  +---------------+-----------+----------------------+---------------+-----------+ Diagnosing physician: Deitra Mayo MD Electronically signed by Deitra Mayo MD on 12/25/2020 at 6:32:49 PM.    Final     Medications:  sodium chloride     vancomycin      allopurinol  100 mg Oral Daily   aspirin EC  81 mg Oral Daily   atorvastatin  40 mg Oral QHS   cephALEXin  500 mg Oral Q12H    Chlorhexidine Gluconate Cloth  6 each Topical Q0600   cholecalciferol  2,000 Units Oral Daily   feeding supplement (NEPRO CARB STEADY)  237 mL Oral BID BM   folic acid  1 mg Oral Daily   heparin  5,000 Units Subcutaneous Q8H   insulin aspart  0-6 Units Subcutaneous Q4H   multivitamin  1 tablet Oral QHS   sodium chloride flush  3 mL Intravenous Q12H   sucroferric oxyhydroxide  1,000 mg Oral TID WC   cyanocobalamin  1,000 mcg Oral Daily    Dialysis Orders: MWF at [facility], 400/600, EDW 99.5kg, 2K/2.5Ca, AVF + TDC, heparin 5000 - Calcitriol 0.43mg PO q HD - No ESA   Assessment/Plan: 1. Chills/rigors/vomiting: Blood Cx 9/1, 9/4 negative. CXR clear. Symptoms resolved initially, recurred again at tail end of HD on 9/3, now completely resolved again. Unclear etiology. Plan for HD: 1) double rinse HD lines pre-HD, 2) change to UFP #2 and lower UFG, 3) pre-medicate with zofran, 4) give midodrine pre - HD. Also, consider abdominal imaging and cardiac testing (stress test, ?could this be angina equiv) 2. 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 On IV Vanc + PO Keflex. 3. ESRD: Back to usual MWF schedule - HD tomorrow, 3K bath. 4. HTN/volume: BP low/stable, follow. 5. Anemia: Hgb 10.2, no ESA for now. 6. Secondary hyperparathyroidism: Ca ok, Phos high - continue Velphoro, will ^ dose. 7. Nutrition: Alb low, continue protein supplements. 8. T2DM  KVeneta Penton PA-C 12/27/2020, 12:05 PM  [facility]

## 2020-12-27 NOTE — Progress Notes (Signed)
PROGRESS NOTE    Kevin Meyer  DGL:[id number] DOB: Jun 09, 1970 DOA: 12/24/2020 PCP: Raiford Simmonds., PA-C   Brief Narrative:  Plan Kevin Meyer is a 50 year old gentleman with past history of essential hypertension, hyperlipidemia, OA, gout, anemia, type 2 diabetes mellitus, diabetic nephropathy, ESRD on HD MWF s/p right-sided tunneled catheter and left AV fistula, obesity, chronic systolic CHF [facility] with complaint of fever and left hand pain.  Was diagnosed with ulcers and vascular insufficiency as well as infection of the left fingers.  Transfer to Kevin Meyer for vascular consult.  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  Patient underwent angiogram/aortogram and selective catheterization of the left brachial artery with left upper extremity runoff.  Vein mapping is done.  Plan for DRIL procedure on Monday next week.  Patient did develop fever with a T-max of 101.2 yesterday with tachycardia and met criteria for sepsis.  Continue vancomycin and Keflex.  Blood cultures were drawn and pending.  Lactic acid normal.  Has remained afebrile for about 24 hours now.  ESRD on HD: Nephrology consulted.  Hypokalemia: Resolved.  Essential hypertension: Controlled.  Continue current medications.  Type 2 diabetes mellitus with nephropathy: Hemoglobin A1c 7.2.  Continue SSI.  Hyperlipidemia: Continue statin.  Class II obesity: Counseling for weight loss provided.  DVT prophylaxis: heparin injection 5,000 Units  Start: 12/25/20 2200 SCDs Start: 12/24/20 1426   Code Status: Full Code  Family Communication:  None present at bedside.  Plan of care discussed with patient in length and he verbalized understanding and agreed with it.  Status is: Inpatient  Remains inpatient appropriate because:Ongoing diagnostic testing needed not appropriate for outpatient work up  Dispo: The patient is from: Home              Anticipated d/c is to: Home              Patient currently is not medically stable to d/c.   Difficult to place patient No        Estimated body mass index is 34.69 kg/m as calculated from the following:   Height as of this encounter: _0  (1.676 m).   Weight as of this encounter: 97.5 kg.     Nutritional Assessment: Body mass index is 34.69 kg/m.Marland Kitchen Seen by dietician.  I agree with the assessment and plan as outlined below: Nutrition Status: Nutrition Problem: Increased nutrient needs Etiology: wound healing Signs/Symptoms: estimated needs Interventions: Nepro shake, MVI  .  Skin Assessment: I have examined the patient's skin and I agree with the wound assessment as performed by the wound care RN as outlined below:    Consultants:  Vascular surgery  Procedures:  As above  Antimicrobials:  Anti-infectives (From admission, onward)    Start     Dose/Rate Route Frequency Ordered Stop   12/26/20 0745  vancomycin (VANCOCIN) IVPB 1000 mg/200 mL premix        1,000 mg 200 mL/hr over 60 Minutes Intravenous  Once 12/26/20 0736 12/26/20 1030   12/25/20  2200  cephALEXin (KEFLEX) capsule 500 mg        500 mg Oral Every 12 hours 12/25/20 1056     12/25/20 1600  ceFEPIme (MAXIPIME) 2 g in sodium chloride 0.9 % 100 mL IVPB  Status:  Discontinued        2 g 200 mL/hr over 30 Minutes Intravenous Every M-W-F (Hemodialysis) 12/24/20 1233 12/25/20 1056   12/25/20 1600  vancomycin (VANCOCIN) IVPB 1000 mg/200 mL premix        1,000 mg 200 mL/hr over 60 Minutes Intravenous Every M-W-F  (Hemodialysis) 12/24/20 1233     12/24/20 1000  vancomycin (VANCOREADY) IVPB 2000 mg/400 mL        2,000 mg 200 mL/hr over 120 Minutes Intravenous  Once 12/24/20 0946 12/24/20 1455   12/24/20 0945  ceFEPIme (MAXIPIME) 2 g in sodium chloride 0.9 % 100 mL IVPB        2 g 200 mL/hr over 30 Minutes Intravenous  Once 12/24/20 0941 12/24/20 1133   12/24/20 0945  vancomycin (VANCOCIN) IVPB 1000 mg/200 mL premix  Status:  Discontinued        1,000 mg 200 mL/hr over 60 Minutes Intravenous  Once 12/24/20 0941 12/24/20 0946   12/24/20 0945  metroNIDAZOLE (FLAGYL) IVPB 500 mg  Status:  Discontinued        500 mg 100 mL/hr over 60 Minutes Intravenous Every 12 hours 12/24/20 0941 12/25/20 1056          Subjective: Patient seen and examined.  He has no new complaint.  Objective: Vitals:   12/26/20 2046 12/27/20 0016 12/27/20 0413 12/27/20 0830  BP: (!) 84/50 90/71 (!) 93/59 (!) 105/59  Pulse: 84 81 78 92  Resp: _0 Temp: 99.1 F (37.3 C) 99.1 F (37.3 C) 98.6 F (37 C) 97.9 F (36.6 C)  TempSrc: Oral Oral Oral Oral  SpO2: 98% 98% 98% 93%  Weight:      Height:        Intake/Output Summary (Last 24 hours) at 12/27/2020 1140 Last data filed at 12/27/2020 0829 Gross per 24 hour  Intake 606 ml  Output 200 ml  Net 406 ml    Filed Weights   12/25/20 1529 12/26/20 0705 12/26/20 1123  Weight: 99.6 kg 101.1 kg 97.5 kg    Examination:  General exam: Appears calm and comfortable  Respiratory system: Clear to auscultation. Respiratory effort normal. Cardiovascular system: S1 & S2 heard, RRR. No JVD, murmurs, rubs, gallops or clicks. No pedal edema. Gastrointestinal system: Abdomen is nondistended, soft and nontender. No organomegaly or masses felt. Normal bowel sounds heard. Central nervous system: Alert and oriented. No focal neurological deficits. Extremities: Symmetric 5 x 5 power. Skin: Ulceration and mild erythema of the left fifth finger, ulceration and superficial necrosis  of the left index finger and ulceration of the left thumb. Psychiatry: Judgement and insight appear normal. Mood & affect appropriate.   Data Reviewed: I have personally reviewed following labs and imaging studies  CBC: Recent Labs  Lab 12/24/20 1013 12/25/20 0214 12/25/20 1544 12/26/20 0717 12/27/20 0149  WBC 5.5 5.3 5.8 5.3 11.5*  NEUTROABS 3.5 2.8  --  3.1 8.2*  HGB 11.3* 10.3* 11.8* 10.2* 10.7*  HCT 35.0* 32.0* 35.9* 31.8* 32.5*  MCV 93.3 92.2 91.1 91.9 91.5  PLT 126* PLATELET CLUMPS NOTED ON SMEAR, UNABLE TO ESTIMATE 124* 132* 133*    Basic Metabolic Panel: Recent Labs  Lab 12/24/20 1013 12/25/20 0214 12/25/20 1544 12/26/20 0717 12/27/20  0149  NA 131* 133* 134* 135 132*  K 3.5 3.2* 3.8 3.4* 3.7  CL 92* 94* 95* 97* 93*  CO2 27 25 20* 23 27  GLUCOSE 167* 151* 182* 87 156*  BUN 46* 57* 65* 69* 39*  CREATININE 6.55* 7.69* 8.16*  7.95* 8.92* 6.13*  CALCIUM 8.6* 8.5* 8.8* 8.7* 8.6*  MG  --  2.1  --  2.2 2.1  PHOS  --  6.1* 6.4* 6.3* 4.1    GFR: Estimated Creatinine Clearance: 15.8 mL/min (A) (by C-G formula based on SCr of 6.13 mg/dL (H)). Liver Function Tests: Recent Labs  Lab 12/24/20 1013 12/25/20 0214 12/25/20 1544 12/26/20 0717 12/27/20 0149  AST 54* 46*  --  33 84*  ALT 45* 40  --  33 77*  ALKPHOS 58 46  --  49 63  BILITOT 0.5 0.7  --  0.7 0.6  PROT 7.2 6.2*  --  6.3* 6.5  ALBUMIN 3.7 3.1* 3.4* 3.1* 3.1*    No results for input(s): LIPASE, AMYLASE in the last 168 hours. No results for input(s): AMMONIA in the last 168 hours. Coagulation Profile: Recent Labs  Lab 12/24/20 1013  INR 1.0    Cardiac Enzymes: No results for input(s): CKTOTAL, CKMB, CKMBINDEX, TROPONINI in the last 168 hours. BNP (last 3 results) No results for input(s): PROBNP in the last 8760 hours. HbA1C: Recent Labs    12/24/20 1716  HGBA1C 7.2*    CBG: Recent Labs  Lab 12/26/20 1608 12/26/20 2045 12/27/20 0015 12/27/20 0411 12/27/20 0801  GLUCAP 316* 187*  169* 114* 110*    Lipid Profile: No results for input(s): CHOL, HDL, LDLCALC, TRIG, CHOLHDL, LDLDIRECT in the last 72 hours. Thyroid Function Tests: No results for input(s): TSH, T4TOTAL, FREET4, T3FREE, THYROIDAB in the last 72 hours. Anemia Panel: No results for input(s): VITAMINB12, FOLATE, FERRITIN, TIBC, IRON, RETICCTPCT in the last 72 hours. Sepsis Labs: Recent Labs  Lab 12/24/20 1013 12/24/20 1143 12/26/20 1337  LATICACIDVEN 1.5 1.3 1.2     Recent Results (from the past 240 hour(s))  Blood Culture (routine x 2)     Status: None (Preliminary result)   Collection Time: 12/24/20 10:13 AM   Specimen: BLOOD RIGHT WRIST  Result Value Ref Range Status   Specimen Description BLOOD RIGHT WRIST  Final   Special Requests   Final    BOTTLES DRAWN AEROBIC AND ANAEROBIC Blood Culture adequate volume   Culture   Final    NO GROWTH 2 DAYS Performed at [facility], [address], [place], [place] [address]    Report Status PENDING  Incomplete  Blood Culture (routine x 2)     Status: None (Preliminary result)   Collection Time: 12/24/20 10:14 AM   Specimen: Right Antecubital; Blood  Result Value Ref Range Status   Specimen Description RIGHT ANTECUBITAL  Final   Special Requests   Final    BOTTLES DRAWN AEROBIC AND ANAEROBIC Blood Culture adequate volume   Culture   Final    NO GROWTH 2 DAYS Performed at [facility], [address], [place], [place] [address]    Report Status PENDING  Incomplete  Resp Panel by RT-PCR (Flu A&B, Covid) Nasopharyngeal Swab     Status: None   Collection Time: 12/24/20 11:08 AM   Specimen: Nasopharyngeal Swab; Nasopharyngeal(NP) swabs in vial transport medium  Result Value Ref Range Status   SARS Coronavirus 2 by RT PCR NEGATIVE NEGATIVE Final    Comment: (NOTE) SARS-CoV-2 target nucleic acids are  NOT DETECTED.  The SARS-CoV-2 RNA is generally detectable in upper respiratory specimens during the acute phase of infection. The lowest concentration  of SARS-CoV-2 viral copies this assay can detect is 138 copies/mL. A negative result does not preclude SARS-Cov-2 infection and should not be used as the sole basis for treatment or other patient management decisions. A negative result may occur with  improper specimen collection/handling, submission of specimen other than nasopharyngeal swab, presence of viral mutation(s) within the areas targeted by this assay, and inadequate number of viral copies(<138 copies/mL). A negative result must be combined with clinical observations, patient history, and epidemiological information. The expected result is Negative.  Fact Sheet for Patients:  EntrepreneurPulse.com.au  Fact Sheet for Healthcare Providers:  IncredibleEmployment.be  This test is no t yet approved or cleared by the [place] FDA and  has been authorized for detection and/or diagnosis of SARS-CoV-2 by FDA under an Emergency Use Authorization (EUA). This EUA will remain  in effect (meaning this test can be used) for the duration of the COVID-19 declaration under Section 564(b)(1) of the Act, 21 U.S.C.section 360bbb-3(b)(1), unless the authorization is terminated  or revoked sooner.       Influenza A by PCR NEGATIVE NEGATIVE Final   Influenza B by PCR NEGATIVE NEGATIVE Final    Comment: (NOTE) The Xpert Xpress SARS-CoV-2/FLU/RSV plus assay is intended as an aid in the diagnosis of influenza from Nasopharyngeal swab specimens and should not be used as a sole basis for treatment. Nasal washings and aspirates are unacceptable for Xpert Xpress SARS-CoV-2/FLU/RSV testing.  Fact Sheet for Patients: EntrepreneurPulse.com.au  Fact Sheet for Healthcare Providers: IncredibleEmployment.be  This test is not yet approved or cleared by the [place] FDA and has been authorized for detection and/or diagnosis of SARS-CoV-2 by FDA under an Emergency Use  Authorization (EUA). This EUA will remain in effect (meaning this test can be used) for the duration of the COVID-19 declaration under Section 564(b)(1) of the Act, 21 U.S.C. section 360bbb-3(b)(1), unless the authorization is terminated or revoked.  Performed at [facility], [address] [place], [place] [address]   Culture, blood (routine x 2)     Status: None (Preliminary result)   Collection Time: 12/26/20  1:52 PM   Specimen: BLOOD  Result Value Ref Range Status   Specimen Description BLOOD BLOOD LEFT FOREARM  Final   Special Requests   Final    BOTTLES DRAWN AEROBIC AND ANAEROBIC Blood Culture adequate volume   Culture   Final    NO GROWTH < 24 HOURS Performed at [facility], [place] [address] [place], [place] [address]    Report Status PENDING  Incomplete  Culture, blood (routine x 2)     Status: None (Preliminary result)   Collection Time: 12/26/20  3:26 PM   Specimen: BLOOD  Result Value Ref Range Status   Specimen Description BLOOD RIGHT ANTECUBITAL  Final   Special Requests   Final    BOTTLES DRAWN AEROBIC ONLY Blood Culture adequate volume   Culture   Final    NO GROWTH < 24 HOURS Performed at [facility], [place] [address] [place], [place] [address]    Report Status PENDING  Incomplete       Radiology Studies: VAS [place] LOWER EXTREMITY SAPHENOUS VEIN MAPPING  Result Date: 12/25/2020 LOWER EXTREMITY VEIN MAPPING Patient Name:  Kevin Meyer  Date of Exam:   12/25/2020 Medical Rec #: [id number]    Accession #:  [id number] Date of Birth: [birth year]     Patient Gender: M Patient Age:   64 years Exam Location:  [facility] Procedure:      VAS [place] LOWER EXTREMITY SAPHENOUS VEIN MAPPING Referring Phys: Jamelle Haring --------------------------------------------------------------------------------  Indications:  Ulceration LT hand, pre-op evaluation for DRIL procedure Risk Factors: Hypertension, hyperlipidemia, Diabetes.  Comparison Study: No prior studies.  Performing Technologist: Darlin Coco RDMS, RVT  Examination Guidelines: A complete evaluation includes B-mode imaging, spectral Doppler, color Doppler, and power Doppler as needed of all accessible portions of each vessel. Bilateral testing is considered an integral part of a complete examination. Limited examinations for reoccurring indications may be performed as noted. +---------------+-----------+----------------------+---------------+-----------+   RT Diameter  RT Findings         GSV            LT Diameter  LT Findings      (cm)                                            (cm)                  +---------------+-----------+----------------------+---------------+-----------+      0.60                     Saphenofemoral         0.53                                                   Junction                                  +---------------+-----------+----------------------+---------------+-----------+      0.43                     Proximal thigh         0.32                  +---------------+-----------+----------------------+---------------+-----------+      0.26       branching       Mid thigh            0.26                  +---------------+-----------+----------------------+---------------+-----------+      0.28                      Distal thigh          0.26                  +---------------+-----------+----------------------+---------------+-----------+      0.29                          Knee              0.29                  +---------------+-----------+----------------------+---------------+-----------+      0.25                       Prox calf  0.23       branching  +---------------+-----------+----------------------+---------------+-----------+      0.26       branching        Mid calf            0.24                  +---------------+-----------+----------------------+---------------+-----------+      0.26                       Distal calf           0.27                  +---------------+-----------+----------------------+---------------+-----------+      0.24                         Ankle              0.23                  +---------------+-----------+----------------------+---------------+-----------+ Diagnosing physician: Deitra Mayo MD Electronically signed by Deitra Mayo MD on 12/25/2020 at 6:32:49 PM.    Final     Scheduled Meds:  allopurinol  100 mg Oral Daily   aspirin EC  81 mg Oral Daily   atorvastatin  40 mg Oral QHS   cephALEXin  500 mg Oral Q12H   Chlorhexidine Gluconate Cloth  6 each Topical Q0600   cholecalciferol  2,000 Units Oral Daily   feeding supplement (NEPRO CARB STEADY)  237 mL Oral BID BM   folic acid  1 mg Oral Daily   heparin  5,000 Units Subcutaneous Q8H   insulin aspart  0-6 Units Subcutaneous Q4H   multivitamin  1 tablet Oral QHS   sodium chloride flush  3 mL Intravenous Q12H   sucroferric oxyhydroxide  1,000 mg Oral TID WC   cyanocobalamin  1,000 mcg Oral Daily   Continuous Infusions:  sodium chloride     vancomycin       LOS: 3 days   Time spent: 27 minutes   Darliss Cheney, MD [facility]  12/27/2020, 11:40 AM   How to contact the [facility] Attending or Consulting provider [place] or covering provider during after hours [place], for this patient?  Check the care team in [facility] and look for a) attending/consulting [facility] provider listed and b) the [facility] team listed. Page or secure chat [phone]. Log into [email or web address] and use [place]'s universal password to access. If you do not have the password, please contact the hospital operator. Locate the [facility] provider you are looking for under [facility] and page to a number that you can be directly reached. If you still have difficulty reaching the provider, please page the [facility] (Director on Call) for the Hospitalists listed on amion for assistance.

## 2020-12-27 NOTE — Progress Notes (Signed)
Pt had a 6 beat run of SVT. Pt does not have symptoms. Dr. Doristine Bosworth advised.

## 2020-12-28 DIAGNOSIS — L089 Local infection of the skin and subcutaneous tissue, unspecified: Secondary | ICD-10-CM | POA: Diagnosis not present

## 2020-12-28 LAB — COMPREHENSIVE METABOLIC PANEL
ALT: 94 U/L — ABNORMAL HIGH (ref 0–44)
AST: 90 U/L — ABNORMAL HIGH (ref 15–41)
Albumin: 2.9 g/dL — ABNORMAL LOW (ref 3.5–5.0)
Alkaline Phosphatase: 54 U/L (ref 38–126)
Anion gap: 11 (ref 5–15)
BUN: 66 mg/dL — ABNORMAL HIGH (ref 6–20)
CO2: 28 mmol/L (ref 22–32)
Calcium: 8.7 mg/dL — ABNORMAL LOW (ref 8.9–10.3)
Chloride: 94 mmol/L — ABNORMAL LOW (ref 98–111)
Creatinine, Ser: 7.63 mg/dL — ABNORMAL HIGH (ref 0.61–1.24)
GFR, Estimated: 8 mL/min — ABNORMAL LOW (ref >60)
Glucose, Bld: 149 mg/dL — ABNORMAL HIGH (ref 70–99)
Potassium: 3.7 mmol/L (ref 3.5–5.1)
Sodium: 133 mmol/L — ABNORMAL LOW (ref 135–145)
Total Bilirubin: 0.3 mg/dL (ref 0.3–1.2)
Total Protein: 6.2 g/dL — ABNORMAL LOW (ref 6.5–8.1)

## 2020-12-28 LAB — GLUCOSE, CAPILLARY
Glucose-Capillary: 148 mg/dL — ABNORMAL HIGH (ref 70–99)
Glucose-Capillary: 169 mg/dL — ABNORMAL HIGH (ref 70–99)
Glucose-Capillary: 169 mg/dL — ABNORMAL HIGH (ref 70–99)
Glucose-Capillary: 179 mg/dL — ABNORMAL HIGH (ref 70–99)
Glucose-Capillary: 190 mg/dL — ABNORMAL HIGH (ref 70–99)
Glucose-Capillary: 232 mg/dL — ABNORMAL HIGH (ref 70–99)

## 2020-12-28 LAB — CBC WITH DIFFERENTIAL/PLATELET
Abs Immature Granulocytes: 0 10*3/uL (ref 0.00–0.07)
Basophils Absolute: 0 10*3/uL (ref 0.0–0.1)
Basophils Relative: 0 %
Eosinophils Absolute: 0.3 10*3/uL (ref 0.0–0.5)
Eosinophils Relative: 3 %
HCT: 29.2 % — ABNORMAL LOW (ref 39.0–52.0)
Hemoglobin: 9.6 g/dL — ABNORMAL LOW (ref 13.0–17.0)
Lymphocytes Relative: 16 %
Lymphs Abs: 1.4 10*3/uL (ref 0.7–4.0)
MCH: 30.1 pg (ref 26.0–34.0)
MCHC: 32.9 g/dL (ref 30.0–36.0)
MCV: 91.5 fL (ref 80.0–100.0)
Monocytes Absolute: 0.3 10*3/uL (ref 0.1–1.0)
Monocytes Relative: 3 %
Neutro Abs: 6.8 10*3/uL (ref 1.7–7.7)
Neutrophils Relative %: 78 %
Platelets: 147 10*3/uL — ABNORMAL LOW (ref 150–400)
RBC: 3.19 MIL/uL — ABNORMAL LOW (ref 4.22–5.81)
RDW: 15.4 % (ref 11.5–15.5)
WBC: 8.7 10*3/uL (ref 4.0–10.5)
nRBC: 0 % (ref 0.0–0.2)
nRBC: 0 /100 WBC

## 2020-12-28 LAB — PHOSPHORUS: Phosphorus: 5.1 mg/dL — ABNORMAL HIGH (ref 2.5–4.6)

## 2020-12-28 LAB — MAGNESIUM: Magnesium: 2.4 mg/dL (ref 1.7–2.4)

## 2020-12-28 MED ORDER — HEPARIN SODIUM (PORCINE) 1000 UNIT/ML IJ SOLN
INTRAMUSCULAR | Status: AC
  Administered 2020-12-28: 1000 [IU]
  Filled 2020-12-28: qty 4

## 2020-12-28 MED ORDER — INSULIN GLARGINE-YFGN 100 UNIT/ML ~~LOC~~ SOLN
10.0000 [IU] | Freq: Every day | SUBCUTANEOUS | Status: DC
  Administered 2020-12-28: 10 [IU] via SUBCUTANEOUS
  Filled 2020-12-28 (×2): qty 0.1

## 2020-12-28 NOTE — Progress Notes (Signed)
PROGRESS NOTE    Kevin Meyer  PJK:[id number] DOB: 07-24-1970 DOA: 12/24/2020 PCP: Raiford Simmonds., PA-C   Brief Narrative:  Plan Kevin Meyer is a 50 year old gentleman with past history of essential hypertension, hyperlipidemia, OA, gout, anemia, type 2 diabetes mellitus, diabetic nephropathy, ESRD on HD MWF s/p right-sided tunneled catheter and left AV fistula, obesity, chronic systolic CHF [facility] with complaint of fever and left hand pain.  Was diagnosed with ulcers and vascular insufficiency as well as infection of the left fingers.  Transfer to [facility] for vascular consult.  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  Patient underwent angiogram/aortogram and selective catheterization of the left brachial artery with left upper extremity runoff.  Vein mapping is done.  Plan for DRIL procedure on Monday next week.  Patient did develop fever with a T-max of 101.2 with tachycardia afternoon of 12/26/2020 and met criteria for sepsis.  Per vascular surgery recommendation, continue vancomycin and Keflex.  Blood cultures were drawn and pending.  Lactic acid normal.  Has remained afebrile since then with the last temperature spike at 1 PM on 12/26/2020.  ESRD on HD: Per nephrology.  Hypokalemia: Resolved.  Essential hypertension: Controlled.  Continue current medications.  Type 2 diabetes mellitus with nephropathy: Hemoglobin A1c 7.2.  Continue SSI.  Hyperlipidemia: Continue statin.  Class II obesity:  Counseling for weight loss provided.  DVT prophylaxis: heparin injection 5,000 Units Start: 12/25/20 2200 SCDs Start: 12/24/20 1426   Code Status: Full Code  Family Communication:  None present at bedside.  Plan of care discussed with patient in length and he verbalized understanding and agreed with it.  Status is: Inpatient  Remains inpatient appropriate because:Ongoing diagnostic testing needed not appropriate for outpatient work up  Dispo: The patient is from: Home              Anticipated d/c is to: Home              Patient currently is not medically stable to d/c.   Difficult to place patient No        Estimated body mass index is 34.69 kg/m as calculated from the following:   Height as of this encounter: _0  (1.676 m).   Weight as of this encounter: 97.5 kg.     Nutritional Assessment: Body mass index is 34.69 kg/m.Marland Kitchen Seen by dietician.  I agree with the assessment and plan as outlined below: Nutrition Status: Nutrition Problem: Increased nutrient needs Etiology: wound healing Signs/Symptoms: estimated needs Interventions: Nepro shake, MVI  .  Skin Assessment: I have examined the patient's skin and I agree with the wound assessment as performed by the wound care RN as outlined below:    Consultants:  Vascular surgery  Procedures:  As above  Antimicrobials:  Anti-infectives (From admission, onward)    Start     Dose/Rate Route Frequency Ordered Stop   12/26/20 0745  vancomycin (VANCOCIN) IVPB 1000 mg/200 mL premix        1,000 mg 200 mL/hr  over 60 Minutes Intravenous  Once 12/26/20 0736 12/26/20 1030   12/25/20 2200  cephALEXin (KEFLEX) capsule 500 mg        500 mg Oral Every 12 hours 12/25/20 1056     12/25/20 1600  ceFEPIme (MAXIPIME) 2 g in sodium chloride 0.9 % 100 mL IVPB  Status:  Discontinued        2 g 200 mL/hr over 30 Minutes Intravenous Every M-W-F (Hemodialysis) 12/24/20 1233 12/25/20 1056   12/25/20 1600  vancomycin (VANCOCIN) IVPB  1000 mg/200 mL premix        1,000 mg 200 mL/hr over 60 Minutes Intravenous Every M-W-F (Hemodialysis) 12/24/20 1233     12/24/20 1000  vancomycin (VANCOREADY) IVPB 2000 mg/400 mL        2,000 mg 200 mL/hr over 120 Minutes Intravenous  Once 12/24/20 0946 12/24/20 1455   12/24/20 0945  ceFEPIme (MAXIPIME) 2 g in sodium chloride 0.9 % 100 mL IVPB        2 g 200 mL/hr over 30 Minutes Intravenous  Once 12/24/20 0941 12/24/20 1133   12/24/20 0945  vancomycin (VANCOCIN) IVPB 1000 mg/200 mL premix  Status:  Discontinued        1,000 mg 200 mL/hr over 60 Minutes Intravenous  Once 12/24/20 0941 12/24/20 0946   12/24/20 0945  metroNIDAZOLE (FLAGYL) IVPB 500 mg  Status:  Discontinued        500 mg 100 mL/hr over 60 Minutes Intravenous Every 12 hours 12/24/20 0941 12/25/20 1056          Subjective: Patient seen and examined.  No new complaint.  Objective: Vitals:   12/28/20 0004 12/28/20 0428 12/28/20 0737 12/28/20 1153  BP: 120/65 134/80 122/74 124/78  Pulse: 85 85 81 83  Resp: _0 Temp: 97.6 F (36.4 C) 97.6 F (36.4 C) 97.9 F (36.6 C) 97.7 F (36.5 C)  TempSrc: Oral Oral Oral Oral  SpO2: 99% 99% 99% 99%  Weight:      Height:        Intake/Output Summary (Last 24 hours) at 12/28/2020 1258 Last data filed at 12/28/2020 1200 Gross per 24 hour  Intake 480 ml  Output 405 ml  Net 75 ml    Filed Weights   12/25/20 1529 12/26/20 0705 12/26/20 1123  Weight: 99.6 kg 101.1 kg 97.5 kg    Examination:  General exam: Appears calm and comfortable  Respiratory system: Clear to auscultation. Respiratory effort normal. Cardiovascular system: S1 & S2 heard, RRR. No JVD, murmurs, rubs, gallops or clicks. No pedal edema. Gastrointestinal system: Abdomen is nondistended, soft and nontender. No organomegaly or masses felt. Normal bowel sounds heard. Central nervous system: Alert and oriented. No focal neurological deficits. Extremities: Symmetric 5 x 5 power. Skin: Ulceration of  the fingers of the left hand. Psychiatry: Judgement and insight appear normal. Mood & affect appropriate.   Data Reviewed: I have personally reviewed following labs and imaging studies  CBC: Recent Labs  Lab 12/24/20 1013 12/25/20 0214 12/25/20 1544 12/26/20 0717 12/27/20 0149 12/28/20 0116  WBC 5.5 5.3 5.8 5.3 11.5* 8.7  NEUTROABS 3.5 2.8  --  3.1 8.2* 6.8  HGB 11.3* 10.3* 11.8* 10.2* 10.7* 9.6*  HCT 35.0* 32.0* 35.9* 31.8* 32.5* 29.2*  MCV 93.3 92.2 91.1 91.9 91.5 91.5  PLT 126* PLATELET CLUMPS NOTED ON SMEAR, UNABLE TO ESTIMATE 124* 132* 133* 147*    Basic Metabolic Panel: Recent Labs  Lab 12/25/20 0214 12/25/20 1544 12/26/20 0717 12/27/20 0149 12/28/20  0116  NA 133* 134* 135 132* 133*  K 3.2* 3.8 3.4* 3.7 3.7  CL 94* 95* 97* 93* 94*  CO2 25 20* _0 GLUCOSE 151* 182* 87 156* 149*  BUN 57* 65* 69* 39* 66*  CREATININE 7.69* 8.16*  7.95* 8.92* 6.13* 7.63*  CALCIUM 8.5* 8.8* 8.7* 8.6* 8.7*  MG 2.1  --  2.2 2.1 2.4  PHOS 6.1* 6.4* 6.3* 4.1 5.1*    GFR: Estimated Creatinine Clearance: 12.7 mL/min (A) (by C-G formula based on SCr of 7.63 mg/dL (H)). Liver Function Tests: Recent Labs  Lab 12/24/20 1013 12/25/20 0214 12/25/20 1544 12/26/20 0717 12/27/20 0149 12/28/20 0116  AST 54* 46*  --  33 84* 90*  ALT 45* 40  --  33 77* 94*  ALKPHOS 58 46  --  49 63 54  BILITOT 0.5 0.7  --  0.7 0.6 0.3  PROT 7.2 6.2*  --  6.3* 6.5 6.2*  ALBUMIN 3.7 3.1* 3.4* 3.1* 3.1* 2.9*    No results for input(s): LIPASE, AMYLASE in the last 168 hours. No results for input(s): AMMONIA in the last 168 hours. Coagulation Profile: Recent Labs  Lab 12/24/20 1013  INR 1.0    Cardiac Enzymes: No results for input(s): CKTOTAL, CKMB, CKMBINDEX, TROPONINI in the last 168 hours. BNP (last 3 results) No results for input(s): PROBNP in the last 8760 hours. HbA1C: No results for input(s): HGBA1C in the last 72 hours.  CBG: Recent Labs  Lab 12/27/20 2043 12/28/20 0001  12/28/20 0426 12/28/20 0736 12/28/20 1151  GLUCAP 276* 179* 148* 169* 232*    Lipid Profile: No results for input(s): CHOL, HDL, LDLCALC, TRIG, CHOLHDL, LDLDIRECT in the last 72 hours. Thyroid Function Tests: No results for input(s): TSH, T4TOTAL, FREET4, T3FREE, THYROIDAB in the last 72 hours. Anemia Panel: No results for input(s): VITAMINB12, FOLATE, FERRITIN, TIBC, IRON, RETICCTPCT in the last 72 hours. Sepsis Labs: Recent Labs  Lab 12/24/20 1013 12/24/20 1143 12/26/20 1337  LATICACIDVEN 1.5 1.3 1.2     Recent Results (from the past 240 hour(s))  Blood Culture (routine x 2)     Status: None (Preliminary result)   Collection Time: 12/24/20 10:13 AM   Specimen: BLOOD RIGHT WRIST  Result Value Ref Range Status   Specimen Description BLOOD RIGHT WRIST  Final   Special Requests   Final    BOTTLES DRAWN AEROBIC AND ANAEROBIC Blood Culture adequate volume   Culture   Final    NO GROWTH 4 DAYS Performed at [facility], [address], [place], [place] [address]    Report Status PENDING  Incomplete  Blood Culture (routine x 2)     Status: None (Preliminary result)   Collection Time: 12/24/20 10:14 AM   Specimen: Right Antecubital; Blood  Result Value Ref Range Status   Specimen Description RIGHT ANTECUBITAL  Final   Special Requests   Final    BOTTLES DRAWN AEROBIC AND ANAEROBIC Blood Culture adequate volume   Culture   Final    NO GROWTH 4 DAYS Performed at [facility], [address] [place], [place] [address]    Report Status PENDING  Incomplete  Resp Panel by RT-PCR (Flu A&B, Covid) Nasopharyngeal Swab     Status: None   Collection Time: 12/24/20 11:08 AM   Specimen: Nasopharyngeal Swab; Nasopharyngeal(NP) swabs in vial transport medium  Result Value Ref Range Status   SARS Coronavirus 2 by RT PCR NEGATIVE NEGATIVE Final    Comment: (NOTE) SARS-CoV-2 target nucleic  acids are NOT DETECTED.  The SARS-CoV-2 RNA is generally detectable in upper  respiratory specimens during the acute phase of infection. The lowest concentration of SARS-CoV-2 viral copies this assay can detect is 138 copies/mL. A negative result does not preclude SARS-Cov-2 infection and should not be used as the sole basis for treatment or other patient management decisions. A negative result may occur with  improper specimen collection/handling, submission of specimen other than nasopharyngeal swab, presence of viral mutation(s) within the areas targeted by this assay, and inadequate number of viral copies(<138 copies/mL). A negative result must be combined with clinical observations, patient history, and epidemiological information. The expected result is Negative.  Fact Sheet for Patients:  EntrepreneurPulse.com.au  Fact Sheet for Healthcare Providers:  IncredibleEmployment.be  This test is no t yet approved or cleared by the [place] FDA and  has been authorized for detection and/or diagnosis of SARS-CoV-2 by FDA under an Emergency Use Authorization (EUA). This EUA will remain  in effect (meaning this test can be used) for the duration of the COVID-19 declaration under Section 564(b)(1) of the Act, 21 U.S.C.section 360bbb-3(b)(1), unless the authorization is terminated  or revoked sooner.       Influenza A by PCR NEGATIVE NEGATIVE Final   Influenza B by PCR NEGATIVE NEGATIVE Final    Comment: (NOTE) The Xpert Xpress SARS-CoV-2/FLU/RSV plus assay is intended as an aid in the diagnosis of influenza from Nasopharyngeal swab specimens and should not be used as a sole basis for treatment. Nasal washings and aspirates are unacceptable for Xpert Xpress SARS-CoV-2/FLU/RSV testing.  Fact Sheet for Patients: EntrepreneurPulse.com.au  Fact Sheet for Healthcare Providers: IncredibleEmployment.be  This test is not yet approved or cleared by the [place] FDA and has been  authorized for detection and/or diagnosis of SARS-CoV-2 by FDA under an Emergency Use Authorization (EUA). This EUA will remain in effect (meaning this test can be used) for the duration of the COVID-19 declaration under Section 564(b)(1) of the Act, 21 U.S.C. section 360bbb-3(b)(1), unless the authorization is terminated or revoked.  Performed at [facility], [address] [place], [place] [address]   Culture, blood (routine x 2)     Status: None (Preliminary result)   Collection Time: 12/26/20  1:52 PM   Specimen: BLOOD  Result Value Ref Range Status   Specimen Description BLOOD BLOOD LEFT FOREARM  Final   Special Requests   Final    BOTTLES DRAWN AEROBIC AND ANAEROBIC Blood Culture adequate volume   Culture   Final    NO GROWTH 2 DAYS Performed at [facility], [place] [address] [place], [place] [address]    Report Status PENDING  Incomplete  Culture, blood (routine x 2)     Status: None (Preliminary result)   Collection Time: 12/26/20  3:26 PM   Specimen: BLOOD  Result Value Ref Range Status   Specimen Description BLOOD RIGHT ANTECUBITAL  Final   Special Requests   Final    BOTTLES DRAWN AEROBIC ONLY Blood Culture adequate volume   Culture   Final    NO GROWTH 2 DAYS Performed at [facility], [place] [address], [place], [place] [address]    Report Status PENDING  Incomplete       Radiology Studies: No results found.  Scheduled Meds:  allopurinol  100 mg Oral Daily   aspirin EC  81 mg Oral Daily   atorvastatin  40 mg Oral QHS   cephALEXin  500 mg Oral Q12H  Chlorhexidine Gluconate Cloth  6 each Topical Q0600   cholecalciferol  2,000 Units Oral Daily   feeding supplement (NEPRO CARB STEADY)  237 mL Oral BID BM   folic acid  1 mg Oral Daily   heparin  5,000 Units Subcutaneous Q8H   insulin aspart  0-6 Units Subcutaneous Q4H   insulin glargine-yfgn  10 Units Subcutaneous Daily   midodrine  5 mg Oral Once per day on Mon Wed Fri   multivitamin  1 tablet  Oral QHS   sodium chloride flush  3 mL Intravenous Q12H   sucroferric oxyhydroxide  1,000 mg Oral TID WC   cyanocobalamin  1,000 mcg Oral Daily   Continuous Infusions:  sodium chloride     vancomycin       LOS: 4 days   Time spent: 24 minutes   Darliss Cheney, MD [facility]  12/28/2020, 12:58 PM   How to contact the [facility] Attending or Consulting provider [place] or covering provider during after hours [place], for this patient?  Check the care team in [facility] and look for a) attending/consulting [facility] provider listed and b) the [facility] team listed. Page or secure chat [phone]. Log into [email or web address] and use [place]'s universal password to access. If you do not have the password, please contact the hospital operator. Locate the [facility] provider you are looking for under [facility] and page to a number that you can be directly reached. If you still have difficulty reaching the provider, please page the [facility] (Director on Call) for the Hospitalists listed on amion for assistance.

## 2020-12-28 NOTE — Progress Notes (Signed)
   VASCULAR SURGERY ASSESSMENT & PLAN:   STEAL SYNDROME LEFT UPPER EXTREMITY: The patient is scheduled for distal revascularization and interval ligation of the left brachial artery on Wednesday with Dr. Stanford Breed.  I would agree that this is the best chance of improving circulation in the hand to heal the wounds on the fingers and also salvaging his fistula.  SUBJECTIVE:   No complaints.  PHYSICAL EXAM:   Vitals:   12/27/20 1742 12/27/20 1952 12/28/20 0004 12/28/20 0428  BP: (!) 121/56 123/65 120/65 134/80  Pulse: 86 82 85 85  Resp: '16 18 16 18  '$ Temp: 97.6 F (36.4 C) 97.6 F (36.4 C) 97.6 F (36.4 C) 97.6 F (36.4 C)  TempSrc: Oral Oral Oral Oral  SpO2:  99% 99% 99%  Weight:      Height:       He has a good thrill in his left upper arm fistula.  I cannot palpate a radial pulse.  He has Band-Aids over the wounds on his fingers.  LABS:   Lab Results  Component Value Date   WBC 8.7 12/28/2020   HGB 9.6 (L) 12/28/2020   HCT 29.2 (L) 12/28/2020   MCV 91.5 12/28/2020   PLT 147 (L) 12/28/2020   Lab Results  Component Value Date   CREATININE 7.63 (H) 12/28/2020   Lab Results  Component Value Date   INR 1.0 12/24/2020   CBG (last 3)  Recent Labs    12/27/20 2043 12/28/20 0001 12/28/20 0426  GLUCAP 276* 179* 148*    PROBLEM LIST:    Active Problems:   Hypertension   Type 2 diabetes with nephropathy (HCC)   Class 2 obesity due to excess calories with body mass index (BMI) of 39.0 to 39.9 in adult   Hyperlipidemia   Osteoarthritis   Diabetes mellitus type 2, controlled, with complications (HCC)   Infection of finger LEFT   Finger infection   CURRENT MEDS:    allopurinol  100 mg Oral Daily   aspirin EC  81 mg Oral Daily   atorvastatin  40 mg Oral QHS   cephALEXin  500 mg Oral Q12H   Chlorhexidine Gluconate Cloth  6 each Topical Q0600   cholecalciferol  2,000 Units Oral Daily   feeding supplement (NEPRO CARB STEADY)  237 mL Oral BID BM   folic acid  1 mg  Oral Daily   heparin  5,000 Units Subcutaneous Q8H   insulin aspart  0-6 Units Subcutaneous Q4H   midodrine  5 mg Oral Once per day on Mon Wed Fri   multivitamin  1 tablet Oral QHS   sodium chloride flush  3 mL Intravenous Q12H   sucroferric oxyhydroxide  1,000 mg Oral TID WC   cyanocobalamin  1,000 mcg Oral Daily    Deitra Mayo Office: [phone] 12/28/2020

## 2020-12-28 NOTE — Progress Notes (Signed)
[facility] Progress Note   Subjective:    Seen and examined patient at bedside. Reports feeling better-denies SOB and N/V at this time. Plan for HD today and will watch closely for symptoms: fevers, chills, and rigors. This has occurred during previous HD and at outpatient. Currently afebrile and blood cultures (-). Plan for revascularization and interval ligation of L brachial artery on 12/30/20 via Dr. Stanford Breed.  Objective Vitals:   12/28/20 0004 12/28/20 0428 12/28/20 0737 12/28/20 1153  BP: 120/65 134/80 122/74 124/78  Pulse: 85 85 81 83  Resp: '16 18 17 17  '$ Temp: 97.6 F (36.4 C) 97.6 F (36.4 C) 97.9 F (36.6 C) 97.7 F (36.5 C)  TempSrc: Oral Oral Oral Oral  SpO2: 99% 99% 99% 99%  Weight:      Height:       Physical Exam General: Well-appearing male; NAD Heart: S1 and S2; No murmurs, gallops, or rubs Lungs: Clear throughout, no wheezing, rales, or rhonchi Abdomen: Soft and non-tender Extremities: No edema BLLE Dialysis Access: R TDC; L AVF (+) bruit/thrill; ischemic ulcers L 2nd/5th fingers   Filed Weights   12/25/20 1529 12/26/20 0705 12/26/20 1123  Weight: 99.6 kg 101.1 kg 97.5 kg    Intake/Output Summary (Last 24 hours) at 12/28/2020 1316 Last data filed at 12/28/2020 1200 Gross per 24 hour  Intake 480 ml  Output 405 ml  Net 75 ml    Additional Objective Labs: Basic Metabolic Panel: Recent Labs  Lab 12/26/20 0717 12/27/20 0149 12/28/20 0116  NA 135 132* 133*  K 3.4* 3.7 3.7  CL 97* 93* 94*  CO2 '23 27 28  '$ GLUCOSE 87 156* 149*  BUN 69* 39* 66*  CREATININE 8.92* 6.13* 7.63*  CALCIUM 8.7* 8.6* 8.7*  PHOS 6.3* 4.1 5.1*   Liver Function Tests: Recent Labs  Lab 12/26/20 0717 12/27/20 0149 12/28/20 0116  AST 33 84* 90*  ALT 33 77* 94*  ALKPHOS 49 63 54  BILITOT 0.7 0.6 0.3  PROT 6.3* 6.5 6.2*  ALBUMIN 3.1* 3.1* 2.9*   No results for input(s): LIPASE, AMYLASE in the last 168 hours. CBC: Recent Labs  Lab 12/25/20 0214  12/25/20 1544 12/26/20 0717 12/27/20 0149 12/28/20 0116  WBC 5.3 5.8 5.3 11.5* 8.7  NEUTROABS 2.8  --  3.1 8.2* 6.8  HGB 10.3* 11.8* 10.2* 10.7* 9.6*  HCT 32.0* 35.9* 31.8* 32.5* 29.2*  MCV 92.2 91.1 91.9 91.5 91.5  PLT PLATELET CLUMPS NOTED ON SMEAR, UNABLE TO ESTIMATE 124* 132* 133* 147*   Blood Culture    Component Value Date/Time   SDES BLOOD RIGHT ANTECUBITAL 12/26/2020 1526   SPECREQUEST  12/26/2020 1526    BOTTLES DRAWN AEROBIC ONLY Blood Culture adequate volume   CULT  12/26/2020 1526    NO GROWTH 2 DAYS Performed at [facility], [place] [address] [place], [place] [address]    REPTSTATUS PENDING 12/26/2020 1526    Cardiac Enzymes: No results for input(s): CKTOTAL, CKMB, CKMBINDEX, TROPONINI in the last 168 hours. CBG: Recent Labs  Lab 12/27/20 2043 12/28/20 0001 12/28/20 0426 12/28/20 0736 12/28/20 1151  GLUCAP 276* 179* 148* 169* 232*   Iron Studies: No results for input(s): IRON, TIBC, TRANSFERRIN, FERRITIN in the last 72 hours. Lab Results  Component Value Date   INR 1.0 12/24/2020   INR 1.3 (H) 06/23/2020   Studies/Results: No results found.  Medications:  sodium chloride     vancomycin      allopurinol  100 mg Oral Daily  aspirin EC  81 mg Oral Daily   atorvastatin  40 mg Oral QHS   cephALEXin  500 mg Oral Q12H   Chlorhexidine Gluconate Cloth  6 each Topical Q0600   cholecalciferol  2,000 Units Oral Daily   feeding supplement (NEPRO CARB STEADY)  237 mL Oral BID BM   folic acid  1 mg Oral Daily   heparin  5,000 Units Subcutaneous Q8H   insulin aspart  0-6 Units Subcutaneous Q4H   insulin glargine-yfgn  10 Units Subcutaneous Daily   midodrine  5 mg Oral Once per day on Mon Wed Fri   multivitamin  1 tablet Oral QHS   sodium chloride flush  3 mL Intravenous Q12H   sucroferric oxyhydroxide  1,000 mg Oral TID WC   cyanocobalamin  1,000 mcg Oral Daily    Dialysis Orders: MWF at [facility] 4hr, 400/600, EDW 99.5kg, 2K/2.5Ca, AVF + TDC,  heparin 5000 - Calcitriol 0.26mg PO q HD - No ESA  Assessment/Plan: 1. Chills/rigors/vomiting: Symptoms occurred near end of tx on 9/3, then would resolve after HD. This was occurring at outpatient as well. Blood Cx 9/1, 9/4 negative. CXR clear. Unclear etiology. Plan for HD today: Continue UFP #2 and ensure patient is pre-medicated with zofran and midodrine pre - HD. Also, consider abdominal imaging and cardiac testing (stress test, ?could this be angina equiv) 2. 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 On IV Vanc + PO Keflex. 3. ESRD: Back to usual MWF schedule - HD today. K+ level 3.7-continue 3K bath. 4. HTN/volume: BP low/stable, follow. 5. Anemia: Hgb 9.6, no ESA for now. 6. Secondary hyperparathyroidism: Ca ok, Phos high - continue Velphoro, dose recently raised. 7. Nutrition: Alb low, continue protein supplements. 8. T2DM  CTobie Poet NP [facility] 12/28/2020,1:16 PM  LOS: 4 days

## 2020-12-29 ENCOUNTER — Inpatient Hospital Stay (HOSPITAL_COMMUNITY): Payer: Medicaid Other

## 2020-12-29 ENCOUNTER — Encounter (HOSPITAL_COMMUNITY): Payer: Self-pay | Admitting: Vascular Surgery

## 2020-12-29 DIAGNOSIS — L0390 Cellulitis, unspecified: Secondary | ICD-10-CM | POA: Diagnosis not present

## 2020-12-29 DIAGNOSIS — L089 Local infection of the skin and subcutaneous tissue, unspecified: Secondary | ICD-10-CM

## 2020-12-29 LAB — CULTURE, BLOOD (ROUTINE X 2)
Culture: NO GROWTH
Culture: NO GROWTH
Special Requests: ADEQUATE
Special Requests: ADEQUATE

## 2020-12-29 LAB — CBC WITH DIFFERENTIAL/PLATELET
Abs Immature Granulocytes: 0.08 10*3/uL — ABNORMAL HIGH (ref 0.00–0.07)
Basophils Absolute: 0 10*3/uL (ref 0.0–0.1)
Basophils Relative: 0 %
Eosinophils Absolute: 0.1 10*3/uL (ref 0.0–0.5)
Eosinophils Relative: 1 %
HCT: 30.7 % — ABNORMAL LOW (ref 39.0–52.0)
Hemoglobin: 10 g/dL — ABNORMAL LOW (ref 13.0–17.0)
Immature Granulocytes: 1 %
Lymphocytes Relative: 10 %
Lymphs Abs: 1 10*3/uL (ref 0.7–4.0)
MCH: 29.9 pg (ref 26.0–34.0)
MCHC: 32.6 g/dL (ref 30.0–36.0)
MCV: 91.9 fL (ref 80.0–100.0)
Monocytes Absolute: 0.5 10*3/uL (ref 0.1–1.0)
Monocytes Relative: 5 %
Neutro Abs: 8.9 10*3/uL — ABNORMAL HIGH (ref 1.7–7.7)
Neutrophils Relative %: 83 %
Platelets: 180 10*3/uL (ref 150–400)
RBC: 3.34 MIL/uL — ABNORMAL LOW (ref 4.22–5.81)
RDW: 15.4 % (ref 11.5–15.5)
WBC: 10.6 10*3/uL — ABNORMAL HIGH (ref 4.0–10.5)
nRBC: 0 % (ref 0.0–0.2)

## 2020-12-29 LAB — GLUCOSE, CAPILLARY
Glucose-Capillary: 122 mg/dL — ABNORMAL HIGH (ref 70–99)
Glucose-Capillary: 143 mg/dL — ABNORMAL HIGH (ref 70–99)
Glucose-Capillary: 207 mg/dL — ABNORMAL HIGH (ref 70–99)
Glucose-Capillary: 235 mg/dL — ABNORMAL HIGH (ref 70–99)
Glucose-Capillary: 246 mg/dL — ABNORMAL HIGH (ref 70–99)
Glucose-Capillary: 256 mg/dL — ABNORMAL HIGH (ref 70–99)

## 2020-12-29 LAB — COMPREHENSIVE METABOLIC PANEL
ALT: 120 U/L — ABNORMAL HIGH (ref 0–44)
AST: 111 U/L — ABNORMAL HIGH (ref 15–41)
Albumin: 3 g/dL — ABNORMAL LOW (ref 3.5–5.0)
Alkaline Phosphatase: 72 U/L (ref 38–126)
Anion gap: 8 (ref 5–15)
BUN: 38 mg/dL — ABNORMAL HIGH (ref 6–20)
CO2: 29 mmol/L (ref 22–32)
Calcium: 8.8 mg/dL — ABNORMAL LOW (ref 8.9–10.3)
Chloride: 96 mmol/L — ABNORMAL LOW (ref 98–111)
Creatinine, Ser: 5.56 mg/dL — ABNORMAL HIGH (ref 0.61–1.24)
GFR, Estimated: 12 mL/min — ABNORMAL LOW (ref >60)
Glucose, Bld: 241 mg/dL — ABNORMAL HIGH (ref 70–99)
Potassium: 4.7 mmol/L (ref 3.5–5.1)
Sodium: 133 mmol/L — ABNORMAL LOW (ref 135–145)
Total Bilirubin: 0.6 mg/dL (ref 0.3–1.2)
Total Protein: 6.5 g/dL (ref 6.5–8.1)

## 2020-12-29 LAB — PHOSPHORUS: Phosphorus: 2.2 mg/dL — ABNORMAL LOW (ref 2.5–4.6)

## 2020-12-29 LAB — SURGICAL PCR SCREEN
MRSA, PCR: NEGATIVE
Staphylococcus aureus: NEGATIVE

## 2020-12-29 LAB — MAGNESIUM: Magnesium: 2.1 mg/dL (ref 1.7–2.4)

## 2020-12-29 MED ORDER — PENTAFLUOROPROP-TETRAFLUOROETH EX AERO: 1.0000 "application " | INHALATION_SPRAY | CUTANEOUS | Status: DC | PRN

## 2020-12-29 MED ORDER — INSULIN GLARGINE-YFGN 100 UNIT/ML ~~LOC~~ SOLN
20.0000 [IU] | Freq: Every day | SUBCUTANEOUS | Status: DC
  Administered 2020-12-29 – 2021-01-05 (×7): 20 [IU] via SUBCUTANEOUS
  Filled 2020-12-29 (×8): qty 0.2

## 2020-12-29 MED ORDER — SODIUM CHLORIDE 0.9 % IV SOLN: 100.0000 mL | INTRAVENOUS | Status: DC | PRN

## 2020-12-29 MED ORDER — LIDOCAINE HCL (PF) 1 % IJ SOLN: 5.0000 mL | INTRAMUSCULAR | Status: DC | PRN

## 2020-12-29 MED ORDER — LIDOCAINE-PRILOCAINE 2.5-2.5 % EX CREA
1.0000 "application " | TOPICAL_CREAM | CUTANEOUS | Status: DC | PRN
  Filled 2020-12-29: qty 5

## 2020-12-29 MED ORDER — ALTEPLASE 2 MG IJ SOLR
2.0000 mg | Freq: Once | INTRAMUSCULAR | Status: DC | PRN
  Filled 2020-12-29: qty 2

## 2020-12-29 MED ORDER — CHLORHEXIDINE GLUCONATE CLOTH 2 % EX PADS
6.0000 | MEDICATED_PAD | Freq: Every day | CUTANEOUS | Status: DC
  Administered 2020-12-30 – 2021-01-04 (×6): 6 via TOPICAL

## 2020-12-29 MED ORDER — HEPARIN SODIUM (PORCINE) 1000 UNIT/ML DIALYSIS
1000.0000 [IU] | INTRAMUSCULAR | Status: DC | PRN
  Filled 2020-12-29: qty 1

## 2020-12-29 NOTE — Progress Notes (Signed)
BP 89/54, HR 86, siting in chair.  Patient denies dizziness. Eating breakfast.  Dr. Doristine Bosworth notified.

## 2020-12-29 NOTE — Progress Notes (Signed)
  Progress Note    12/29/2020 8:37 AM 4 Days Post-Op  Subjective:  Febrile to 103 yesterday. He says he dialyzed for 3 hours without complications and afterward developed chills. No complaints this morning.   Vitals:   12/29/20 0037 12/29/20 0409  BP:  (!) 107/54  Pulse:  90  Resp:  19  Temp: 99.8 F (37.7 C) 98.4 F (36.9 C)  SpO2:  95%    Physical Exam: Awake, alert and oriented in NAD LUE: Stable left 2nd and 5th digit ulcers. Dry, no drainage.     CBC    Component Value Date/Time   WBC 10.6 (H) 12/29/2020 0248   RBC 3.34 (L) 12/29/2020 0248   HGB 10.0 (L) 12/29/2020 0248   HCT 30.7 (L) 12/29/2020 0248   PLT 180 12/29/2020 0248   MCV 91.9 12/29/2020 0248   MCH 29.9 12/29/2020 0248   MCHC 32.6 12/29/2020 0248   RDW 15.4 12/29/2020 0248   LYMPHSABS 1.0 12/29/2020 0248   MONOABS 0.5 12/29/2020 0248   EOSABS 0.1 12/29/2020 0248   BASOSABS 0.0 12/29/2020 0248    BMET    Component Value Date/Time   NA 133 (L) 12/29/2020 0248   K 4.7 12/29/2020 0248   CL 96 (L) 12/29/2020 0248   CO2 29 12/29/2020 0248   GLUCOSE 241 (H) 12/29/2020 0248   BUN 38 (H) 12/29/2020 0248   CREATININE 5.56 (H) 12/29/2020 0248   CALCIUM 8.8 (L) 12/29/2020 0248   GFRNONAA 12 (L) 12/29/2020 0248   GFRAA 50 (L) 09/10/2017 0449     Intake/Output Summary (Last 24 hours) at 12/29/2020 0837 Last data filed at 12/28/2020 2148 Gross per 24 hour  Intake 774.67 ml  Output 700 ml  Net 74.67 ml    HOSPITAL MEDICATIONS Scheduled Meds:  allopurinol  100 mg Oral Daily   aspirin EC  81 mg Oral Daily   atorvastatin  40 mg Oral QHS   cephALEXin  500 mg Oral Q12H   Chlorhexidine Gluconate Cloth  6 each Topical Q0600   cholecalciferol  2,000 Units Oral Daily   feeding supplement (NEPRO CARB STEADY)  237 mL Oral BID BM   folic acid  1 mg Oral Daily   heparin  5,000 Units Subcutaneous Q8H   insulin aspart  0-6 Units Subcutaneous Q4H   insulin glargine-yfgn  20 Units Subcutaneous Daily    midodrine  5 mg Oral Once per day on Mon Wed Fri   multivitamin  1 tablet Oral QHS   sodium chloride flush  3 mL Intravenous Q12H   sucroferric oxyhydroxide  1,000 mg Oral TID WC   cyanocobalamin  1,000 mcg Oral Daily   Continuous Infusions:  sodium chloride     vancomycin Stopped (12/28/20 2015)   PRN Meds:.sodium chloride, acetaminophen, hydrALAZINE, labetalol, ondansetron **OR** ondansetron (ZOFRAN) IV, ondansetron (ZOFRAN) IV, oxyCODONE, sodium chloride flush, sorbitol  Assessment and Plan: Febrile overnight. Afebrile this morning. Continue antibiotics. Steal syndrome LUE:  The patient is scheduled for distal revascularization and interval ligation of the left brachial artery tomorrow with Dr. Stanford Breed. NPO after MN  -DVT prophylaxis:  heparin [place]   Risa Grill, PA-C [facility] [phone] 12/29/2020  8:37 AM

## 2020-12-29 NOTE — Progress Notes (Signed)
PROGRESS NOTE    Kevin Meyer  ZOX:[id number] DOB: Nov 04, 1970 DOA: 12/24/2020 PCP: Kevin Meyer., PA-C   Brief Narrative:  Plan Kevin Meyer is a 50 year old gentleman with past history of essential hypertension, hyperlipidemia, OA, gout, anemia, type 2 diabetes mellitus, diabetic nephropathy, ESRD on HD MWF s/p right-sided tunneled catheter and left AV fistula, obesity, chronic systolic CHF [facility] with complaint of fever and left hand pain.  Was diagnosed with ulcers and vascular insufficiency as well as infection of the left fingers.  Transferred to [facility] for vascular consult.  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  Patient underwent angiogram/aortogram and selective catheterization of the left brachial artery with left upper extremity runoff.  Vein mapping is done.  Plan for DRIL procedure tomorrow.  Patient did develop fever with a T-max of 101.2 with tachycardia afternoon of 12/26/2020 and met criteria for sepsis.  Per vascular surgery recommendation, continue vancomycin and Keflex.  Blood cultures were drawn and pending.  Lactic acid normal.  Has remained afebrile until 10 PM of 12/28/2020 when he had T-max of 103.  ESRD on HD: Per nephrology.  Hypokalemia: Resolved.  Hypotension: Chronic and at baseline without symptoms.  He is on midodrine.  Will discontinue labetalol.  Type 2 diabetes mellitus with nephropathy: Hemoglobin A1c 7.2.  Continue SSI.  Hyperlipidemia: Continue statin.  Class II  obesity: Counseling for weight loss provided.  DVT prophylaxis: heparin injection 5,000 Units Start: 12/25/20 2200 SCDs Start: 12/24/20 1426   Code Status: Full Code  Family Communication:  None present at bedside.  Plan of care discussed with patient in length and he verbalized understanding and agreed with it.  Status is: Inpatient  Remains inpatient appropriate because:Ongoing diagnostic testing needed not appropriate for outpatient work up  Dispo: The patient is from: Home              Anticipated d/c is to: Home              Patient currently is not medically stable to d/c.   Difficult to place patient No        Estimated body mass index is 35.94 kg/m as calculated from the following:   Height as of this encounter: '5\' 6"'  (1.676 m).   Weight as of this encounter: 101 kg.     Nutritional Assessment: Body mass index is 35.94 kg/m.Marland Kitchen Seen by dietician.  I agree with the assessment and plan as outlined below: Nutrition Status: Nutrition Problem: Increased nutrient needs Etiology: wound healing Signs/Symptoms: estimated needs Interventions: Nepro shake, MVI  .  Skin Assessment: I have examined the patient's skin and I agree with the wound assessment as performed by the wound care RN as outlined below:    Consultants:  Vascular surgery  Procedures:  As above  Antimicrobials:  Anti-infectives (From admission, onward)    Start     Dose/Rate Route Frequency Ordered Stop   12/26/20 0745  vancomycin (VANCOCIN) IVPB 1000 mg/200 mL premix  1,000 mg 200 mL/hr over 60 Minutes Intravenous  Once 12/26/20 0736 12/26/20 1030   12/25/20 2200  cephALEXin (KEFLEX) capsule 500 mg        500 mg Oral Every 12 hours 12/25/20 1056     12/25/20 1600  ceFEPIme (MAXIPIME) 2 g in sodium chloride 0.9 % 100 mL IVPB  Status:  Discontinued        2 g 200 mL/hr over 30 Minutes Intravenous Every M-W-F (Hemodialysis) 12/24/20 1233 12/25/20 1056   12/25/20 1600  vancomycin (VANCOCIN)  IVPB 1000 mg/200 mL premix        1,000 mg 200 mL/hr over 60 Minutes Intravenous Every M-W-F (Hemodialysis) 12/24/20 1233     12/24/20 1000  vancomycin (VANCOREADY) IVPB 2000 mg/400 mL        2,000 mg 200 mL/hr over 120 Minutes Intravenous  Once 12/24/20 0946 12/24/20 1455   12/24/20 0945  ceFEPIme (MAXIPIME) 2 g in sodium chloride 0.9 % 100 mL IVPB        2 g 200 mL/hr over 30 Minutes Intravenous  Once 12/24/20 0941 12/24/20 1133   12/24/20 0945  vancomycin (VANCOCIN) IVPB 1000 mg/200 mL premix  Status:  Discontinued        1,000 mg 200 mL/hr over 60 Minutes Intravenous  Once 12/24/20 0941 12/24/20 0946   12/24/20 0945  metroNIDAZOLE (FLAGYL) IVPB 500 mg  Status:  Discontinued        500 mg 100 mL/hr over 60 Minutes Intravenous Every 12 hours 12/24/20 0941 12/25/20 1056          Subjective: Seen and examined.  Continues to complain of pain in the fingers, no better or worse than yesterday.  No other complaint.  Objective: Vitals:   12/29/20 0037 12/29/20 0409 12/29/20 0845 12/29/20 0901  BP:  (!) 107/54 (!) 88/46 (!) 89/54  Pulse:  90 86 87  Resp:  '19 18 18  ' Temp: 99.8 F (37.7 C) 98.4 F (36.9 C) 98.6 F (37 C)   TempSrc: Oral Oral Oral   SpO2:  95% 98%   Weight:      Height:        Intake/Output Summary (Last 24 hours) at 12/29/2020 1138 Last data filed at 12/29/2020 0908 Gross per 24 hour  Intake 774.67 ml  Output 700 ml  Net 74.67 ml    Filed Weights   12/26/20 1123 12/28/20 1715 12/28/20 2015  Weight: 97.5 kg 101.7 kg 101 kg    Examination:  General exam: Appears calm and comfortable  Respiratory system: Clear to auscultation. Respiratory effort normal. Cardiovascular system: S1 & S2 heard, RRR. No JVD, murmurs, rubs, gallops or clicks. No pedal edema. Gastrointestinal system: Abdomen is nondistended, soft and nontender. No organomegaly or masses felt. Normal bowel sounds heard. Central nervous system: Alert and oriented. No focal neurological  deficits. Extremities: Symmetric 5 x 5 power. Skin: Ulceration of the fingers of the left hand. Psychiatry: Judgement and insight appear normal. Mood & affect appropriate.    Data Reviewed: I have personally reviewed following labs and imaging studies  CBC: Recent Labs  Lab 12/25/20 0214 12/25/20 1544 12/26/20 0717 12/27/20 0149 12/28/20 0116 12/29/20 0248  WBC 5.3 5.8 5.3 11.5* 8.7 10.6*  NEUTROABS 2.8  --  3.1 8.2* 6.8 8.9*  HGB 10.3* 11.8* 10.2* 10.7* 9.6* 10.0*  HCT 32.0* 35.9* 31.8* 32.5* 29.2* 30.7*  MCV 92.2 91.1 91.9 91.5 91.5 91.9  PLT PLATELET CLUMPS NOTED ON SMEAR, UNABLE TO ESTIMATE 124* 132* 133* 147* 180  Basic Metabolic Panel: Recent Labs  Lab 12/25/20 0214 12/25/20 1544 12/26/20 0717 12/27/20 0149 12/28/20 0116 12/29/20 0248  NA 133* 134* 135 132* 133* 133*  K 3.2* 3.8 3.4* 3.7 3.7 4.7  CL 94* 95* 97* 93* 94* 96*  CO2 25 20* '23 27 28 29  ' GLUCOSE 151* 182* 87 156* 149* 241*  BUN 57* 65* 69* 39* 66* 38*  CREATININE 7.69* 8.16*  7.95* 8.92* 6.13* 7.63* 5.56*  CALCIUM 8.5* 8.8* 8.7* 8.6* 8.7* 8.8*  MG 2.1  --  2.2 2.1 2.4 2.1  PHOS 6.1* 6.4* 6.3* 4.1 5.1* 2.2*    GFR: Estimated Creatinine Clearance: 17.7 mL/min (A) (by C-G formula based on SCr of 5.56 mg/dL (H)). Liver Function Tests: Recent Labs  Lab 12/25/20 0214 12/25/20 1544 12/26/20 0717 12/27/20 0149 12/28/20 0116 12/29/20 0248  AST 46*  --  33 84* 90* 111*  ALT 40  --  33 77* 94* 120*  ALKPHOS 46  --  49 63 54 72  BILITOT 0.7  --  0.7 0.6 0.3 0.6  PROT 6.2*  --  6.3* 6.5 6.2* 6.5  ALBUMIN 3.1* 3.4* 3.1* 3.1* 2.9* 3.0*    No results for input(s): LIPASE, AMYLASE in the last 168 hours. No results for input(s): AMMONIA in the last 168 hours. Coagulation Profile: Recent Labs  Lab 12/24/20 1013  INR 1.0    Cardiac Enzymes: No results for input(s): CKTOTAL, CKMB, CKMBINDEX, TROPONINI in the last 168 hours. BNP (last 3 results) No results for input(s): PROBNP in the last 8760  hours. HbA1C: No results for input(s): HGBA1C in the last 72 hours.  CBG: Recent Labs  Lab 12/28/20 1555 12/28/20 2107 12/29/20 0035 12/29/20 0407 12/29/20 0844  GLUCAP 169* 190* 246* 207* 143*    Lipid Profile: No results for input(s): CHOL, HDL, LDLCALC, TRIG, CHOLHDL, LDLDIRECT in the last 72 hours. Thyroid Function Tests: No results for input(s): TSH, T4TOTAL, FREET4, T3FREE, THYROIDAB in the last 72 hours. Anemia Panel: No results for input(s): VITAMINB12, FOLATE, FERRITIN, TIBC, IRON, RETICCTPCT in the last 72 hours. Sepsis Labs: Recent Labs  Lab 12/24/20 1013 12/24/20 1143 12/26/20 1337  LATICACIDVEN 1.5 1.3 1.2     Recent Results (from the past 240 hour(s))  Blood Culture (routine x 2)     Status: None   Collection Time: 12/24/20 10:13 AM   Specimen: BLOOD RIGHT WRIST  Result Value Ref Range Status   Specimen Description BLOOD RIGHT WRIST  Final   Special Requests   Final    BOTTLES DRAWN AEROBIC AND ANAEROBIC Blood Culture adequate volume   Culture   Final    NO GROWTH 5 DAYS Performed at [facility], [address] [place], [place] [address]    Report Status 12/29/2020 FINAL  Final  Blood Culture (routine x 2)     Status: None   Collection Time: 12/24/20 10:14 AM   Specimen: Right Antecubital; Blood  Result Value Ref Range Status   Specimen Description RIGHT ANTECUBITAL  Final   Special Requests   Final    BOTTLES DRAWN AEROBIC AND ANAEROBIC Blood Culture adequate volume   Culture   Final    NO GROWTH 5 DAYS Performed at [facility], [address] [place], [place] [address]    Report Status 12/29/2020 FINAL  Final  Resp Panel by RT-PCR (Flu A&B, Covid) Nasopharyngeal Swab     Status: None   Collection Time: 12/24/20 11:08 AM   Specimen: Nasopharyngeal Swab; Nasopharyngeal(NP) swabs in vial transport  medium  Result Value Ref Range Status   SARS Coronavirus 2 by RT PCR NEGATIVE NEGATIVE Final    Comment: (NOTE) SARS-CoV-2 target nucleic acids  are NOT DETECTED.  The SARS-CoV-2 RNA is generally detectable in upper respiratory specimens during the acute phase of infection. The lowest concentration of SARS-CoV-2 viral copies this assay can detect is 138 copies/mL. A negative result does not preclude SARS-Cov-2 infection and should not be used as the sole basis for treatment or other patient management decisions. A negative result may occur with  improper specimen collection/handling, submission of specimen other than nasopharyngeal swab, presence of viral mutation(s) within the areas targeted by this assay, and inadequate number of viral copies(<138 copies/mL). A negative result must be combined with clinical observations, patient history, and epidemiological information. The expected result is Negative.  Fact Sheet for Patients:  EntrepreneurPulse.com.au  Fact Sheet for Healthcare Providers:  IncredibleEmployment.be  This test is no t yet approved or cleared by the [place] FDA and  has been authorized for detection and/or diagnosis of SARS-CoV-2 by FDA under an Emergency Use Authorization (EUA). This EUA will remain  in effect (meaning this test can be used) for the duration of the COVID-19 declaration under Section 564(b)(1) of the Act, 21 U.S.C.section 360bbb-3(b)(1), unless the authorization is terminated  or revoked sooner.       Influenza A by PCR NEGATIVE NEGATIVE Final   Influenza B by PCR NEGATIVE NEGATIVE Final    Comment: (NOTE) The Xpert Xpress SARS-CoV-2/FLU/RSV plus assay is intended as an aid in the diagnosis of influenza from Nasopharyngeal swab specimens and should not be used as a sole basis for treatment. Nasal washings and aspirates are unacceptable for Xpert Xpress SARS-CoV-2/FLU/RSV testing.  Fact Sheet for Patients: EntrepreneurPulse.com.au  Fact Sheet for Healthcare Providers: IncredibleEmployment.be  This test is  not yet approved or cleared by the [place] FDA and has been authorized for detection and/or diagnosis of SARS-CoV-2 by FDA under an Emergency Use Authorization (EUA). This EUA will remain in effect (meaning this test can be used) for the duration of the COVID-19 declaration under Section 564(b)(1) of the Act, 21 U.S.C. section 360bbb-3(b)(1), unless the authorization is terminated or revoked.  Performed at [facility], [address], [place], [place] [address]   Culture, blood (routine x 2)     Status: None (Preliminary result)   Collection Time: 12/26/20  1:52 PM   Specimen: BLOOD  Result Value Ref Range Status   Specimen Description BLOOD BLOOD LEFT FOREARM  Final   Special Requests   Final    BOTTLES DRAWN AEROBIC AND ANAEROBIC Blood Culture adequate volume   Culture   Final    NO GROWTH 3 DAYS Performed at [facility], [place] [address], [place], [place] [address]    Report Status PENDING  Incomplete  Culture, blood (routine x 2)     Status: None (Preliminary result)   Collection Time: 12/26/20  3:26 PM   Specimen: BLOOD  Result Value Ref Range Status   Specimen Description BLOOD RIGHT ANTECUBITAL  Final   Special Requests   Final    BOTTLES DRAWN AEROBIC ONLY Blood Culture adequate volume   Culture   Final    NO GROWTH 3 DAYS Performed at [facility], [address] [place], [place] [address]    Report Status PENDING  Incomplete       Radiology Studies: VAS US DUPLEX DIALYSIS ACCESS (AVF, AVG)  Result Date: 12/29/2020 DIALYSIS ACCESS  Patient Name:  SARIM ROTHMAN  Date of Exam:   12/29/2020 Medical Rec #: [id number]    Accession #:    [id number] Date of Birth: [birth year]     Patient Gender: M Patient Age:   89 years Exam Location:  [facility] Procedure:      VAS US DUPLEX DIALYSIS ACCESS (AVF, AVG) Referring Phys: Jamelle Haring --------------------------------------------------------------------------------  Reason for Exam: Left digit ulcers. Access  Site: Left Upper Extremity. Access Type: Brachial-cephalic AVF. Comparison Study: No prior study Performing Technologist: Maudry Mayhew MHA, RDMS, RVT, RDCS  Examination Guidelines: A complete evaluation includes B-mode imaging, spectral Doppler, color Doppler, and power Doppler as needed of all accessible portions of each vessel. Unilateral testing is considered an integral part of a complete examination. Limited examinations for reoccurring indications may be performed as noted.  Findings: +--------------------+----------+-----------------+--------+ AVF                 PSV (cm/s)Flow Vol (mL/min)Comments +--------------------+----------+-----------------+--------+ Native artery inflow   181           259                +--------------------+----------+-----------------+--------+ AVF Anastomosis        162                              +--------------------+----------+-----------------+--------+  +------------+---------+------------+----------+-------------------------------+ OUTFLOW VEIN   PSV     Diameter  Depth (cm)           Describe                          (cm/s)      (cm)                                              +------------+---------+------------+----------+-------------------------------+ Prox UA        404       0.47                                              +------------+---------+------------+----------+-------------------------------+ Mid UA         116       0.69                competing branch measuring                                                           0.37cm              +------------+---------+------------+----------+-------------------------------+ Dist UA        240       0.81                                              +------------+---------+------------+----------+-------------------------------+ Left palmar arch= 57cm/s without AVF compression, 121cm/s with AVF compression   Summary: Patent left brachiocephalic AVF. There  is  evidence of a competing branch. PW Doppler of left palmar arch with and without compression reveals evidence of steal syndrome.  *See table(s) above for measurements and observations.      --------------------------------------------------------------------------------   Preliminary    VAS [place] STEAL EXAM  Result Date: 12/29/2020 DIALYSIS STEAL  Patient Name:  BOBAK OGUINN  Date of Exam:   12/29/2020 Medical Rec #: [id number]    Accession #:    [id number] Date of Birth: 30-Dec-1970     Patient Gender: M Patient Age:   29 years Exam Location:  [facility] Procedure:      VAS [place] STEAL EXAM Referring Phys: Jamelle Haring --------------------------------------------------------------------------------  Reason for Exam: Ulcer. Access Site: Left Upper Extremity. Access Type: Brachial-cephalic AVF. Limitations: Ulcers on multiple left digits Comparison Study: No prior study Performing Technologist: Maudry Mayhew MHA, RVT, RDCS, RDMS  Examination Guidelines: A complete evaluation includes B-mode imaging, spectral Doppler, color Doppler, and power Doppler as needed of all accessible portions of each vessel. Bilateral testing is considered an integral part of a complete examination. Limited examinations for reoccurring indications may be performed as noted.  Findings: +-----------------+-------------+----------+---------+----------+--------------+ Upper Arm        Diameter (cm)Depth (cm)BranchingPSV (cm/s) Flow Volume   Arteriovenous                                                 (ml/min)    Fistula                                                                   +-----------------+-------------+----------+---------+----------+--------------+ Distal Brachial                                     181         259       Artery                                                                    +-----------------+-------------+----------+---------+----------+--------------+ AVF  anastomosis                                     162                   +-----------------+-------------+----------+---------+----------+--------------+ outflow vein          0.8                           240                   distal upper arm                                                          +-----------------+-------------+----------+---------+----------+--------------+  outflow vein mid      0.7               branching   116                   upper arm                                                                 +-----------------+-------------+----------+---------+----------+--------------+  Competing branches noted in the upper arm arterio venous fistula. Some data imported from Duplex Dialysis Access 12/29/2020  Summary: Palmar arch signal increases with AVF compression, going from 57cm/s to 121cm/s.  Physiologic study: PPG signal increases in both third and fourth left digit with AVF compression. This is suggestive of steal syndrome. *See table(s) above for measurements and observations.    Preliminary     Scheduled Meds:  allopurinol  100 mg Oral Daily   aspirin EC  81 mg Oral Daily   atorvastatin  40 mg Oral QHS   cephALEXin  500 mg Oral Q12H   Chlorhexidine Gluconate Cloth  6 each Topical Q0600   cholecalciferol  2,000 Units Oral Daily   feeding supplement (NEPRO CARB STEADY)  237 mL Oral BID BM   folic acid  1 mg Oral Daily   heparin  5,000 Units Subcutaneous Q8H   insulin aspart  0-6 Units Subcutaneous Q4H   insulin glargine-yfgn  20 Units Subcutaneous Daily   midodrine  5 mg Oral Once per day on Mon Wed Fri   multivitamin  1 tablet Oral QHS   sodium chloride flush  3 mL Intravenous Q12H   sucroferric oxyhydroxide  1,000 mg Oral TID WC   cyanocobalamin  1,000 mcg Oral Daily   Continuous Infusions:  sodium chloride     vancomycin Stopped (12/28/20 2015)     LOS: 5 days   Time spent: 23 minutes   Darliss Cheney, MD [facility]  12/29/2020, 11:38 AM   How to contact the [facility] Attending or Consulting provider [place] or covering provider during after hours [place], for this patient?  Check the care team in [facility] and look for a) attending/consulting [facility] provider listed and b) the [facility] team listed. Page or secure chat [phone]. Log into [email or web address] and use [place]'s universal password to access. If you do not have the password, please contact the hospital operator. Locate the [facility] provider you are looking for under [facility] and page to a number that you can be directly reached. If you still have difficulty reaching the provider, please page the [facility] (Director on Call) for the Hospitalists listed on amion for assistance.

## 2020-12-29 NOTE — Progress Notes (Signed)
Pt.'s temp.=103.2;MD on call made aware & ordered just to give Tylenol as ordered since pt.was already on Vancomycin & Keflex.

## 2020-12-29 NOTE — Progress Notes (Signed)
Inpatient Diabetes Program Recommendations  AACE/ADA: New Consensus Statement on Inpatient Glycemic Control (2015)  Target Ranges:  Prepandial:   less than 140 mg/dL      Peak postprandial:   less than 180 mg/dL (1-2 hours)      Critically ill patients:  140 - 180 mg/dL   Lab Results  Component Value Date   GLUCAP 143 (H) 12/29/2020   HGBA1C 7.2 (H) 12/24/2020    Review of Glycemic Control Results for ASHETON, LODERMEIER (MRN [id number]) as of 12/29/2020 09:47  Ref. Range 12/28/2020 07:36 12/28/2020 11:51 12/28/2020 15:55 12/28/2020 21:07 12/29/2020 00:35 12/29/2020 04:07 12/29/2020 08:44  Glucose-Capillary Latest Ref Range: 70 - 99 mg/dL 169 (H) 232 (H) 169 (H) 190 (H) 246 (H) 207 (H) 143 (H)   Diabetes history: DM 2 Outpatient Diabetes medications: Trulicity [id number] mg QThursday, Glipizide 10 mg bid Current orders for Inpatient glycemic control:  Semglee 20 units Novolog 0-6 units Q4  A1c 7.2% on 9/1  Inpatient Diabetes Program Recommendations:    Based on glucose trends pt needs more meal coverage than long acting insulin.  - reduce semglee back down to 10 units - Add Novolog 2 units tid meal coverage if eating >50% of meals  Thanks,  Tama Headings RN, MSN, BC-ADM Inpatient Diabetes Coordinator Team Pager [phone] (8a-5p)

## 2020-12-29 NOTE — Progress Notes (Signed)
[facility] [place] PROGRESS NOTE  ASSESSMENT / PLAN: Kevin Meyer is a 50 y.o. 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 Angiogram shows brachial artery is appropriate target. Saphenous vein appears adequate for bypass. Will check finger PPG and flow volume through the fistula today in vascular lab. Plan OR tomorrow for brachial-brachial bypass with interposition ligation (DRIL).   SUBJECTIVE: No complaints. Reviewed findings so far.   OBJECTIVE: BP (!) 107/54 (BP Location: Right Arm)   Pulse 90   Temp 98.4 F (36.9 C) (Oral)   Resp 19   Ht '5\' 6"'$  (1.676 m)   Wt 101 kg   SpO2 95%   BMI 35.94 kg/m   Intake/Output Summary (Last 24 hours) at 12/29/2020 [id number] Last data filed at 12/28/2020 2148 Gross per 24 hour  Intake 774.67 ml  Output 700 ml  Net 74.67 ml    No distress Good thrill in fistula No radial pulse with or without compression of fistula Unchanged appearance of digital ulceration  CBC Latest Ref Rng & Units 12/29/2020 12/28/2020 12/27/2020  WBC 4.0 - 10.5 K/uL 10.6(H) 8.7 11.5(H)  Hemoglobin 13.0 - 17.0 g/dL 10.0(L) 9.6(L) 10.7(L)  Hematocrit 39.0 - 52.0 % 30.7(L) 29.2(L) 32.5(L)  Platelets 150 - 400 K/uL 180 147(L) 133(L)     CMP Latest Ref Rng & Units 12/29/2020 12/28/2020 12/27/2020  Glucose 70 - 99 mg/dL 241(H) 149(H) 156(H)  BUN 6 - 20 mg/dL 38(H) 66(H) 39(H)  Creatinine 0.61 - 1.24 mg/dL 5.56(H) 7.63(H) 6.13(H)  Sodium 135 - 145 mmol/L 133(L) 133(L) 132(L)  Potassium 3.5 - 5.1 mmol/L 4.7 3.7 3.7  Chloride 98 - 111 mmol/L 96(L) 94(L) 93(L)  CO2 22 - 32 mmol/L '29 28 27  '$ Calcium 8.9 - 10.3 mg/dL 8.8(L) 8.7(L) 8.6(L)  Total Protein 6.5 - 8.1 g/dL 6.5 6.2(L) 6.5  Total Bilirubin 0.3 - 1.2 mg/dL 0.6 0.3 0.6  Alkaline Phos 38 - 126 U/L 72 54 63  AST 15 - 41 U/L 111(H) 90(H) 84(H)  ALT 0 - 44 U/L 120(H) 94(H) 77(H)    Estimated Creatinine Clearance: 17.7 mL/min (A) (by C-G formula based on SCr of 5.56 mg/dL  (H)).  Yevonne Aline. Stanford Breed, MD [facility] Phone Number: [phone] 12/29/2020 8:22 AM

## 2020-12-29 NOTE — Progress Notes (Signed)
Left AVF duplex and steal exam completed. Refer to "CV Proc" under chart review to view preliminary results.  12/29/2020 10:10 AM Kelby Aline., MHA, RVT, RDCS, RDMS

## 2020-12-29 NOTE — Progress Notes (Addendum)
[facility] Progress Note   Subjective:     Patient seen and examined at bedside. Patient sitting up in chair in his room. No complaints at this time. He reports feeling fine during yesterday's HD (pre-medicated with Zofran), but vomited as soon as he returned back to his room. Vomiting was accompanied with tremors and fever. He says symptoms lasted for few hours then resolved. Also, only pulled off 657m yesterday d/t episodes of hypotension. Patient on Midodrine. Currently afebrile and remains on RA. Plan for brachial-brachial bypass with interposition ligation via Dr. HStanford Breed  Objective Vitals:   12/29/20 0409 12/29/20 0845 12/29/20 0901 12/29/20 1437  BP: (!) 107/54 (!) 88/46 (!) 89/54 114/69  Pulse: 90 86 87 83  Resp: '19 18 18 18  '$ Temp: 98.4 F (36.9 C) 98.6 F (37 C)  98.3 F (36.8 C)  TempSrc: Oral Oral  Oral  SpO2: 95% 98%  95%  Weight:      Height:       Physical Exam General: Well-appearing male; NAD; on RA Heart: S1 and S2; No murmurs, gallops, or rubs Lungs: Clear throughout, no wheezing, rales, or rhonchi Abdomen: Soft and non-tender Extremities: No edema BLLE Dialysis Access: R TDC; L AVF (+) bruit/thrill; ischemic ulcers L 2nd/5th fingers   Filed Weights   12/26/20 1123 12/28/20 1715 12/28/20 2015  Weight: 97.5 kg 101.7 kg 101 kg    Intake/Output Summary (Last 24 hours) at 12/29/2020 1530 Last data filed at 12/29/2020 1352 Gross per 24 hour  Intake 774.67 ml  Output 600 ml  Net 174.67 ml    Additional Objective Labs: Basic Metabolic Panel: Recent Labs  Lab 12/27/20 0149 12/28/20 0116 12/29/20 0248  NA 132* 133* 133*  K 3.7 3.7 4.7  CL 93* 94* 96*  CO2 '27 28 29  '$ GLUCOSE 156* 149* 241*  BUN 39* 66* 38*  CREATININE 6.13* 7.63* 5.56*  CALCIUM 8.6* 8.7* 8.8*  PHOS 4.1 5.1* 2.2*   Liver Function Tests: Recent Labs  Lab 12/27/20 0149 12/28/20 0116 12/29/20 0248  AST 84* 90* 111*  ALT 77* 94* 120*  ALKPHOS 63 54 72  BILITOT 0.6  0.3 0.6  PROT 6.5 6.2* 6.5  ALBUMIN 3.1* 2.9* 3.0*   No results for input(s): LIPASE, AMYLASE in the last 168 hours. CBC: Recent Labs  Lab 12/25/20 1544 12/26/20 0717 12/27/20 0149 12/28/20 0116 12/29/20 0248  WBC 5.8 5.3 11.5* 8.7 10.6*  NEUTROABS  --  3.1 8.2* 6.8 8.9*  HGB 11.8* 10.2* 10.7* 9.6* 10.0*  HCT 35.9* 31.8* 32.5* 29.2* 30.7*  MCV 91.1 91.9 91.5 91.5 91.9  PLT 124* 132* 133* 147* 180   Blood Culture    Component Value Date/Time   SDES BLOOD RIGHT ANTECUBITAL 12/26/2020 1526   SPECREQUEST  12/26/2020 1526    BOTTLES DRAWN AEROBIC ONLY Blood Culture adequate volume   CULT  12/26/2020 1526    NO GROWTH 3 DAYS Performed at [facility] [id number] [place] [id number]   REPTSTATUS PENDING 12/26/2020 1526    Cardiac Enzymes: No results for input(s): CKTOTAL, CKMB, CKMBINDEX, TROPONINI in the last 168 hours. CBG: Recent Labs  Lab 12/28/20 2107 12/29/20 0035 12/29/20 0407 12/29/20 0844 12/29/20 1203  GLUCAP 190* 246* 207* 143* 235*   Iron Studies: No results for input(s): IRON, TIBC, TRANSFERRIN, FERRITIN in the last 72 hours. Lab Results  Component Value Date   INR 1.0 12/24/2020   INR 1.3 (H) 06/23/2020   Studies/Results: VAS [place]  DUPLEX DIALYSIS ACCESS (AVF, AVG)  Result Date: 12/29/2020 DIALYSIS ACCESS Patient Name:  UGO BOURN  Date of Exam:   12/29/2020 Medical Rec #: [id number]    Accession #:    [id number] Date of Birth: 04/04/1971     Patient Gender: M Patient Age:   50 years Exam Location:  [facility] Procedure:      VAS US DUPLEX DIALYSIS ACCESS (AVF, AVG) Referring Phys: Jamelle Haring --------------------------------------------------------------------------------  Reason for Exam: Left digit ulcers. Access Site: Left Upper Extremity. Access Type: Brachial-cephalic AVF. Comparison Study: No prior study Performing Technologist: Maudry Mayhew MHA, RDMS, RVT, RDCS  Examination Guidelines: A complete evaluation includes  B-mode imaging, spectral Doppler, color Doppler, and power Doppler as needed of all accessible portions of each vessel. Unilateral testing is considered an integral part of a complete examination. Limited examinations for reoccurring indications may be performed as noted.  Findings: +--------------------+----------+-----------------+--------+ AVF                 PSV (cm/s)Flow Vol (mL/min)Comments +--------------------+----------+-----------------+--------+ Native artery inflow   181           259                +--------------------+----------+-----------------+--------+ AVF Anastomosis        162                              +--------------------+----------+-----------------+--------+  +------------+---------+------------+----------+-------------------------------+ OUTFLOW VEIN   PSV     Diameter  Depth (cm)           Describe                          (cm/s)      (cm)                                              +------------+---------+------------+----------+-------------------------------+ Prox UA        404       0.47                                              +------------+---------+------------+----------+-------------------------------+ Mid UA         116       0.69                competing branch measuring                                                           0.37cm              +------------+---------+------------+----------+-------------------------------+ Dist UA        240       0.81                                              +------------+---------+------------+----------+-------------------------------+ Left palmar arch= 57cm/s without AVF compression, 121cm/s with AVF  compression   Summary: Patent left brachiocephalic AVF. There is evidence of a competing branch. PW Doppler of left palmar arch with and without compression reveals evidence of steal syndrome.  *See table(s) above for measurements and observations.       --------------------------------------------------------------------------------   Preliminary    VAS [place] STEAL EXAM  Result Date: 12/29/2020 DIALYSIS STEAL  Patient Name:  ZAYVIN EBARB  Date of Exam:   12/29/2020 Medical Rec #: [id number]    Accession #:    [id number] Date of Birth: 08-13-1970     Patient Gender: M Patient Age:   22 years Exam Location:  [facility] Procedure:      VAS [place] STEAL EXAM Referring Phys: Jamelle Haring --------------------------------------------------------------------------------  Reason for Exam: Ulcer. Access Site: Left Upper Extremity. Access Type: Brachial-cephalic AVF. Limitations: Ulcers on multiple left digits Comparison Study: No prior study Performing Technologist: Maudry Mayhew MHA, RVT, RDCS, RDMS  Examination Guidelines: A complete evaluation includes B-mode imaging, spectral Doppler, color Doppler, and power Doppler as needed of all accessible portions of each vessel. Bilateral testing is considered an integral part of a complete examination. Limited examinations for reoccurring indications may be performed as noted.  Findings: +-----------------+-------------+----------+---------+----------+--------------+ Upper Arm        Diameter (cm)Depth (cm)BranchingPSV (cm/s) Flow Volume   Arteriovenous                                                 (ml/min)    Fistula                                                                   +-----------------+-------------+----------+---------+----------+--------------+ Distal Brachial                                     181         259       Artery                                                                    +-----------------+-------------+----------+---------+----------+--------------+ AVF anastomosis                                     162                   +-----------------+-------------+----------+---------+----------+--------------+ outflow vein          0.8                            240                   distal upper arm                                                          +-----------------+-------------+----------+---------+----------+--------------+  outflow vein mid      0.7               branching   116                   upper arm                                                                 +-----------------+-------------+----------+---------+----------+--------------+  Competing branches noted in the upper arm arterio venous fistula. Some data imported from Duplex Dialysis Access 12/29/2020  Summary: Palmar arch signal increases with AVF compression, going from 57cm/s to 121cm/s.  Physiologic study: PPG signal increases in both third and fourth left digit with AVF compression. This is suggestive of steal syndrome. *See table(s) above for measurements and observations.    Preliminary     Medications:  sodium chloride     vancomycin Stopped (12/28/20 2015)    allopurinol  100 mg Oral Daily   aspirin EC  81 mg Oral Daily   atorvastatin  40 mg Oral QHS   cephALEXin  500 mg Oral Q12H   Chlorhexidine Gluconate Cloth  6 each Topical Q0600   cholecalciferol  2,000 Units Oral Daily   feeding supplement (NEPRO CARB STEADY)  237 mL Oral BID BM   folic acid  1 mg Oral Daily   heparin  5,000 Units Subcutaneous Q8H   insulin aspart  0-6 Units Subcutaneous Q4H   insulin glargine-yfgn  20 Units Subcutaneous Daily   midodrine  5 mg Oral Once per day on Mon Wed Fri   multivitamin  1 tablet Oral QHS   sodium chloride flush  3 mL Intravenous Q12H   sucroferric oxyhydroxide  1,000 mg Oral TID WC   cyanocobalamin  1,000 mcg Oral Daily    Dialysis Orders: MWF at [place] 4hr, 400/600, EDW 99.5kg, 2K/2.5Ca, AVF + TDC, heparin 5000 - Calcitriol 0.80mg PO q HD - No ESA  Assessment/Plan: 1. Chills/rigors/vomiting: Symptoms occur near end of tx then would resolve after HD. This was occurring at outpatient as well.  Blood Cx 9/1, 9/4 negative.  CXR clear. Unclear etiology. Patient has been on HD for 6-7 months. Plan for HD 9/7: Continue UFP #2 and ensure patient is pre-medicated with zofran and midodrine pre - HD. Also, consider abdominal imaging and cardiac testing (stress test, ?could this be angina equiv).  2. 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 On IV Vanc + PO Keflex. 3. ESRD: Back to usual MWF schedule - HD on 9/7. K+ level now 4.7-will change to 2K bath. 4. HTN/volume: BP low/stable, follow. 5. Anemia: Hgb 9.6, no ESA for now. 6. Secondary hyperparathyroidism: Ca ok, Phos high - continue Velphoro, dose recently raised. 7. Nutrition: Alb low, continue protein supplements. 8. T2DM  Addendum 12/29/20 at 3:30pm-I spoke with Dr. PDoristine Bosworthto discuss patient's symptoms near ending his HD treatments (vomiting, chills, rigors). So far, sepsis w/u has been (-) and patient denies any abdominal symptoms. Still unclear on etiology. This may not be related to his hemodialysis. Will continue to watch his symptoms closely. If symptoms persist, next step will be to consult IR for further recommendations.  CTobie Poet NP [facility]  12/29/2020,3:30 PM  LOS: 5 days

## 2020-12-30 ENCOUNTER — Ambulatory Visit: Payer: Medicaid Other | Admitting: Vascular Surgery

## 2020-12-30 ENCOUNTER — Other Ambulatory Visit: Payer: Medicaid Other

## 2020-12-30 ENCOUNTER — Encounter (HOSPITAL_COMMUNITY)
Admission: EM | Disposition: A | Discharge status: Home / Self Care | Payer: Self-pay | Source: Home / Self Care | Attending: Family Medicine

## 2020-12-30 ENCOUNTER — Encounter (HOSPITAL_COMMUNITY): Payer: Self-pay | Admitting: Internal Medicine

## 2020-12-30 ENCOUNTER — Inpatient Hospital Stay (HOSPITAL_COMMUNITY): Payer: Medicaid Other | Admitting: Anesthesiology

## 2020-12-30 DIAGNOSIS — T82898A Other specified complication of vascular prosthetic devices, implants and grafts, initial encounter: Secondary | ICD-10-CM

## 2020-12-30 DIAGNOSIS — E669 Obesity, unspecified: Secondary | ICD-10-CM | POA: Diagnosis not present

## 2020-12-30 DIAGNOSIS — L089 Local infection of the skin and subcutaneous tissue, unspecified: Secondary | ICD-10-CM | POA: Diagnosis not present

## 2020-12-30 DIAGNOSIS — L98499 Non-pressure chronic ulcer of skin of other sites with unspecified severity: Secondary | ICD-10-CM

## 2020-12-30 DIAGNOSIS — I10 Essential (primary) hypertension: Secondary | ICD-10-CM

## 2020-12-30 DIAGNOSIS — N186 End stage renal disease: Secondary | ICD-10-CM

## 2020-12-30 HISTORY — PX: DISTAL REVASCULARIZATION AND INTERVAL LIGATION (DRIL): SHX6669

## 2020-12-30 HISTORY — PX: VEIN HARVEST: SHX6363

## 2020-12-30 LAB — CBC WITH DIFFERENTIAL/PLATELET
Abs Immature Granulocytes: 0 10*3/uL (ref 0.00–0.07)
Basophils Absolute: 0.1 10*3/uL (ref 0.0–0.1)
Basophils Relative: 2 %
Eosinophils Absolute: 0.1 10*3/uL (ref 0.0–0.5)
Eosinophils Relative: 1 %
HCT: 29.1 % — ABNORMAL LOW (ref 39.0–52.0)
Hemoglobin: 9.6 g/dL — ABNORMAL LOW (ref 13.0–17.0)
Lymphocytes Relative: 20 %
Lymphs Abs: 1.5 10*3/uL (ref 0.7–4.0)
MCH: 30.3 pg (ref 26.0–34.0)
MCHC: 33 g/dL (ref 30.0–36.0)
MCV: 91.8 fL (ref 80.0–100.0)
Monocytes Absolute: 0.2 10*3/uL (ref 0.1–1.0)
Monocytes Relative: 3 %
Neutro Abs: 5.4 10*3/uL (ref 1.7–7.7)
Neutrophils Relative %: 74 %
Platelets: 183 10*3/uL (ref 150–400)
RBC: 3.17 MIL/uL — ABNORMAL LOW (ref 4.22–5.81)
RDW: 15.3 % (ref 11.5–15.5)
WBC: 7.3 10*3/uL (ref 4.0–10.5)
nRBC: 0 % (ref 0.0–0.2)
nRBC: 0 /100 WBC

## 2020-12-30 LAB — GLUCOSE, CAPILLARY
Glucose-Capillary: 167 mg/dL — ABNORMAL HIGH (ref 70–99)
Glucose-Capillary: 213 mg/dL — ABNORMAL HIGH (ref 70–99)
Glucose-Capillary: 215 mg/dL — ABNORMAL HIGH (ref 70–99)
Glucose-Capillary: 219 mg/dL — ABNORMAL HIGH (ref 70–99)
Glucose-Capillary: 261 mg/dL — ABNORMAL HIGH (ref 70–99)

## 2020-12-30 LAB — RENAL FUNCTION PANEL
Albumin: 3 g/dL — ABNORMAL LOW (ref 3.5–5.0)
Anion gap: 11 (ref 5–15)
BUN: 65 mg/dL — ABNORMAL HIGH (ref 6–20)
CO2: 24 mmol/L (ref 22–32)
Calcium: 8.8 mg/dL — ABNORMAL LOW (ref 8.9–10.3)
Chloride: 95 mmol/L — ABNORMAL LOW (ref 98–111)
Creatinine, Ser: 7.26 mg/dL — ABNORMAL HIGH (ref 0.61–1.24)
GFR, Estimated: 8 mL/min — ABNORMAL LOW (ref >60)
Glucose, Bld: 229 mg/dL — ABNORMAL HIGH (ref 70–99)
Phosphorus: 3.2 mg/dL (ref 2.5–4.6)
Potassium: 4.6 mmol/L (ref 3.5–5.1)
Sodium: 130 mmol/L — ABNORMAL LOW (ref 135–145)

## 2020-12-30 LAB — HEPATITIS PANEL, ACUTE
HCV Ab: NONREACTIVE
Hep A IgM: NONREACTIVE
Hep B C IgM: NONREACTIVE
Hepatitis B Surface Ag: NONREACTIVE

## 2020-12-30 LAB — MAGNESIUM: Magnesium: 2.3 mg/dL (ref 1.7–2.4)

## 2020-12-30 LAB — PHOSPHORUS: Phosphorus: 3 mg/dL (ref 2.5–4.6)

## 2020-12-30 SURGERY — DISTAL REVASCULARIZATION AND INTERVAL LIGATION PROCEDURE
Anesthesia: General | Laterality: Left

## 2020-12-30 MED ORDER — GLYCOPYRROLATE PF 0.2 MG/ML IJ SOSY
PREFILLED_SYRINGE | INTRAMUSCULAR | Status: AC
  Filled 2020-12-30: qty 2

## 2020-12-30 MED ORDER — HEPATITIS B VAC RECOMBINANT 10 MCG/0.5ML IJ SUSP: 2.0000 mL | Freq: Once | INTRAMUSCULAR | Status: DC

## 2020-12-30 MED ORDER — SODIUM CHLORIDE 0.9 % IV SOLN: INTRAVENOUS | Status: DC | PRN

## 2020-12-30 MED ORDER — PROTAMINE SULFATE 10 MG/ML IV SOLN
INTRAVENOUS | Status: DC | PRN
  Administered 2020-12-30: 50 mg via INTRAVENOUS

## 2020-12-30 MED ORDER — OXYCODONE-ACETAMINOPHEN 5-325 MG PO TABS: 1.0000 | ORAL_TABLET | ORAL | Status: DC | PRN

## 2020-12-30 MED ORDER — FENTANYL CITRATE (PF) 250 MCG/5ML IJ SOLN
INTRAMUSCULAR | Status: DC | PRN
  Administered 2020-12-30 (×3): 50 ug via INTRAVENOUS

## 2020-12-30 MED ORDER — HEPARIN SODIUM (PORCINE) 1000 UNIT/ML IJ SOLN
INTRAMUSCULAR | Status: DC | PRN
  Administered 2020-12-30: 2000 [IU] via INTRAVENOUS
  Administered 2020-12-30: 10000 [IU] via INTRAVENOUS

## 2020-12-30 MED ORDER — ROCURONIUM BROMIDE 10 MG/ML (PF) SYRINGE
PREFILLED_SYRINGE | INTRAVENOUS | Status: DC | PRN
  Administered 2020-12-30: 70 mg via INTRAVENOUS

## 2020-12-30 MED ORDER — OXYCODONE HCL 5 MG PO TABS
5.0000 mg | ORAL_TABLET | Freq: Once | ORAL | Status: AC
  Administered 2020-12-30: 5 mg via ORAL

## 2020-12-30 MED ORDER — HEPARIN SODIUM (PORCINE) 1000 UNIT/ML IJ SOLN
INTRAMUSCULAR | Status: AC
  Filled 2020-12-30: qty 1

## 2020-12-30 MED ORDER — PROMETHAZINE HCL 25 MG/ML IJ SOLN: 6.2500 mg | INTRAMUSCULAR | Status: DC | PRN

## 2020-12-30 MED ORDER — HEPARIN 6000 UNIT IRRIGATION SOLUTION
Status: AC
  Filled 2020-12-30: qty 500

## 2020-12-30 MED ORDER — PHENYLEPHRINE 40 MCG/ML (10ML) SYRINGE FOR IV PUSH (FOR BLOOD PRESSURE SUPPORT)
PREFILLED_SYRINGE | INTRAVENOUS | Status: DC | PRN
  Administered 2020-12-30: 80 ug via INTRAVENOUS
  Administered 2020-12-30: 160 ug via INTRAVENOUS

## 2020-12-30 MED ORDER — OXYCODONE HCL 5 MG PO TABS: 5.0000 mg | ORAL_TABLET | Freq: Once | ORAL | Status: DC | PRN

## 2020-12-30 MED ORDER — LIDOCAINE 2% (20 MG/ML) 5 ML SYRINGE
INTRAMUSCULAR | Status: DC | PRN
  Administered 2020-12-30: 100 mg via INTRAVENOUS

## 2020-12-30 MED ORDER — PHENYLEPHRINE 40 MCG/ML (10ML) SYRINGE FOR IV PUSH (FOR BLOOD PRESSURE SUPPORT)
PREFILLED_SYRINGE | INTRAVENOUS | Status: AC
  Filled 2020-12-30: qty 10

## 2020-12-30 MED ORDER — 0.9 % SODIUM CHLORIDE (POUR BTL) OPTIME
TOPICAL | Status: DC | PRN
  Administered 2020-12-30: 1000 mL

## 2020-12-30 MED ORDER — ONDANSETRON HCL 4 MG/2ML IJ SOLN
INTRAMUSCULAR | Status: DC | PRN
  Administered 2020-12-30: 4 mg via INTRAVENOUS

## 2020-12-30 MED ORDER — HYDROMORPHONE HCL 1 MG/ML IJ SOLN: 0.2500 mg | INTRAMUSCULAR | Status: DC | PRN

## 2020-12-30 MED ORDER — NEOSTIGMINE METHYLSULFATE 3 MG/3ML IV SOSY
PREFILLED_SYRINGE | INTRAVENOUS | Status: AC
  Filled 2020-12-30: qty 3

## 2020-12-30 MED ORDER — ONDANSETRON HCL 4 MG/2ML IJ SOLN
INTRAMUSCULAR | Status: AC
  Filled 2020-12-30: qty 2

## 2020-12-30 MED ORDER — MIDAZOLAM HCL 5 MG/5ML IJ SOLN
INTRAMUSCULAR | Status: DC | PRN
  Administered 2020-12-30: 2 mg via INTRAVENOUS

## 2020-12-30 MED ORDER — HYDROMORPHONE HCL 1 MG/ML IJ SOLN: 0.5000 mg | INTRAMUSCULAR | Status: DC | PRN

## 2020-12-30 MED ORDER — CEFAZOLIN SODIUM-DEXTROSE 2-3 GM-%(50ML) IV SOLR
INTRAVENOUS | Status: DC | PRN
  Administered 2020-12-30 (×2): 2 g via INTRAVENOUS

## 2020-12-30 MED ORDER — PROPOFOL 10 MG/ML IV BOLUS
INTRAVENOUS | Status: AC
  Filled 2020-12-30: qty 20

## 2020-12-30 MED ORDER — DEXAMETHASONE SODIUM PHOSPHATE 10 MG/ML IJ SOLN
INTRAMUSCULAR | Status: DC | PRN
  Administered 2020-12-30: 4 mg via INTRAVENOUS

## 2020-12-30 MED ORDER — MIDAZOLAM HCL 2 MG/2ML IJ SOLN
INTRAMUSCULAR | Status: AC
  Filled 2020-12-30: qty 2

## 2020-12-30 MED ORDER — HEPATITIS B VAC RECOMB ADJ 20 MCG/0.5ML IM SOSY
0.5000 mL | PREFILLED_SYRINGE | Freq: Once | INTRAMUSCULAR | Status: AC
  Administered 2020-12-30: 0.5 mL via INTRAMUSCULAR
  Filled 2020-12-30: qty 0.5

## 2020-12-30 MED ORDER — NEOSTIGMINE METHYLSULFATE 10 MG/10ML IV SOLN
INTRAVENOUS | Status: DC | PRN
  Administered 2020-12-30: 3 mg via INTRAVENOUS

## 2020-12-30 MED ORDER — PROTAMINE SULFATE 10 MG/ML IV SOLN
INTRAVENOUS | Status: AC
  Filled 2020-12-30: qty 5

## 2020-12-30 MED ORDER — HEPARIN 6000 UNIT IRRIGATION SOLUTION
Status: DC | PRN
  Administered 2020-12-30: 1

## 2020-12-30 MED ORDER — CEFAZOLIN SODIUM-DEXTROSE 2-4 GM/100ML-% IV SOLN
INTRAVENOUS | Status: AC
  Filled 2020-12-30: qty 100

## 2020-12-30 MED ORDER — LIDOCAINE-EPINEPHRINE (PF) 1 %-1:200000 IJ SOLN
INTRAMUSCULAR | Status: AC
  Filled 2020-12-30: qty 30

## 2020-12-30 MED ORDER — LIDOCAINE 2% (20 MG/ML) 5 ML SYRINGE
INTRAMUSCULAR | Status: AC
  Filled 2020-12-30: qty 5

## 2020-12-30 MED ORDER — PHENYLEPHRINE HCL-NACL 20-0.9 MG/250ML-% IV SOLN
INTRAVENOUS | Status: DC | PRN
  Administered 2020-12-30: 25 ug/min via INTRAVENOUS

## 2020-12-30 MED ORDER — OXYCODONE HCL 5 MG PO TABS
ORAL_TABLET | ORAL | Status: AC
  Filled 2020-12-30: qty 1

## 2020-12-30 MED ORDER — OXYCODONE HCL 5 MG/5ML PO SOLN
5.0000 mg | Freq: Once | ORAL | Status: DC | PRN
Start: 2020-12-30 — End: 2020-12-30

## 2020-12-30 MED ORDER — FENTANYL CITRATE (PF) 250 MCG/5ML IJ SOLN
INTRAMUSCULAR | Status: AC
  Filled 2020-12-30: qty 5

## 2020-12-30 MED ORDER — ROCURONIUM BROMIDE 10 MG/ML (PF) SYRINGE
PREFILLED_SYRINGE | INTRAVENOUS | Status: AC
  Filled 2020-12-30: qty 10

## 2020-12-30 MED ORDER — PROPOFOL 10 MG/ML IV BOLUS
INTRAVENOUS | Status: DC | PRN
  Administered 2020-12-30: 20 mg via INTRAVENOUS
  Administered 2020-12-30: 150 mg via INTRAVENOUS
  Administered 2020-12-30: 30 mg via INTRAVENOUS

## 2020-12-30 MED ORDER — GLYCOPYRROLATE 0.2 MG/ML IJ SOLN
INTRAMUSCULAR | Status: DC | PRN
  Administered 2020-12-30: .4 mg via INTRAVENOUS

## 2020-12-30 MED ORDER — DEXAMETHASONE SODIUM PHOSPHATE 10 MG/ML IJ SOLN
INTRAMUSCULAR | Status: AC
  Filled 2020-12-30: qty 1

## 2020-12-30 SURGICAL SUPPLY — 63 items
BAG COUNTER SPONGE SURGICOUNT (BAG) ×2 IMPLANT
BAG DECANTER FOR FLEXI CONT (MISCELLANEOUS) IMPLANT
BENZOIN TINCTURE PRP APPL 2/3 (GAUZE/BANDAGES/DRESSINGS) ×2 IMPLANT
BIOPATCH RED 1 DISK 7.0 (GAUZE/BANDAGES/DRESSINGS) IMPLANT
CANNULA VESSEL 3MM 2 BLNT TIP (CANNULA) ×2 IMPLANT
CATH PALINDROME-P 19CM W/VT (CATHETERS) IMPLANT
CATH PALINDROME-P 23CM W/VT (CATHETERS) IMPLANT
CATH PALINDROME-P 28CM W/VT (CATHETERS) IMPLANT
CLIP TI MEDIUM 24 (CLIP) ×2 IMPLANT
CLIP TI WIDE RED SMALL 24 (CLIP) ×2 IMPLANT
CLSR STERI-STRIP ANTIMIC 1/2X4 (GAUZE/BANDAGES/DRESSINGS) ×2 IMPLANT
COVER PROBE W GEL 5X96 (DRAPES) ×2 IMPLANT
COVER SURGICAL LIGHT HANDLE (MISCELLANEOUS) ×2 IMPLANT
DERMABOND ADVANCED (GAUZE/BANDAGES/DRESSINGS) ×3
DERMABOND ADVANCED .7 DNX12 (GAUZE/BANDAGES/DRESSINGS) ×3 IMPLANT
DRAPE C-ARM 42X72 X-RAY (DRAPES) IMPLANT
DRAPE CHEST BREAST 15X10 FENES (DRAPES) IMPLANT
DRSG TEGADERM 4X4.75 (GAUZE/BANDAGES/DRESSINGS) ×2 IMPLANT
GAUZE 4X4 16PLY ~~LOC~~+RFID DBL (SPONGE) ×2 IMPLANT
GAUZE SPONGE 4X4 12PLY STRL (GAUZE/BANDAGES/DRESSINGS) ×2 IMPLANT
GAUZE SPONGE 4X4 12PLY STRL LF (GAUZE/BANDAGES/DRESSINGS) ×2 IMPLANT
GLOVE SRG 8 PF TXTR STRL LF DI (GLOVE) ×1 IMPLANT
GLOVE SURG POLYISO LF SZ6.5 (GLOVE) ×2 IMPLANT
GLOVE SURG POLYISO LF SZ8 (GLOVE) ×4 IMPLANT
GLOVE SURG UNDER POLY LF SZ6.5 (GLOVE) ×6 IMPLANT
GLOVE SURG UNDER POLY LF SZ7 (GLOVE) ×4 IMPLANT
GLOVE SURG UNDER POLY LF SZ8 (GLOVE) ×1
GOWN STRL REUS W/ TWL LRG LVL3 (GOWN DISPOSABLE) ×4 IMPLANT
GOWN STRL REUS W/ TWL XL LVL3 (GOWN DISPOSABLE) ×2 IMPLANT
GOWN STRL REUS W/TWL LRG LVL3 (GOWN DISPOSABLE) ×4
GOWN STRL REUS W/TWL XL LVL3 (GOWN DISPOSABLE) ×2
KIT BASIN OR (CUSTOM PROCEDURE TRAY) ×2 IMPLANT
KIT PALINDROME-P 55CM (CATHETERS) IMPLANT
KIT TURNOVER KIT B (KITS) ×2 IMPLANT
NEEDLE 18GX1X1/2 (RX/OR ONLY) (NEEDLE) IMPLANT
NEEDLE HYPO 25GX1X1/2 BEV (NEEDLE) ×2 IMPLANT
NS IRRIG 1000ML POUR BTL (IV SOLUTION) ×2 IMPLANT
PACK CV ACCESS (CUSTOM PROCEDURE TRAY) ×2 IMPLANT
PACK SURGICAL SETUP 50X90 (CUSTOM PROCEDURE TRAY) ×2 IMPLANT
PAD ARMBOARD 7.5X6 YLW CONV (MISCELLANEOUS) ×4 IMPLANT
SET MICROPUNCTURE 5F STIFF (MISCELLANEOUS) IMPLANT
SOAP 2 % CHG 4 OZ (WOUND CARE) IMPLANT
STRIP CLOSURE SKIN 1/2X4 (GAUZE/BANDAGES/DRESSINGS) IMPLANT
SUT ETHILON 3 0 PS 1 (SUTURE) IMPLANT
SUT MNCRL AB 4-0 PS2 18 (SUTURE) ×6 IMPLANT
SUT PROLENE 6 0 BV (SUTURE) ×12 IMPLANT
SUT SILK 0 TIES 10X30 (SUTURE) ×2 IMPLANT
SUT SILK 2 0 SH (SUTURE) ×2 IMPLANT
SUT VIC AB 2-0 CT1 27 (SUTURE) ×1
SUT VIC AB 2-0 CT1 36 (SUTURE) ×2 IMPLANT
SUT VIC AB 2-0 CT1 TAPERPNT 27 (SUTURE) ×1 IMPLANT
SUT VIC AB 3-0 SH 27 (SUTURE) ×3
SUT VIC AB 3-0 SH 27X BRD (SUTURE) ×3 IMPLANT
SYR 10ML LL (SYRINGE) IMPLANT
SYR 20ML LL LF (SYRINGE) IMPLANT
SYR 5ML LL (SYRINGE) IMPLANT
SYR CONTROL 10ML LL (SYRINGE) IMPLANT
TAPE STRIPS DRAPE STRL (GAUZE/BANDAGES/DRESSINGS) ×2 IMPLANT
TAPE VIPERTRACK RADIOPAQ 30X (MISCELLANEOUS) ×1 IMPLANT
TAPE VIPERTRACK RADIOPAQUE (MISCELLANEOUS) ×1
TOWEL GREEN STERILE (TOWEL DISPOSABLE) ×2 IMPLANT
TOWEL GREEN STERILE FF (TOWEL DISPOSABLE) ×4 IMPLANT
WATER STERILE IRR 1000ML POUR (IV SOLUTION) ×2 IMPLANT

## 2020-12-30 NOTE — Progress Notes (Addendum)
[facility] Progress Note   Subjective:    Kevin Meyer was in OR most of the day. S/p brachial-bracial bypass with interposition ligation via Dr. Stanford Breed. Patient currently in PACU.  Objective Vitals:   12/30/20 1500 12/30/20 1515 12/30/20 1530 12/30/20 1611  BP: (!) 142/76 (!) 148/80 (!) 142/75 137/83  Pulse: 84 86 84 97  Resp: '16 17 14 15  '$ Temp:    98.4 F (36.9 C)  TempSrc:    Oral  SpO2: 94% 96% 95% 96%  Weight:      Height:         Filed Weights   12/26/20 1123 12/28/20 1715 12/28/20 2015  Weight: 97.5 kg 101.7 kg 101 kg    Intake/Output Summary (Last 24 hours) at 12/30/2020 1619 Last data filed at 12/30/2020 1220 Gross per 24 hour  Intake 740 ml  Output 160 ml  Net 580 ml    Additional Objective Labs: Basic Metabolic Panel: Recent Labs  Lab 12/27/20 0149 12/28/20 0116 12/29/20 0248 12/30/20 0218  NA 132* 133* 133*  --   K 3.7 3.7 4.7  --   CL 93* 94* 96*  --   CO2 '27 28 29  '$ --   GLUCOSE 156* 149* 241*  --   BUN 39* 66* 38*  --   CREATININE 6.13* 7.63* 5.56*  --   CALCIUM 8.6* 8.7* 8.8*  --   PHOS 4.1 5.1* 2.2* 3.0   Liver Function Tests: Recent Labs  Lab 12/27/20 0149 12/28/20 0116 12/29/20 0248  AST 84* 90* 111*  ALT 77* 94* 120*  ALKPHOS 63 54 72  BILITOT 0.6 0.3 0.6  PROT 6.5 6.2* 6.5  ALBUMIN 3.1* 2.9* 3.0*   No results for input(s): LIPASE, AMYLASE in the last 168 hours. CBC: Recent Labs  Lab 12/26/20 0717 12/27/20 0149 12/28/20 0116 12/29/20 0248 12/30/20 0218  WBC 5.3 11.5* 8.7 10.6* 7.3  NEUTROABS 3.1 8.2* 6.8 8.9* 5.4  HGB 10.2* 10.7* 9.6* 10.0* 9.6*  HCT 31.8* 32.5* 29.2* 30.7* 29.1*  MCV 91.9 91.5 91.5 91.9 91.8  PLT 132* 133* 147* 180 183   Blood Culture    Component Value Date/Time   SDES BLOOD RIGHT ANTECUBITAL 12/26/2020 1526   SPECREQUEST  12/26/2020 1526    BOTTLES DRAWN AEROBIC ONLY Blood Culture adequate volume   CULT  12/26/2020 1526    NO GROWTH 4 DAYS Performed at [facility],  [place] [address] [place], [place] [address]    REPTSTATUS PENDING 12/26/2020 1526    Cardiac Enzymes: No results for input(s): CKTOTAL, CKMB, CKMBINDEX, TROPONINI in the last 168 hours. CBG: Recent Labs  Lab 12/29/20 1655 12/29/20 1950 12/30/20 0041 12/30/20 0419 12/30/20 1328  GLUCAP 122* 256* 213* 167* 219*   Iron Studies: No results for input(s): IRON, TIBC, TRANSFERRIN, FERRITIN in the last 72 hours. Lab Results  Component Value Date   INR 1.0 12/24/2020   INR 1.3 (H) 06/23/2020   Studies/Results: VAS US DUPLEX DIALYSIS ACCESS (AVF, AVG)  Result Date: 12/29/2020 DIALYSIS ACCESS Patient Name:  Kevin Meyer  Date of Exam:   12/29/2020 Medical Rec #: [id number]    Accession #:    [id number] Date of Birth: 06-21-1970     Patient Gender: M Patient Age:   50 years Exam Location:  [facility] Procedure:      VAS US DUPLEX DIALYSIS ACCESS (AVF, AVG) Referring Phys: Jamelle Haring --------------------------------------------------------------------------------  Reason for Exam: Left digit ulcers. Access Site: Left Upper Extremity. Access Type:  Brachial-cephalic AVF. Comparison Study: No prior study Performing Technologist: Maudry Mayhew MHA, RDMS, RVT, RDCS  Examination Guidelines: A complete evaluation includes B-mode imaging, spectral Doppler, color Doppler, and power Doppler as needed of all accessible portions of each vessel. Unilateral testing is considered an integral part of a complete examination. Limited examinations for reoccurring indications may be performed as noted.  Findings: +--------------------+----------+-----------------+--------+ AVF                 PSV (cm/s)Flow Vol (mL/min)Comments +--------------------+----------+-----------------+--------+ Native artery inflow   181           259                +--------------------+----------+-----------------+--------+ AVF Anastomosis        162                               +--------------------+----------+-----------------+--------+  +------------+---------+------------+----------+-------------------------------+ OUTFLOW VEIN   PSV     Diameter  Depth (cm)           Describe                          (cm/s)      (cm)                                              +------------+---------+------------+----------+-------------------------------+ Prox UA        404       0.47                                              +------------+---------+------------+----------+-------------------------------+ Mid UA         116       0.69                competing branch measuring                                                           0.37cm              +------------+---------+------------+----------+-------------------------------+ Dist UA        240       0.81                                              +------------+---------+------------+----------+-------------------------------+ Left palmar arch= 57cm/s without AVF compression, 121cm/s with AVF compression   Summary: Patent left brachiocephalic AVF. There is evidence of a competing branch. PW Doppler of left palmar arch with and without compression reveals evidence of steal syndrome.  *See table(s) above for measurements and observations.  Diagnosing physician: Harold Barban MD Electronically signed by Harold Barban MD on 12/29/2020 at 7:21:39 PM.    --------------------------------------------------------------------------------   Final    VAS [place] STEAL EXAM  Result Date: 12/29/2020 DIALYSIS STEAL  Patient Name:  Kevin Meyer  Date of Exam:   12/29/2020 Medical Rec #:  [id number]    Accession #:    [id number] Date of Birth: 01/23/1971     Patient Gender: M Patient Age:   56 years Exam Location:  [facility] Procedure:      VAS [place] STEAL EXAM Referring Phys: Jamelle Haring --------------------------------------------------------------------------------  Reason for Exam: Ulcer. Access Site: Left Upper  Extremity. Access Type: Brachial-cephalic AVF. Limitations: Ulcers on multiple left digits Comparison Study: No prior study Performing Technologist: Maudry Mayhew MHA, RVT, RDCS, RDMS  Examination Guidelines: A complete evaluation includes B-mode imaging, spectral Doppler, color Doppler, and power Doppler as needed of all accessible portions of each vessel. Bilateral testing is considered an integral part of a complete examination. Limited examinations for reoccurring indications may be performed as noted.  Findings: +-----------------+-------------+----------+---------+----------+--------------+ Upper Arm        Diameter (cm)Depth (cm)BranchingPSV (cm/s) Flow Volume   Arteriovenous                                                 (ml/min)    Fistula                                                                   +-----------------+-------------+----------+---------+----------+--------------+ Distal Brachial                                     181         259       Artery                                                                    +-----------------+-------------+----------+---------+----------+--------------+ AVF anastomosis                                     162                   +-----------------+-------------+----------+---------+----------+--------------+ outflow vein          0.8                           240                   distal upper arm                                                          +-----------------+-------------+----------+---------+----------+--------------+ outflow vein mid      0.7               branching   116  upper arm                                                                 +-----------------+-------------+----------+---------+----------+--------------+  Competing branches noted in the upper arm arterio venous fistula. Some data imported from [facility] 12/29/2020   Summary: Palmar arch signal increases with AVF compression, going from 57cm/s to 121cm/s.  Physiologic study: PPG signal increases in both third and fourth left digit with AVF compression. This is suggestive of steal syndrome. *See table(s) above for measurements and observations. Diagnosing physician: Harold Barban MD Electronically signed by Harold Barban MD on 12/29/2020 at 7:23:22 PM.    Final     Medications:  [MAR Hold] sodium chloride     [MAR Hold] sodium chloride     [MAR Hold] sodium chloride     ceFAZolin     [MAR Hold] vancomycin Stopped (12/28/20 2015)    [MAR Hold] allopurinol  100 mg Oral Daily   [MAR Hold] aspirin EC  81 mg Oral Daily   [MAR Hold] atorvastatin  40 mg Oral QHS   [MAR Hold] cephALEXin  500 mg Oral Q12H   [MAR Hold] Chlorhexidine Gluconate Cloth  6 each Topical Q0600   [MAR Hold] cholecalciferol  2,000 Units Oral Daily   [MAR Hold] feeding supplement (NEPRO CARB STEADY)  237 mL Oral BID BM   [MAR Hold] folic acid  1 mg Oral Daily   [MAR Hold] heparin  5,000 Units Subcutaneous Q8H   hepatitis b vaccine  2 mL Intramuscular Once   [MAR Hold] insulin aspart  0-6 Units Subcutaneous Q4H   [MAR Hold] insulin glargine-yfgn  20 Units Subcutaneous Daily   [MAR Hold] midodrine  5 mg Oral Once per day on Mon Wed Fri   [MAR Hold] multivitamin  1 tablet Oral QHS   oxyCODONE       [MAR Hold] sodium chloride flush  3 mL Intravenous Q12H   [MAR Hold] sucroferric oxyhydroxide  1,000 mg Oral TID WC   [MAR Hold] cyanocobalamin  1,000 mcg Oral Daily    Dialysis Orders: MWF at [facility] 4hr, 400/600, EDW 99.5kg, 2K/2.5Ca, AVF + TDC, heparin 5000 - Calcitriol 0.17mg PO q HD - No ESA  Assessment/Plan: 1. Chills/rigors/vomiting: Etiology unclear. ID has been consulted today by primary. Continue IV Vanc and PO Keflex. Plan for HD 9/7 when patient returns from the OR: Continue UFP #2 and ensure patient is pre-medicated with zofran and midodrine pre - HD.  2. LUE AVF steal  syndrome with ischemic ulcers to L hand: VVS following, s/p angiography 9/1- plan for DRIL procedure 9/7. On IV Vanc + PO Keflex. 3. ESRD: Back to usual MWF schedule - HD on 9/7. K+ level now 4.7-will change to 2K bath. 4. HTN/volume: BP low/stable, follow. 5. Anemia: Hgb 9.6, no ESA for now. 6. Secondary hyperparathyroidism: Ca ok, Phos high - continue Velphoro, dose recently raised. 7. Nutrition: Alb low, continue protein supplements. 8. 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 According to the sensitivities, he is resistant to Cefazolin. I discussed these findings with Dr. SJonnie Finnerand Dr. PDoristine Bosworth Also notified Dr. NJoellyn Haff(ID) via secured chat. [facility] AM to have these results [facility] to [facility]. Will discuss further with Primary and ID in AM for recommendations.  Tobie Poet, NP [facility] 12/30/2020,4:19 PM  LOS: 6 days

## 2020-12-30 NOTE — Progress Notes (Signed)
PROGRESS NOTE    Kevin Meyer  UQJ:[id number] DOB: [birth year] DOA: 12/24/2020 PCP: Raiford Simmonds., PA-C   Brief Narrative:  Plan Kevin Meyer is a 50 year old gentleman with past history of essential hypertension, hyperlipidemia, OA, gout, anemia, type 2 diabetes mellitus, diabetic nephropathy, ESRD on HD MWF s/p right-sided tunneled catheter and left AV fistula, obesity, chronic systolic CHF [facility] with complaint of fever and left hand pain.  Was diagnosed with ulcers and vascular insufficiency as well as infection of the left fingers.  Transferred to [facility] for vascular consult.  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  Patient underwent angiogram/aortogram and selective catheterization of the left brachial artery with left upper extremity runoff.  Vein mapping is done.  Plan for DRIL procedure today.  Patient did develop fever with a T-max of 101.2 with tachycardia afternoon of 12/26/2020 and met criteria for sepsis.  Per vascular surgery recommendation, continue vancomycin and Keflex.  Blood cultures were drawn and pending.  Lactic acid normal.  Has remained afebrile until 10 PM of 12/28/2020 when he had T-max of 103.    High-grade intermittent fever with elevated LFTs: Due to unknown etiology/source of fever, ID was consulted this morning.  We later learned that patient had hepatitis B exposure in dialysis unit on 12/26/2020 and that all the patient's are being screened for hepatitis and the labs are already  ordered after nephrology/dialysis unit has consulted with ID.  Patient has rising LFTs.  Information was not available to me while I saw this patient.  Patient has been in the OR all day long.  Will discuss with patient once patient returns.  Will defer to ID for further work-up.  Patient on IV vancomycin and oral Keflex already.  ESRD on HD: Per nephrology.  Hypokalemia: Resolved.  Hypotension: Chronic and at baseline without symptoms.  He is on midodrine.  Labetalol discontinued.  Type 2 diabetes mellitus with nephropathy: Hemoglobin A1c 7.2.  Continue SSI.  Hyperlipidemia: Continue statin.  Class II obesity: Counseling for weight loss provided.  DVT prophylaxis: heparin injection 5,000 Units Start: 12/25/20 2200 SCDs Start: 12/24/20 1426   Code Status: Full Code  Family Communication:  None present at bedside.  Plan of care discussed with patient in length and he verbalized understanding and agreed with it.  Status is: Inpatient  Remains inpatient appropriate because:Ongoing diagnostic testing needed not appropriate for outpatient work up  Dispo: The patient is from: Home              Anticipated d/c is to: Home              Patient currently is not medically stable to d/c.   Difficult to place patient No        Estimated body mass index is 35.94 kg/m as calculated from the following:   Height as of this encounter: 5' 6" (1.676 m).   Weight as of this encounter: 101 kg.     Nutritional Assessment: Body mass index is 35.94 kg/m.Marland Kitchen  Seen by dietician.  I agree with the assessment and plan as outlined below: Nutrition Status: Nutrition Problem: Increased nutrient needs Etiology: wound healing Signs/Symptoms: estimated needs Interventions: Nepro shake, MVI  .  Skin Assessment: I have examined the patient's skin and I agree with the wound assessment as performed by the wound care RN as outlined below:    Consultants:  Vascular surgery ID  Procedures:  As  above  Antimicrobials:  Anti-infectives (From admission, onward)    Start     Dose/Rate Route Frequency Ordered Stop   12/30/20 0755  ceFAZolin (ANCEF) 2-4 GM/100ML-% IVPB       Note to Pharmacy: Luciana Axe   : cabinet override      12/30/20 0755 12/30/20 1959   12/26/20 0745  vancomycin (VANCOCIN) IVPB 1000 mg/200 mL premix        1,000 mg 200 mL/hr over 60 Minutes Intravenous  Once 12/26/20 0736 12/26/20 1030   12/25/20 2200  [MAR Hold]  cephALEXin (KEFLEX) capsule 500 mg        (MAR Hold since Wed 12/30/2020 at 0723.Hold Reason: Transfer to a Procedural area)   500 mg Oral Every 12 hours 12/25/20 1056     12/25/20 1600  ceFEPIme (MAXIPIME) 2 g in sodium chloride 0.9 % 100 mL IVPB  Status:  Discontinued        2 g 200 mL/hr over 30 Minutes Intravenous Every M-W-F (Hemodialysis) 12/24/20 1233 12/25/20 1056   12/25/20 1600  [MAR Hold]  vancomycin (VANCOCIN) IVPB 1000 mg/200 mL premix        (MAR Hold since Wed 12/30/2020 at 0723.Hold Reason: Transfer to a Procedural area)   1,000 mg 200 mL/hr over 60 Minutes Intravenous Every M-W-F (Hemodialysis) 12/24/20 1233     12/24/20 1000  vancomycin (VANCOREADY) IVPB 2000 mg/400 mL        2,000 mg 200 mL/hr over 120 Minutes Intravenous  Once 12/24/20 0946 12/24/20 1455   12/24/20 0945  ceFEPIme (MAXIPIME) 2 g in sodium chloride 0.9 % 100 mL IVPB        2 g 200 mL/hr over 30 Minutes Intravenous  Once 12/24/20 0941 12/24/20 1133   12/24/20 0945  vancomycin (VANCOCIN) IVPB 1000 mg/200 mL premix  Status:  Discontinued        1,000 mg 200 mL/hr over 60 Minutes Intravenous  Once 12/24/20 0941 12/24/20 0946   12/24/20 0945  metroNIDAZOLE (FLAGYL) IVPB 500 mg  Status:  Discontinued        500 mg 100 mL/hr over 60 Minutes Intravenous Every 12 hours 12/24/20 0941 12/25/20 1056          Subjective: Seen and examined.  No new complaint other than pain in the fingers.  Objective: Vitals:   12/30/20 0421 12/30/20 1315 12/30/20 1345 12/30/20 1445   BP: 132/69 130/73 140/77 (!) 150/80  Pulse: 83 89 84 85  Resp: 18 (!) _0 Temp: 97.6 F (36.4 C) 98.2 F (36.8 C)    TempSrc: Oral     SpO2: 100% 94% 99% 94%  Weight:      Height:        Intake/Output Summary (Last 24 hours) at 12/30/2020 1527 Last data filed at 12/30/2020 1220 Gross per 24 hour  Intake 740 ml  Output 310 ml  Net 430 ml    Filed Weights   12/26/20 1123 12/28/20 1715 12/28/20 2015  Weight: 97.5 kg 101.7 kg 101 kg    Examination:  General exam: Appears calm  and comfortable  Respiratory system: Clear to auscultation. Respiratory effort normal. Cardiovascular system: S1 & S2 heard, RRR. No JVD, murmurs, rubs, gallops or clicks. No pedal edema. Gastrointestinal system: Abdomen is nondistended, soft and nontender. No organomegaly or masses felt. Normal bowel sounds heard. Central nervous system: Alert and oriented. No focal neurological deficits. Extremities: Symmetric 5 x 5 power. Skin: Ulceration and erythema in the multiple fingers in the left hand. Psychiatry: Judgement and insight appear normal. Mood & affect appropriate.    Data Reviewed: I have personally reviewed following labs and imaging studies  CBC: Recent Labs  Lab 12/26/20 0717 12/27/20 0149 12/28/20 0116 12/29/20 0248 12/30/20 0218  WBC 5.3 11.5* 8.7 10.6* 7.3  NEUTROABS 3.1 8.2* 6.8 8.9* 5.4  HGB 10.2* 10.7* 9.6* 10.0* 9.6*  HCT 31.8* 32.5* 29.2* 30.7* 29.1*  MCV 91.9 91.5 91.5 91.9 91.8  PLT 132* 133* 147* 180 858    Basic Metabolic Panel: Recent Labs  Lab 12/25/20 1544 12/26/20 0717 12/27/20 0149 12/28/20 0116 12/29/20 0248 12/30/20 0218  NA 134* 135 132* 133* 133*  --   K 3.8 3.4* 3.7 3.7 4.7  --   CL 95* 97* 93* 94* 96*  --   CO2 20* _0 --   GLUCOSE 182* 87 156* 149* 241*  --   BUN 65* 69* 39* 66* 38*  --   CREATININE 8.16*  7.95* 8.92* 6.13* 7.63* 5.56*  --   CALCIUM 8.8* 8.7* 8.6* 8.7* 8.8*  --   MG  --  2.2 2.1 2.4 2.1 2.3  PHOS 6.4* 6.3* 4.1  5.1* 2.2* 3.0    GFR: Estimated Creatinine Clearance: 17.7 mL/min (A) (by C-G formula based on SCr of 5.56 mg/dL (H)). Liver Function Tests: Recent Labs  Lab 12/25/20 0214 12/25/20 1544 12/26/20 0717 12/27/20 0149 12/28/20 0116 12/29/20 0248  AST 46*  --  33 84* 90* 111*  ALT 40  --  33 77* 94* 120*  ALKPHOS 46  --  49 63 54 72  BILITOT 0.7  --  0.7 0.6 0.3 0.6  PROT 6.2*  --  6.3* 6.5 6.2* 6.5  ALBUMIN 3.1* 3.4* 3.1* 3.1* 2.9* 3.0*    No results for input(s): LIPASE, AMYLASE in the last 168 hours. No results for input(s): AMMONIA in the last 168 hours. Coagulation Profile: Recent Labs  Lab 12/24/20 1013  INR 1.0    Cardiac Enzymes: No results for input(s): CKTOTAL, CKMB, CKMBINDEX, TROPONINI in the last 168 hours. BNP (last 3 results) No results for input(s): PROBNP in the last 8760 hours. HbA1C: No results for input(s): HGBA1C in the last 72 hours.  CBG: Recent Labs  Lab 12/29/20 1655 12/29/20 1950 12/30/20 0041 12/30/20 0419 12/30/20 1328  GLUCAP 122* 256* 213* 167* 219*    Lipid Profile: No results for input(s): CHOL, HDL, LDLCALC, TRIG, CHOLHDL, LDLDIRECT in the last 72 hours. Thyroid Function Tests: No results for input(s): TSH, T4TOTAL, FREET4, T3FREE, THYROIDAB in the last 72 hours. Anemia Panel: No results for input(s): VITAMINB12, FOLATE, FERRITIN, TIBC, IRON, RETICCTPCT in the last 72 hours. Sepsis Labs: Recent Labs  Lab 12/24/20 1013 12/24/20 1143 12/26/20 1337  LATICACIDVEN 1.5 1.3 1.2     Recent Results (from the past 240 hour(s))  Blood Culture (routine x 2)     Status: None   Collection Time: 12/24/20 10:13 AM   Specimen: BLOOD RIGHT WRIST  Result Value Ref Range Status   Specimen Description BLOOD RIGHT WRIST  Final  Special Requests   Final    BOTTLES DRAWN AEROBIC AND ANAEROBIC Blood Culture adequate volume   Culture   Final    NO GROWTH 5 DAYS Performed at [facility], [address] [place], [place] [address]     Report Status 12/29/2020 FINAL  Final  Blood Culture (routine x 2)     Status: None   Collection Time: 12/24/20 10:14 AM   Specimen: Right Antecubital; Blood  Result Value Ref Range Status   Specimen Description RIGHT ANTECUBITAL  Final   Special Requests   Final    BOTTLES DRAWN AEROBIC AND ANAEROBIC Blood Culture adequate volume   Culture   Final    NO GROWTH 5 DAYS Performed at [facility], [address], [place], [place] [address]    Report Status 12/29/2020 FINAL  Final  Resp Panel by RT-PCR (Flu A&B, Covid) Nasopharyngeal Swab     Status: None   Collection Time: 12/24/20 11:08 AM   Specimen: Nasopharyngeal Swab; Nasopharyngeal(NP) swabs in vial transport medium  Result Value Ref Range Status   SARS Coronavirus 2 by RT PCR NEGATIVE NEGATIVE Final    Comment: (NOTE) SARS-CoV-2 target nucleic acids are NOT DETECTED.  The SARS-CoV-2 RNA is generally detectable in upper respiratory specimens during the acute phase of infection. The lowest concentration of SARS-CoV-2 viral copies this assay can detect is 138 copies/mL. A negative result does not preclude SARS-Cov-2 infection and should not be used as the sole basis for treatment or other patient management decisions. A negative result may occur with  improper specimen collection/handling, submission of specimen other than nasopharyngeal swab, presence of viral mutation(s) within the areas targeted by this assay, and inadequate number of viral copies(<138 copies/mL). A negative result must be combined with clinical observations, patient history, and epidemiological information. The expected result is Negative.  Fact Sheet for Patients:  EntrepreneurPulse.com.au  Fact Sheet for Healthcare Providers:  IncredibleEmployment.be  This test is no t yet approved or cleared by the [place] FDA and  has been authorized for detection and/or diagnosis of SARS-CoV-2 by FDA under an Emergency Use  Authorization (EUA). This EUA will remain  in effect (meaning this test can be used) for the duration of the COVID-19 declaration under Section 564(b)(1) of the Act, 21 U.S.C.section 360bbb-3(b)(1), unless the authorization is terminated  or revoked sooner.       Influenza A by PCR NEGATIVE NEGATIVE Final   Influenza B by PCR NEGATIVE NEGATIVE Final    Comment: (NOTE) The Xpert Xpress SARS-CoV-2/FLU/RSV plus assay is intended as an aid in the diagnosis of influenza from Nasopharyngeal swab specimens and should not be used as a sole basis for treatment. Nasal washings and aspirates are unacceptable for Xpert Xpress SARS-CoV-2/FLU/RSV testing.  Fact Sheet for Patients: EntrepreneurPulse.com.au  Fact Sheet for Healthcare Providers: IncredibleEmployment.be  This test is not yet approved or cleared by the [place] FDA and has been authorized for detection and/or diagnosis of SARS-CoV-2 by FDA under an Emergency Use Authorization (EUA). This EUA will remain in effect (meaning this test can be used) for the duration of the COVID-19 declaration under Section 564(b)(1) of the Act, 21 U.S.C. section 360bbb-3(b)(1), unless the authorization is terminated or revoked.  Performed at [facility], [address], [place], [place] [address]   Culture, blood (routine x 2)     Status: None (Preliminary result)   Collection Time: 12/26/20  1:52 PM   Specimen: BLOOD  Result Value Ref Range Status   Specimen  Description BLOOD BLOOD LEFT FOREARM  Final   Special Requests   Final    BOTTLES DRAWN AEROBIC AND ANAEROBIC Blood Culture adequate volume   Culture   Final    NO GROWTH 4 DAYS Performed at [facility], [address] [place], [place] [address]    Report Status PENDING  Incomplete  Culture, blood (routine x 2)     Status: None (Preliminary result)   Collection Time: 12/26/20  3:26 PM   Specimen: BLOOD  Result Value Ref Range Status    Specimen Description BLOOD RIGHT ANTECUBITAL  Final   Special Requests   Final    BOTTLES DRAWN AEROBIC ONLY Blood Culture adequate volume   Culture   Final    NO GROWTH 4 DAYS Performed at [facility], [place] [address] [place], [place] [address]    Report Status PENDING  Incomplete  Surgical pcr screen     Status: None   Collection Time: 12/29/20 12:35 PM   Specimen: Nasal Mucosa; Nasal Swab  Result Value Ref Range Status   MRSA, PCR NEGATIVE NEGATIVE Final   Staphylococcus aureus NEGATIVE NEGATIVE Final    Comment: (NOTE) The Xpert SA Assay (FDA approved for NASAL specimens in patients 56 years of age and older), is one component of a comprehensive surveillance program. It is not intended to diagnose infection nor to guide or monitor treatment. Performed at [facility], [place] [address] [place], [place] [address]        Radiology Studies: VAS US DUPLEX DIALYSIS ACCESS (AVF, AVG)  Result Date: 12/29/2020 DIALYSIS ACCESS Patient Name:  Kevin Meyer  Date of Exam:   12/29/2020 Medical Rec #: [id number]    Accession #:    [id number] Date of Birth: Dec 14, 1970     Patient Gender: M Patient Age:   65 years Exam Location:  [facility] Procedure:      VAS US DUPLEX DIALYSIS ACCESS (AVF, AVG) Referring Phys: Jamelle Haring --------------------------------------------------------------------------------  Reason for Exam: Left digit ulcers. Access Site: Left Upper Extremity. Access Type: Brachial-cephalic AVF. Comparison Study: No prior study Performing Technologist: Maudry Mayhew MHA, RDMS, RVT, RDCS  Examination Guidelines: A complete evaluation includes B-mode imaging, spectral Doppler, color Doppler, and power Doppler as needed of all accessible portions of each vessel. Unilateral testing is considered an integral part of a complete examination. Limited examinations for reoccurring indications may be performed as noted.  Findings:  +--------------------+----------+-----------------+--------+ AVF                 PSV (cm/s)Flow Vol (mL/min)Comments +--------------------+----------+-----------------+--------+ Native artery inflow   181           259                +--------------------+----------+-----------------+--------+ AVF Anastomosis        162                              +--------------------+----------+-----------------+--------+  +------------+---------+------------+----------+-------------------------------+ OUTFLOW VEIN   PSV     Diameter  Depth (cm)           Describe                          (cm/s)      (cm)                                              +------------+---------+------------+----------+-------------------------------+  Prox UA        404       0.47                                              +------------+---------+------------+----------+-------------------------------+ Mid UA         116       0.69                competing branch measuring                                                           0.37cm              +------------+---------+------------+----------+-------------------------------+ Dist UA        240       0.81                                              +------------+---------+------------+----------+-------------------------------+ Left palmar arch= 57cm/s without AVF compression, 121cm/s with AVF compression   Summary: Patent left brachiocephalic AVF. There is evidence of a competing branch. PW Doppler of left palmar arch with and without compression reveals evidence of steal syndrome.  *See table(s) above for measurements and observations.  Diagnosing physician: Harold Barban MD Electronically signed by Harold Barban MD on 12/29/2020 at 7:21:39 PM.    --------------------------------------------------------------------------------   Final    VAS [place] STEAL EXAM  Result Date: 12/29/2020 DIALYSIS STEAL  Patient Name:  Kevin Meyer  Date of Exam:    12/29/2020 Medical Rec #: [id number]    Accession #:    [id number] Date of Birth: March 03, 1971     Patient Gender: M Patient Age:   51 years Exam Location:  [facility] Procedure:      VAS [place] STEAL EXAM Referring Phys: Jamelle Haring --------------------------------------------------------------------------------  Reason for Exam: Ulcer. Access Site: Left Upper Extremity. Access Type: Brachial-cephalic AVF. Limitations: Ulcers on multiple left digits Comparison Study: No prior study Performing Technologist: Maudry Mayhew MHA, RVT, RDCS, RDMS  Examination Guidelines: A complete evaluation includes B-mode imaging, spectral Doppler, color Doppler, and power Doppler as needed of all accessible portions of each vessel. Bilateral testing is considered an integral part of a complete examination. Limited examinations for reoccurring indications may be performed as noted.  Findings: +-----------------+-------------+----------+---------+----------+--------------+ Upper Arm        Diameter (cm)Depth (cm)BranchingPSV (cm/s) Flow Volume   Arteriovenous                                                 (ml/min)    Fistula                                                                   +-----------------+-------------+----------+---------+----------+--------------+  Distal Brachial                                     181         259       Artery                                                                    +-----------------+-------------+----------+---------+----------+--------------+ AVF anastomosis                                     162                   +-----------------+-------------+----------+---------+----------+--------------+ outflow vein          0.8                           240                   distal upper arm                                                          +-----------------+-------------+----------+---------+----------+--------------+  outflow vein mid      0.7               branching   116                   upper arm                                                                 +-----------------+-------------+----------+---------+----------+--------------+  Competing branches noted in the upper arm arterio venous fistula. Some data imported from Duplex Dialysis Access 12/29/2020  Summary: Palmar arch signal increases with AVF compression, going from 57cm/s to 121cm/s.  Physiologic study: PPG signal increases in both third and fourth left digit with AVF compression. This is suggestive of steal syndrome. *See table(s) above for measurements and observations. Diagnosing physician: Harold Barban MD Electronically signed by Harold Barban MD on 12/29/2020 at 7:23:22 PM.    Final     Scheduled Meds:  [MAR Hold] allopurinol  100 mg Oral Daily   [MAR Hold] aspirin EC  81 mg Oral Daily   [MAR Hold] atorvastatin  40 mg Oral QHS   [MAR Hold] cephALEXin  500 mg Oral Q12H   [MAR Hold] Chlorhexidine Gluconate Cloth  6 each Topical Q0600   [MAR Hold] cholecalciferol  2,000 Units Oral Daily   [MAR Hold] feeding supplement (NEPRO CARB STEADY)  237 mL Oral BID BM   [MAR Hold] folic acid  1 mg Oral Daily   [MAR Hold] heparin  5,000 Units Subcutaneous Q8H  hepatitis b vaccine  2 mL Intramuscular Once   [MAR Hold] insulin aspart  0-6 Units Subcutaneous Q4H   [MAR Hold] insulin glargine-yfgn  20 Units Subcutaneous Daily   [MAR Hold] midodrine  5 mg Oral Once per day on Mon Wed Fri   [facility] Hold] multivitamin  1 tablet Oral QHS   oxyCODONE       [MAR Hold] sodium chloride flush  3 mL Intravenous Q12H   [MAR Hold] sucroferric oxyhydroxide  1,000 mg Oral TID WC   [MAR Hold] cyanocobalamin  1,000 mcg Oral Daily   Continuous Infusions:  [MAR Hold] sodium chloride     [MAR Hold] sodium chloride     [MAR Hold] sodium chloride     ceFAZolin     [MAR Hold] vancomycin Stopped (12/28/20 2015)     LOS: 6 days   Time spent: 25  minutes   Darliss Cheney, MD [facility]  12/30/2020, 3:27 PM   How to contact the [facility] Attending or Consulting provider [place] or covering provider during after hours [place], for this patient?  Check the care team in [facility] and look for a) attending/consulting [facility] provider listed and b) the [facility] team listed. Page or secure chat [phone]. Log into [email or web address] and use [place]'s universal password to access. If you do not have the password, please contact the hospital operator. Locate the [facility] provider you are looking for under [facility] and page to a number that you can be directly reached. If you still have difficulty reaching the provider, please page the [facility] (Director on Call) for the Hospitalists listed on amion for assistance.

## 2020-12-30 NOTE — Anesthesia Procedure Notes (Signed)
Procedure Name: Intubation Date/Time: 12/30/2020 8:37 AM Performed by: Jenne Campus, CRNA Pre-anesthesia Checklist: Patient identified, Emergency Drugs available, Suction available and Patient being monitored Patient Re-evaluated:Patient Re-evaluated prior to induction Oxygen Delivery Method: Circle System Utilized Preoxygenation: Pre-oxygenation with 100% oxygen Induction Type: IV induction Ventilation: Mask ventilation without difficulty Laryngoscope Size: Miller and 3 Grade View: Grade III Tube type: Oral Tube size: 7.5 mm Number of attempts: 1 Airway Equipment and Method: Stylet and Oral airway Placement Confirmation: ETT inserted through vocal cords under direct vision, positive ETCO2 and breath sounds checked- equal and bilateral Secured at: 22 cm Tube secured with: Tape Dental Injury: Teeth and Oropharynx as per pre-operative assessment  Difficulty Due To: Difficulty was anticipated and Difficult Airway- due to reduced neck mobility

## 2020-12-30 NOTE — Transfer of Care (Signed)
Immediate Anesthesia Transfer of Care Note  Patient: Kevin Meyer  Procedure(s) Performed: LEFT BRACHIAL TO BRACHIAL BYPASS USING LEFT GSV, LIGATION OF LEFT BRACHIAL ARTERY (Left) VEIN HARVEST OF LEFT GSV (Left)  Patient Location: PACU  Anesthesia Type:General  Level of Consciousness: awake, oriented and patient cooperative  Airway & Oxygen Therapy: Patient Spontanous Breathing and Patient connected to face mask oxygen  Post-op Assessment: Report given to RN and Post -op Vital signs reviewed and stable  Post vital signs: Reviewed  Last Vitals:  Vitals Value Taken Time  BP 130/73 12/30/20 1315  Temp    Pulse 88 12/30/20 1329  Resp 17 12/30/20 1329  SpO2 99 % 12/30/20 1329  Vitals shown include unvalidated device data.  Last Pain:  Vitals:   12/30/20 0421  TempSrc: Oral  PainSc:       Patients Stated Pain Goal: 0 ([id number])  Complications: No notable events documented.

## 2020-12-30 NOTE — Progress Notes (Signed)
[facility] [place] PROGRESS NOTE  ASSESSMENT / PLAN: Kevin Meyer is a 50 y.o. 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 Plan OR tomorrow for left brachial-brachial bypass with interposition ligation (DRIL).   SUBJECTIVE: No complaints. Reviewed OR plan.  OBJECTIVE: BP 132/69 (BP Location: Right Arm)   Pulse 83   Temp 97.6 F (36.4 C) (Oral)   Resp 18   Ht '5\' 6"'$  (1.676 m)   Wt 101 kg   SpO2 100%   BMI 35.94 kg/m   Intake/Output Summary (Last 24 hours) at 12/30/2020 0729 Last data filed at 12/29/2020 2200 Gross per 24 hour  Intake 870 ml  Output 270 ml  Net 600 ml     No distress Good thrill in fistula No radial pulse with or without compression of fistula Unchanged appearance of digital ulceration  CBC Latest Ref Rng & Units 12/30/2020 12/29/2020 12/28/2020  WBC 4.0 - 10.5 K/uL 7.3 10.6(H) 8.7  Hemoglobin 13.0 - 17.0 g/dL 9.6(L) 10.0(L) 9.6(L)  Hematocrit 39.0 - 52.0 % 29.1(L) 30.7(L) 29.2(L)  Platelets 150 - 400 K/uL 183 180 147(L)     CMP Latest Ref Rng & Units 12/29/2020 12/28/2020 12/27/2020  Glucose 70 - 99 mg/dL 241(H) 149(H) 156(H)  BUN 6 - 20 mg/dL 38(H) 66(H) 39(H)  Creatinine 0.61 - 1.24 mg/dL 5.56(H) 7.63(H) 6.13(H)  Sodium 135 - 145 mmol/L 133(L) 133(L) 132(L)  Potassium 3.5 - 5.1 mmol/L 4.7 3.7 3.7  Chloride 98 - 111 mmol/L 96(L) 94(L) 93(L)  CO2 22 - 32 mmol/L '29 28 27  '$ Calcium 8.9 - 10.3 mg/dL 8.8(L) 8.7(L) 8.6(L)  Total Protein 6.5 - 8.1 g/dL 6.5 6.2(L) 6.5  Total Bilirubin 0.3 - 1.2 mg/dL 0.6 0.3 0.6  Alkaline Phos 38 - 126 U/L 72 54 63  AST 15 - 41 U/L 111(H) 90(H) 84(H)  ALT 0 - 44 U/L 120(H) 94(H) 77(H)    Estimated Creatinine Clearance: 17.7 mL/min (A) (by C-G formula based on SCr of 5.56 mg/dL (H)).  Yevonne Aline. Stanford Breed, MD [facility] Phone Number: [phone] 12/30/2020 7:29 AM

## 2020-12-30 NOTE — Op Note (Signed)
DATE OF SERVICE: 12/30/2020  PATIENT:  Kevin Meyer  50 y.o. male  PRE-OPERATIVE DIAGNOSIS:  ESRD, dialysis access related left hand steal syndrome  POST-OPERATIVE DIAGNOSIS:  Same  PROCEDURE:   Left upper extremity distal revascularization with interval ligation (DRIL) 1) Left brachial-brachial bypass 2) Left greater saphenous vein harvest 3) Left brachial artery ligation (between arteriovenous fistula and bypass anastomosis).  SURGEON:  Surgeon(s) and Role:    * Eddie Koc, Yevonne Aline, MD - Primary  ASSISTANT: Arlee Muslim, PA-C  An assistant was required to facilitate exposure and expedite the case.  ANESTHESIA:   general  EBL: 256m  BLOOD ADMINISTERED:none  DRAINS: none   LOCAL MEDICATIONS USED:  NONE  SPECIMEN:  none  COUNTS: confirmed correct.  TOURNIQUET:  none  PATIENT DISPOSITION:  PACU - hemodynamically stable.   Delay start of Pharmacological VTE agent (>24hrs) due to surgical blood loss or risk of bleeding: no  INDICATION FOR PROCEDURE: LLamondre Harvinis a 50y.o. male with end-stage renal disease with a left upper extremity brachiobasilic AV fistula.  The patient presented to the hospital with ulceration about his left hand.  Access related hand ischemia work-up was initiated.  Angiogram showed no inflow disease.  Duplex showed low flow fistula.  Finger PPG showed augmentation with compression of the fistula. After careful discussion of risks, benefits, and alternatives the patient was offered distal revascularization with interval ligation. The patient understood and wished to proceed.  OPERATIVE FINDINGS: Healthy left greater saphenous vein harvested from saphenofemoral junction to mid thigh.  Brachial artery exposed mid arm.  Brachial artery exposed distal to arteriovenous fistula anastomosis to the brachial bifurcation.  Proximal anastomosis created.  Bypass delivered in subfascial tunnel to distal brachial artery.  Distal anastomosis completed.  Doppler flow  confirmed in the wrist and fistula.  Doppler flow in the wrist did not change with occlusion of the interval brachial artery.  The interval brachial artery was ligated with 2-0 silk sutures.  DESCRIPTION OF PROCEDURE: After identification of the patient in the pre-operative holding area, the patient was transferred to the operating room. The patient was positioned supine on the operating room table. Anesthesia was induced. The left arm and left leg was prepped and draped in standard fashion. A surgical pause was performed confirming correct patient, procedure, and operative location.  Using intraoperative ultrasound the course of the left greater saphenous vein was mapped on the skin.  Skip incisions were used to expose the greater saphenous vein.  The vein was exposed circumferentially.  All large branches were ligated and divided.  The entire vein was skeletonized.  The proximal and distal aspects of the vein segment were ligated with 2-0 silk suture.  The vein segment was excised and allowed to dwell in a heparinized saline solution for use later.  The previous scar over the antecubital fossa was opened with a 10 blade.  The incision was carried down through subtenons tissue until the fistula was identified.  Sharp dissection was used to expose the fistula anastomosis.  I carried our exposure distally on the brachial artery until the artery bifurcated into the radial and ulnar arteries.  These arteries were doubly looped with Silastic Vesseloops for distal control.  The brachial artery immediately distal to the arteriovenous fistula was encircled with a 0 silk suture for later use.  10 cm of distance was measured from this point to the mid arm for proximal anastomosis.  A longitudinal incision was made over the medial arm carried down through subtenons tissue  until the fascia of the arm was identified and sharply divided.  The brachial sheath structures were skeletonized.  Great care was taken to avoid  injury to the nerve structures.  The brachial artery was encircled proximally distally with large Silastic Vesseloops.  A sheath tunneling device was used to connect the two exposures in a subfascial position.  The patient was systemically heparinized.  Heparin was periodically redosed to maintain adequate anticoagulation.  The proximal brachial artery was clamped proximally distally.  A longitudinal arteriotomy was made with 11 blade and extended with Potts scissors.  The vein was reversed.  The distal aspect of the vein was spatulated to allow end-to-side anastomosis to the arteriotomy.  This was performed using continuous running suture of 6-0 Prolene.  After completion the anastomosis was flushed down the open end of the vascular graft.  Hemostasis was confirmed in the anastomosis.    The bypass graft was pressurized and the anterior surface of the vein marked.  No bleeding was noted in the graft.  The graft was secured to the obturator of the tunneling device and delivered through the subfascial tunnel taking great care to avoid twisting or kinking the graft.  The graft was carried to the distal exposure and tested.  Pulsatile flow was noted.  A bulldog clamp was applied to the graft.  Clamps were applied to the brachial artery and radial and ulnar arteries.  A longitudinal incision was made with 11 blade extended with Potts scissors.  The free end of the vascular graft was anastomosed end-to-side to the arteriotomy using continuous running suture of 6-0 Prolene.  Immediately prior to completion the anastomosis flushed and de-aired.  The clamps were released.  Good flow was confirmed in the radial artery.  Stenosis was confirmed in the anastomosis.  The brachial artery was temporarily occluded.  Doppler flow was confirmed in the radial artery, this did not change much when the brachial artery was opened.  I elected to ligate the brachial artery.  Two #0 silk sutures were used to ligate the brachial  artery.  Completion biphasic radial signal was heard.  The bypass had a palpable pulse.  Brisk Doppler flow was noted distal to the anastomosis and the brachial artery.  Strong thrill was heard in the arteriovenous fistula.  Heparin was reversed with protamine.  Hemostasis was achieved in the surgical beds and anastomoses.  Wounds were closed in layers using 3-0 Vicryl, 3-0 Vicryl, 4-0 Monocryl.  Sterile bandages were applied Upon completion of the case instrument and sharps counts were confirmed correct. The patient was transferred to the PACU in good condition. I was present for all portions of the procedure.  Yevonne Aline. Stanford Breed, MD [facility] Phone Number: [phone] 12/30/2020 12:38 PM

## 2020-12-30 NOTE — Anesthesia Postprocedure Evaluation (Signed)
Anesthesia Post Note  Patient: Kevin Meyer  Procedure(s) Performed: LEFT BRACHIAL TO BRACHIAL BYPASS USING LEFT GSV, LIGATION OF LEFT BRACHIAL ARTERY (Left) VEIN HARVEST OF LEFT GSV (Left)     Patient location during evaluation: PACU Anesthesia Type: General Level of consciousness: awake and alert Pain management: pain level controlled Vital Signs Assessment: post-procedure vital signs reviewed and stable Respiratory status: spontaneous breathing, nonlabored ventilation, respiratory function stable and patient connected to nasal cannula oxygen Cardiovascular status: blood pressure returned to baseline and stable Postop Assessment: no apparent nausea or vomiting Anesthetic complications: no   No notable events documented.  Last Vitals:  Vitals:   12/30/20 0043 12/30/20 0421  BP: 119/63 132/69  Pulse: 85 83  Resp: 17 18  Temp: 36.8 C 36.4 C  SpO2:  100%    Last Pain:  Vitals:   12/30/20 0421  TempSrc: Oral  PainSc:                  Ledarrius Beauchaine S

## 2020-12-30 NOTE — Progress Notes (Signed)
Pt arrived to 4E from PACU. Telemetry applied and CHG bath done. A&O x4. VSS. Pt oriented to room and call bell within reach. Family at bedside.

## 2020-12-30 NOTE — Progress Notes (Signed)
Reached out to pharmacist regarding hepatitis B vaccine because pt stated he already had the vaccine. [facility] contacted me back at 1915 to let me know it was being administered since he was exposed.   Raelyn Number, RN

## 2020-12-30 NOTE — Anesthesia Preprocedure Evaluation (Addendum)
Anesthesia Evaluation  Patient identified by MRN, date of birth, ID band Patient awake    Reviewed: Allergy & Precautions, NPO status , Patient's Chart, lab work & pertinent test results  Airway Mallampati: II  TM Distance: >3 FB Neck ROM: Full    Dental no notable dental hx. (+) Poor Dentition, Dental Advisory Given,    Pulmonary neg pulmonary ROS,    Pulmonary exam normal breath sounds clear to auscultation       Cardiovascular hypertension, Pt. on medications Normal cardiovascular exam Rhythm:Regular Rate:Normal     Neuro/Psych negative neurological ROS  negative psych ROS   GI/Hepatic negative GI ROS, Neg liver ROS,   Endo/Other  diabetes, Poorly Controlled, Type 2, Oral Hypoglycemic Agents  Renal/GU DialysisRenal disease  negative genitourinary   Musculoskeletal negative musculoskeletal ROS (+)   Abdominal   Peds negative pediatric ROS (+)  Hematology negative hematology ROS (+)   Anesthesia Other Findings   Reproductive/Obstetrics negative OB ROS                            Anesthesia Physical Anesthesia Plan  ASA: 3  Anesthesia Plan: General   Post-op Pain Management:    Induction: Intravenous  PONV Risk Score and Plan: 2 and Ondansetron and Dexamethasone  Airway Management Planned: Oral ETT  Additional Equipment:   Intra-op Plan:   Post-operative Plan: Extubation in OR  Informed Consent: I have reviewed the patients History and Physical, chart, labs and discussed the procedure including the risks, benefits and alternatives for the proposed anesthesia with the patient or authorized representative who has indicated his/her understanding and acceptance.     Dental advisory given  Plan Discussed with: CRNA and Surgeon  Anesthesia Plan Comments:         Anesthesia Quick Evaluation

## 2020-12-30 NOTE — Progress Notes (Addendum)
Patient has been having recurrent rigors/ n/v on dialysis in OP setting w/ negative blood cx's. As an inpatient here he is also having fevers and again w/ negative blood cx's. When an ESRD pt w/ [facility] has recurrent fevers or rigors on HD, w/ negative cx's, we usually will assume they have a cx negative catheter infection and will remove / replace the catheter and give abx. We are concerned he may have a culture neg catheter sepsis/ infection. Would recommend ID consultation, have d/w pmd.   Kelly Splinter, MD 12/30/2020, 4:29 PM

## 2020-12-30 NOTE — Consult Note (Signed)
[facility]    Date of Admission:  12/24/2020   Total days of antibiotics 6        Keflex day 6        Vancomycin day 1               Reason for Consult: Fever with dialysis    Referring Provider: Dr. Doristine Bosworth   Assessment: Patient with ESRD on hemodialysis with left AV fistula he was hospitalized for fever, chills, rigor and nausea with HD. He has been using the right tunneled catheter for HD for 3 months and just started using the left AV fistula on 7/8.  Patient only exhibits those symptoms on HD days.  He reports doing well on non-HD days.  Low suspicion for a systemic infection given his symptoms only happen on HD days.  This could be hypotension vs dialysis disequilibrium.  No source of infection suspected.  Work-up has been unremarkable with negative blood culture x 2.  CXR clear. The right tunneled catheter appears clean and noninfected.  The wound of the left finger is likely ischemic ulceration without signs of active infection.  Physical exam is reassuring.  Low suspicion for endocarditis.  Will stop vancomycin and cephalexin.  He was also found to have elevated LFT which are trending up.  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  Hep B surface antigen was negative on 9/3. He reports up to date with Hep B vaccine a few months ago.   Plan: Stop vancomycin and cephalexin Hep B vaccine given Monitor LFT  Active Problems:   Hypertension   Type 2 diabetes with nephropathy (HCC)   Class 2 obesity due to excess calories with body mass index (BMI) of 39.0 to 39.9 in adult   Hyperlipidemia   Osteoarthritis   Diabetes mellitus type 2, controlled, with complications (HCC)   Infection of finger LEFT   Finger infection   Scheduled Meds:  [MAR Hold] allopurinol  100 mg Oral Daily   [MAR Hold] aspirin EC  81 mg Oral Daily   [MAR Hold] atorvastatin  40 mg Oral QHS   [MAR Hold] cephALEXin  500 mg Oral Q12H   [MAR Hold]  Chlorhexidine Gluconate Cloth  6 each Topical Q0600   [MAR Hold] cholecalciferol  2,000 Units Oral Daily   [MAR Hold] feeding supplement (NEPRO CARB STEADY)  237 mL Oral BID BM   [MAR Hold] folic acid  1 mg Oral Daily   [MAR Hold] heparin  5,000 Units Subcutaneous Q8H   hepatitis b vaccine  2 mL Intramuscular Once   [MAR Hold] insulin aspart  0-6 Units Subcutaneous Q4H   [MAR Hold] insulin glargine-yfgn  20 Units Subcutaneous Daily   [MAR Hold] midodrine  5 mg Oral Once per day on Mon Wed Fri   [facility] Hold] multivitamin  1 tablet Oral QHS   oxyCODONE       [MAR Hold] sodium chloride flush  3 mL Intravenous Q12H   [MAR Hold] sucroferric oxyhydroxide  1,000 mg Oral TID WC   [MAR Hold] cyanocobalamin  1,000 mcg Oral Daily   Continuous Infusions:  [MAR Hold] sodium chloride     [MAR Hold] sodium chloride     [MAR Hold] sodium chloride     ceFAZolin     [MAR Hold] vancomycin Stopped (12/28/20 2015)   PRN Meds:.[MAR Hold] sodium chloride, [MAR Hold] sodium chloride, [MAR Hold] sodium chloride, [MAR Hold]  acetaminophen, [MAR Hold] alteplase, [MAR Hold] heparin, [MAR Hold] hydrALAZINE, HYDROmorphone (DILAUDID) injection, [MAR Hold] lidocaine (PF), [MAR Hold] lidocaine-prilocaine, [MAR Hold] ondansetron **OR** [MAR Hold] ondansetron (ZOFRAN) IV, [MAR Hold] ondansetron (ZOFRAN) IV, oxyCODONE-acetaminophen, [MAR Hold] pentafluoroprop-tetrafluoroeth, promethazine, [MAR Hold] sodium chloride flush, [MAR Hold] sorbitol  HPI: Kevin Meyer is a 50 y.o. male with past medical history of hypertension, type 2 diabetes, ESRD on dialysis who was hospitalized for symptoms of fever, rigors and nausea with dialysis.  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  Patient underwent selective catheterization of the left brachial artery today.  Patient is seen at PACU.  He reports feeling well.  States that he started to have the symptoms last Monday.  Reports  having chills, nausea, shaky and fever during dialysis.  States that the symptom will resolve at the end of the day by keeping him warm.  States that he does not have any symptoms on nondialysis day.  Reports spiking fever on both days with dialysis this week.  Denies any abdominal pain, dysuria or GI issue.  Denies history of travel, insect or animal bites, or skin rash.  He has been using the right tunneled catheter for HD for 3 months.  They just started using the left AV fistula on 7/8.  He reports started seen ulceration on his left finger a few weeks ago.  States the wounds is dry without any purulent discharge or drainage.  He denies alcohol tobacco or IV drug use.   Review of Systems: ROS Per HPI  Past Medical History:  Diagnosis Date   Anemia    Diabetes mellitus without complication (HCC)    Gout    High cholesterol    Hypertension    Osteoarthritis    Renal disorder     Social History   Tobacco Use   Smoking status: Never   Smokeless tobacco: Never  Vaping Use   Vaping Use: Never used  Substance Use Topics   Alcohol use: Never    Comment: occasional   Drug use: Never    Family History  Problem Relation Age of Onset   Stroke Mother    Diabetes Other    Hypertension Father    Allergies  Allergen Reactions   Gabapentin Anaphylaxis   Lyrica [Pregabalin] Anaphylaxis    OBJECTIVE: Blood pressure (!) 142/75, pulse 84, temperature (!) 97.1 F (36.2 C), resp. rate 14, height '5\' 6"'$  (1.676 m), weight 101 kg, SpO2 95 %.  Physical Exam Constitutional:      General: He is not in acute distress.    Appearance: He is not toxic-appearing.  HENT:     Head: Normocephalic.  Eyes:     Conjunctiva/sclera: Conjunctivae normal.  Cardiovascular:     Rate and Rhythm: Normal rate and regular rhythm.     Heart sounds: Normal heart sounds.     Comments: Trace edema bilateral LE Pulmonary:     Effort: Pulmonary effort is normal. No respiratory distress.     Breath  sounds: Normal breath sounds.  Abdominal:     General: Bowel sounds are normal. There is distension (Mildly distended).     Tenderness: There is no abdominal tenderness. There is no guarding.  Musculoskeletal:     Comments: Left AV fistula: Palpable thrill.  The surrounding skin is nonerythematous  Skin:    General: Skin is warm.     Comments: No rash on exposed skin. Right tunneled catheter in  place.  No pus or drainage seen at the exit site.  The surrounding skin is nonerythematous and nontender to palpation. A small area of black gangrene seen in the left index finger.  No active drainage or pus discharge.  Mild erythema and edema of the fingertip.  Neurological:     Mental Status: He is alert and oriented to person, place, and time.  Psychiatric:        Mood and Affect: Mood normal.        Behavior: Behavior normal.    Lab Results Lab Results  Component Value Date   WBC 7.3 12/30/2020   HGB 9.6 (L) 12/30/2020   HCT 29.1 (L) 12/30/2020   MCV 91.8 12/30/2020   PLT 183 12/30/2020    Lab Results  Component Value Date   CREATININE 5.56 (H) 12/29/2020   BUN 38 (H) 12/29/2020   NA 133 (L) 12/29/2020   K 4.7 12/29/2020   CL 96 (L) 12/29/2020   CO2 29 12/29/2020    Lab Results  Component Value Date   ALT 120 (H) 12/29/2020   AST 111 (H) 12/29/2020   ALKPHOS 72 12/29/2020   BILITOT 0.6 12/29/2020     Microbiology: Recent Results (from the past 240 hour(s))  Blood Culture (routine x 2)     Status: None   Collection Time: 12/24/20 10:13 AM   Specimen: BLOOD RIGHT WRIST  Result Value Ref Range Status   Specimen Description BLOOD RIGHT WRIST  Final   Special Requests   Final    BOTTLES DRAWN AEROBIC AND ANAEROBIC Blood Culture adequate volume   Culture   Final    NO GROWTH 5 DAYS Performed at [facility], [address], [place], [place] [address]    Report Status 12/29/2020 FINAL  Final  Blood Culture (routine x 2)     Status: None   Collection Time: 12/24/20 10:14  AM   Specimen: Right Antecubital; Blood  Result Value Ref Range Status   Specimen Description RIGHT ANTECUBITAL  Final   Special Requests   Final    BOTTLES DRAWN AEROBIC AND ANAEROBIC Blood Culture adequate volume   Culture   Final    NO GROWTH 5 DAYS Performed at [facility], [address] [place],  [address]    Report Status 12/29/2020 FINAL  Final  Resp Panel by RT-PCR (Flu A&B, Covid) Nasopharyngeal Swab     Status: None   Collection Time: 12/24/20 11:08 AM   Specimen: Nasopharyngeal Swab; Nasopharyngeal(NP) swabs in vial transport medium  Result Value Ref Range Status   SARS Coronavirus 2 by RT PCR NEGATIVE NEGATIVE Final    Comment: (NOTE) SARS-CoV-2 target nucleic acids are NOT DETECTED.  The SARS-CoV-2 RNA is generally detectable in upper respiratory specimens during the acute phase of infection. The lowest concentration of SARS-CoV-2 viral copies this assay can detect is 138 copies/mL. A negative result does not preclude SARS-Cov-2 infection and should not be used as the sole basis for treatment or other patient management decisions. A negative result may occur with  improper specimen collection/handling, submission of specimen other than nasopharyngeal swab, presence of viral mutation(s) within the areas targeted by this assay, and inadequate number of viral copies(<138 copies/mL). A negative result must be combined with clinical observations, patient history, and epidemiological information. The expected result is Negative.  Fact Sheet for Patients:  EntrepreneurPulse.com.au  Fact Sheet for Healthcare Providers:  IncredibleEmployment.be  This test is no t yet approved or cleared by the [place] FDA  and  has been authorized for detection and/or diagnosis of SARS-CoV-2 by FDA under an Emergency Use Authorization (EUA). This EUA will remain  in effect (meaning this test can be used) for the duration of the COVID-19  declaration under Section 564(b)(1) of the Act, 21 U.S.C.section 360bbb-3(b)(1), unless the authorization is terminated  or revoked sooner.       Influenza A by PCR NEGATIVE NEGATIVE Final   Influenza B by PCR NEGATIVE NEGATIVE Final    Comment: (NOTE) The Xpert Xpress SARS-CoV-2/FLU/RSV plus assay is intended as an aid in the diagnosis of influenza from Nasopharyngeal swab specimens and should not be used as a sole basis for treatment. Nasal washings and aspirates are unacceptable for Xpert Xpress SARS-CoV-2/FLU/RSV testing.  Fact Sheet for Patients: EntrepreneurPulse.com.au  Fact Sheet for Healthcare Providers: IncredibleEmployment.be  This test is not yet approved or cleared by the [place] FDA and has been authorized for detection and/or diagnosis of SARS-CoV-2 by FDA under an Emergency Use Authorization (EUA). This EUA will remain in effect (meaning this test can be used) for the duration of the COVID-19 declaration under Section 564(b)(1) of the Act, 21 U.S.C. section 360bbb-3(b)(1), unless the authorization is terminated or revoked.  Performed at [facility], [address] [place], [place] [address]   Culture, blood (routine x 2)     Status: None (Preliminary result)   Collection Time: 12/26/20  1:52 PM   Specimen: BLOOD  Result Value Ref Range Status   Specimen Description BLOOD BLOOD LEFT FOREARM  Final   Special Requests   Final    BOTTLES DRAWN AEROBIC AND ANAEROBIC Blood Culture adequate volume   Culture   Final    NO GROWTH 4 DAYS Performed at [facility], [place] [address] [place], [place] [address]    Report Status PENDING  Incomplete  Culture, blood (routine x 2)     Status: None (Preliminary result)   Collection Time: 12/26/20  3:26 PM   Specimen: BLOOD  Result Value Ref Range Status   Specimen Description BLOOD RIGHT ANTECUBITAL  Final   Special Requests   Final    BOTTLES DRAWN AEROBIC ONLY Blood  Culture adequate volume   Culture   Final    NO GROWTH 4 DAYS Performed at [facility], [place] [address], [place], [place] [address]    Report Status PENDING  Incomplete  Surgical pcr screen     Status: None   Collection Time: 12/29/20 12:35 PM   Specimen: Nasal Mucosa; Nasal Swab  Result Value Ref Range Status   MRSA, PCR NEGATIVE NEGATIVE Final   Staphylococcus aureus NEGATIVE NEGATIVE Final    Comment: (NOTE) The Xpert SA Assay (FDA approved for NASAL specimens in patients 54 years of age and older), is one component of a comprehensive surveillance program. It is not intended to diagnose infection nor to guide or monitor treatment. Performed at [facility], [place] [address] [place], [place] [address]     Gaylan Gerold, [facility] [phone] pager    12/30/2020, 4:12 PM

## 2020-12-31 ENCOUNTER — Encounter (HOSPITAL_COMMUNITY): Payer: Self-pay | Admitting: Vascular Surgery

## 2020-12-31 DIAGNOSIS — L089 Local infection of the skin and subcutaneous tissue, unspecified: Secondary | ICD-10-CM | POA: Diagnosis not present

## 2020-12-31 DIAGNOSIS — I10 Essential (primary) hypertension: Secondary | ICD-10-CM | POA: Diagnosis not present

## 2020-12-31 DIAGNOSIS — E669 Obesity, unspecified: Secondary | ICD-10-CM | POA: Diagnosis not present

## 2020-12-31 DIAGNOSIS — Z9889 Other specified postprocedural states: Secondary | ICD-10-CM

## 2020-12-31 LAB — CBC WITH DIFFERENTIAL/PLATELET
Abs Immature Granulocytes: 0.27 10*3/uL — ABNORMAL HIGH (ref 0.00–0.07)
Basophils Absolute: 0 10*3/uL (ref 0.0–0.1)
Basophils Relative: 0 %
Eosinophils Absolute: 0 10*3/uL (ref 0.0–0.5)
Eosinophils Relative: 0 %
HCT: 31 % — ABNORMAL LOW (ref 39.0–52.0)
Hemoglobin: 10.4 g/dL — ABNORMAL LOW (ref 13.0–17.0)
Immature Granulocytes: 2 %
Lymphocytes Relative: 9 %
Lymphs Abs: 1 10*3/uL (ref 0.7–4.0)
MCH: 30.2 pg (ref 26.0–34.0)
MCHC: 33.5 g/dL (ref 30.0–36.0)
MCV: 90.1 fL (ref 80.0–100.0)
Monocytes Absolute: 0.5 10*3/uL (ref 0.1–1.0)
Monocytes Relative: 5 %
Neutro Abs: 9.4 10*3/uL — ABNORMAL HIGH (ref 1.7–7.7)
Neutrophils Relative %: 84 %
Platelets: 207 10*3/uL (ref 150–400)
RBC: 3.44 MIL/uL — ABNORMAL LOW (ref 4.22–5.81)
RDW: 15.2 % (ref 11.5–15.5)
WBC: 11.2 10*3/uL — ABNORMAL HIGH (ref 4.0–10.5)
nRBC: 0 % (ref 0.0–0.2)

## 2020-12-31 LAB — CULTURE, BLOOD (ROUTINE X 2)
Culture: NO GROWTH
Culture: NO GROWTH
Special Requests: ADEQUATE
Special Requests: ADEQUATE

## 2020-12-31 LAB — GLUCOSE, CAPILLARY
Glucose-Capillary: 145 mg/dL — ABNORMAL HIGH (ref 70–99)
Glucose-Capillary: 148 mg/dL — ABNORMAL HIGH (ref 70–99)
Glucose-Capillary: 169 mg/dL — ABNORMAL HIGH (ref 70–99)
Glucose-Capillary: 208 mg/dL — ABNORMAL HIGH (ref 70–99)
Glucose-Capillary: 229 mg/dL — ABNORMAL HIGH (ref 70–99)
Glucose-Capillary: 296 mg/dL — ABNORMAL HIGH (ref 70–99)
Glucose-Capillary: 304 mg/dL — ABNORMAL HIGH (ref 70–99)
Glucose-Capillary: 310 mg/dL — ABNORMAL HIGH (ref 70–99)

## 2020-12-31 LAB — PHOSPHORUS: Phosphorus: 2.2 mg/dL — ABNORMAL LOW (ref 2.5–4.6)

## 2020-12-31 LAB — COMPREHENSIVE METABOLIC PANEL
ALT: 70 U/L — ABNORMAL HIGH (ref 0–44)
AST: 74 U/L — ABNORMAL HIGH (ref 15–41)
Albumin: 2.9 g/dL — ABNORMAL LOW (ref 3.5–5.0)
Alkaline Phosphatase: 57 U/L (ref 38–126)
Anion gap: 11 (ref 5–15)
BUN: 24 mg/dL — ABNORMAL HIGH (ref 6–20)
CO2: 29 mmol/L (ref 22–32)
Calcium: 7.8 mg/dL — ABNORMAL LOW (ref 8.9–10.3)
Chloride: 92 mmol/L — ABNORMAL LOW (ref 98–111)
Creatinine, Ser: 3.89 mg/dL — ABNORMAL HIGH (ref 0.61–1.24)
GFR, Estimated: 18 mL/min — ABNORMAL LOW (ref >60)
Glucose, Bld: 145 mg/dL — ABNORMAL HIGH (ref 70–99)
Potassium: 4 mmol/L (ref 3.5–5.1)
Sodium: 132 mmol/L — ABNORMAL LOW (ref 135–145)
Total Bilirubin: 0.3 mg/dL (ref 0.3–1.2)
Total Protein: 6.3 g/dL — ABNORMAL LOW (ref 6.5–8.1)

## 2020-12-31 LAB — HEPATITIS B SURFACE ANTIBODY,QUALITATIVE: Hep B S Ab: REACTIVE — AB

## 2020-12-31 LAB — MAGNESIUM: Magnesium: 2.2 mg/dL (ref 1.7–2.4)

## 2020-12-31 MED ORDER — LIDOCAINE-PRILOCAINE 2.5-2.5 % EX CREA: 1.0000 "application " | TOPICAL_CREAM | CUTANEOUS | Status: DC | PRN

## 2020-12-31 MED ORDER — ALTEPLASE 2 MG IJ SOLR
2.0000 mg | Freq: Once | INTRAMUSCULAR | Status: DC | PRN
Start: 2020-12-31 — End: 2021-01-01

## 2020-12-31 MED ORDER — SODIUM CHLORIDE 0.9 % IV SOLN
2.0000 g | Freq: Once | INTRAVENOUS | Status: AC
  Administered 2020-12-31: 2 g via INTRAVENOUS
  Filled 2020-12-31: qty 2

## 2020-12-31 MED ORDER — HEPARIN SODIUM (PORCINE) 1000 UNIT/ML DIALYSIS: 1000.0000 [IU] | INTRAMUSCULAR | Status: DC | PRN

## 2020-12-31 MED ORDER — SODIUM PHOSPHATES 45 MMOLE/15ML IV SOLN
15.0000 mmol | Freq: Once | INTRAVENOUS | Status: AC
  Administered 2020-12-31: 15 mmol via INTRAVENOUS
  Filled 2020-12-31: qty 5

## 2020-12-31 MED ORDER — LIDOCAINE HCL (PF) 1 % IJ SOLN: 5.0000 mL | INTRAMUSCULAR | Status: DC | PRN

## 2020-12-31 MED ORDER — POTASSIUM PHOSPHATES 15 MMOLE/5ML IV SOLN: 15.0000 mmol | Freq: Once | INTRAVENOUS | Status: DC

## 2020-12-31 MED ORDER — PENTAFLUOROPROP-TETRAFLUOROETH EX AERO: 1.0000 "application " | INHALATION_SPRAY | CUTANEOUS | Status: DC | PRN

## 2020-12-31 MED ORDER — SODIUM CHLORIDE 0.9 % IV SOLN: 100.0000 mL | INTRAVENOUS | Status: DC | PRN

## 2020-12-31 NOTE — Progress Notes (Signed)
Nutrition Follow-up  DOCUMENTATION CODES:   Obesity unspecified  INTERVENTION:   - Nepro Shake po BID, each supplement provides 425 kcal and 19 grams protein  - Renal MVI with minerals daily  - Encourage PO intake  NUTRITION DIAGNOSIS:   Increased nutrient needs related to wound healing as evidenced by estimated needs.  Progressing  GOAL:   Patient will meet greater than or equal to 90% of their needs  Progressing  MONITOR:   PO intake, Supplement acceptance, Diet advancement, Labs, Weight trends, Skin, I & O's  REASON FOR ASSESSMENT:   Malnutrition Screening Tool    ASSESSMENT:   50 year old male with PMH of essential HTN, HLD, OA, gout, anemia, T2DM, diabetic nephropathy, ESRD on HD, s/p right sided tunneled cath, left AV fistula, chronic systolic CHF. Pt admitted with L finger infection.  9/02 - s/p conscious sedation, US-guided access to the right common femoral artery,  arch aortogram, selective catheterization of the left brachial artery with left upper extremity runoff, mynx closure right common femoral artery 9/07 - s/p LUE DRIL  Pt's last HD was on 9/07 with 1500 ml net UF. Post-HD weight was 104.5 kg. Noted pt with infection from [facility] which ID recommends removing.  Pt reports that he does not have much of an appetite but still eats well because he knows that he needs to. Pt states that he typically eats 3 meals daily at home. Pt denies any recent dry weight loss and reports that his new EDW is 100.5 kg. Pt states that previously his EDW at dialysis was 99.5 kg but this was increased as there was concern that they were pulling off too much fluid.  Pt has been consuming Nepro supplements and is willing to continue consuming these. RD provided pt with Nepro at time of visit. Discussed increased kcal and protein needs related to healing and infection. Pt expresses understanding.  Current weight: 101.8 kg Admission weight: 99.6 kg EDW: 99.5 kg  Pt with  non-pitting edema to BUE and BLE.  Meal Completion: 100% x last 8 documented meals  Medications reviewed and include: cholecalciferol, Nepro BID, folic acid, SSI q 4 hours, semglee 20 units daily, rena-vit, velphoro 1000 mg TID, vitamin B-12, IV abx, IV potassium phosphate 15 mmol once  Labs reviewed: sodium 132, phosphorus 2.2, elevated LFTs CBG's: 145-261 x 24 hours  I/O's: -1.5 L since admit  NUTRITION - FOCUSED PHYSICAL EXAM:  Flowsheet Row Most Recent Value  Orbital Region No depletion  Upper Arm Region No depletion  Thoracic and Lumbar Region No depletion  Buccal Region No depletion  Temple Region No depletion  Clavicle Bone Region No depletion  Clavicle and Acromion Bone Region No depletion  Scapular Bone Region No depletion  Dorsal Hand No depletion  Patellar Region No depletion  Anterior Thigh Region No depletion  Posterior Calf Region No depletion  Edema (RD Assessment) Mild  [BUE, BLE]  Hair Reviewed  Eyes Reviewed  Mouth Reviewed  Skin Reviewed  Nails Reviewed       Diet Order:   Diet Order             Diet heart healthy/carb modified Room service appropriate? Yes; Fluid consistency: Thin  Diet effective now                   EDUCATION NEEDS:   Education needs have been addressed  Skin:  Skin Assessment: Skin Integrity Issues: Incisions: L hand finger, L arm, L thigh, R groin  Last BM:  12/28/20  Height:   Ht Readings from Last 1 Encounters:  12/31/20 '5\' 6"'$  (1.676 m)    Weight:   Wt Readings from Last 1 Encounters:  12/31/20 101.8 kg    Ideal Body Weight:  64.5 kg  BMI:  Body mass index is 36.22 kg/m.  Estimated Nutritional Needs:   Kcal:  1900-2100  Protein:  95-110 grams  Fluid:  1000 ml + UOP    Gustavus Bryant, MS, RD, LDN Inpatient Clinical Dietitian Please see AMiON for contact information.

## 2020-12-31 NOTE — Progress Notes (Signed)
PROGRESS NOTE    Kevin Meyer  ZRA:[id number] DOB: Jun 18, 1970 DOA: 12/24/2020 PCP: Raiford Simmonds., PA-C   Brief Narrative:  Plan Kevin Meyer is a 50 year old gentleman with past history of essential hypertension, hyperlipidemia, OA, gout, anemia, type 2 diabetes mellitus, diabetic nephropathy, ESRD on HD MWF s/p right-sided tunneled catheter and left AV fistula, obesity, chronic systolic CHF [facility] with complaint of fever and left hand pain.  Was diagnosed with ulcers and vascular insufficiency as well as infection of the left fingers.  Transferred to [facility] for vascular consult.  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  Patient underwent angiogram/aortogram and selective catheterization of the left brachial artery with left upper extremity runoff.  Vein mapping done.  Patient underwent DRIL procedure on 12/30/2020.  Patient did develop fever with a T-max of 101.2 with tachycardia afternoon of 12/26/2020 and met criteria for sepsis.  Per vascular surgery patient was continued on vancomycin and Keflex.  Blood cultures were drawn and remain negative.  Lactic acid normal.  Has remained afebrile until 10 PM of 12/28/2020 when he had T-max of 103.  He has once again remained afebrile since then.  Since there was no source of his intermittent high-grade fever, nephrology recommended consulting ID, ID was consulted yesterday.  We then found out yesterday late afternoon that due to patient having rigors  at dialysis, blood culture was drawn from his line on 12/23/2020 at outpatient dialysis center and that grew enterococci.  ID has ordered removal of the [facility] catheter and 1 dose of cefepime today.  Defer further management to ID and vascular surgery.  High-grade intermittent fever with elevated LFTs: I was informed by dialysis staff on 12/30/2020 that patient had hepatitis B exposure in dialysis unit on 12/26/2020 and that all the patient's are being screened for hepatitis and the labs are already ordered after nephrology/dialysis unit has consulted with ID.  Patient had rising LFTs.  LFTs are now improving.  He is ruled out of acute hepatitis panel however hepatitis B surface antibody is pending.  Further per ID.  ESRD on HD: Per nephrology.  Hypokalemia: Resolved.  Hypotension: Chronic and at baseline without symptoms.  He is on midodrine.  Labetalol discontinued.  Type 2 diabetes mellitus with nephropathy: Hemoglobin A1c 7.2.  Continue SSI.  Hyperlipidemia: Continue statin.  Class II obesity: Counseling for weight loss provided.  DVT prophylaxis: heparin injection 5,000 Units Start: 12/25/20 2200 SCDs Start: 12/24/20 1426   Code Status: Full Code  Family Communication:  None present at bedside.  Plan of care discussed with patient in length and he verbalized understanding and agreed with it.  Status is: Inpatient  Remains inpatient appropriate because:Ongoing diagnostic testing needed not appropriate for outpatient work up  Dispo: The patient is from: Home              Anticipated d/c is to: Home              Patient currently is not medically stable to  d/c.   Difficult to place patient No        Estimated body mass index is 36.22 kg/m as calculated from the following:   Height as of this encounter: _0  (1.676 m).   Weight as of this encounter: 101.8 kg.     Nutritional Assessment: Body mass index is 36.22 kg/m.Marland Kitchen Seen by dietician.  I agree with the assessment and plan as  outlined below: Nutrition Status: Nutrition Problem: Increased nutrient needs Etiology: wound healing Signs/Symptoms: estimated needs Interventions: Nepro shake, MVI  .  Skin Assessment: I have examined the patient's skin and I agree with the wound assessment as performed by the wound care RN as outlined below:    Consultants:  Vascular surgery ID  Procedures:  As above  Antimicrobials:  Anti-infectives (From admission, onward)    Start     Dose/Rate Route Frequency Ordered Stop   12/31/20 1115  ceFEPIme (MAXIPIME) 2 g in sodium chloride 0.9 % 100 mL IVPB        2 g 200 mL/hr over 30 Minutes Intravenous  Once 12/31/20 1018 12/31/20 1224   12/30/20 0755  ceFAZolin (ANCEF) 2-4 GM/100ML-% IVPB       Note to Pharmacy: Luciana Axe   : cabinet override      12/30/20 0755 12/30/20 1959   12/26/20 0745  vancomycin (VANCOCIN) IVPB 1000 mg/200 mL premix        1,000 mg 200 mL/hr over 60 Minutes Intravenous  Once 12/26/20 0736 12/26/20 1030   12/25/20 2200  cephALEXin (KEFLEX) capsule 500 mg  Status:  Discontinued        500 mg Oral Every 12 hours 12/25/20 1056 12/30/20 1639   12/25/20 1600  ceFEPIme (MAXIPIME) 2 g in sodium chloride 0.9 % 100 mL IVPB  Status:  Discontinued        2 g 200 mL/hr over 30 Minutes Intravenous Every M-W-F (Hemodialysis) 12/24/20 1233 12/25/20 1056   12/25/20 1600  vancomycin (VANCOCIN) IVPB 1000 mg/200 mL premix  Status:  Discontinued        1,000 mg 200 mL/hr over 60 Minutes Intravenous Every M-W-F (Hemodialysis) 12/24/20 1233 12/30/20 1639   12/24/20 1000  vancomycin (VANCOREADY) IVPB 2000 mg/400 mL        2,000 mg 200 mL/hr over 120 Minutes Intravenous  Once 12/24/20 0946 12/24/20 1455   12/24/20 0945  ceFEPIme (MAXIPIME) 2 g in sodium chloride 0.9 % 100 mL IVPB        2 g 200 mL/hr over 30 Minutes Intravenous  Once 12/24/20 0941 12/24/20 1133   12/24/20 0945  vancomycin (VANCOCIN) IVPB 1000 mg/200 mL premix  Status:  Discontinued        1,000  mg 200 mL/hr over 60 Minutes Intravenous  Once 12/24/20 0941 12/24/20 0946   12/24/20 0945  metroNIDAZOLE (FLAGYL) IVPB 500 mg  Status:  Discontinued        500 mg 100 mL/hr over 60 Minutes Intravenous Every 12 hours 12/24/20 0941 12/25/20 1056          Subjective: Patient seen and examined.  He states that since he had the procedure yesterday, he has numbness in the left hand and fingers is decreased however his hand is cold and his power is still 4 out of 5.  No new complaint.  No shortness of breath.  He is aware of hepatitis B exposure.  And has no questions.  Objective: Vitals:   12/31/20 0418 12/31/20 0609 12/31/20 0722 12/31/20 1104  BP: Marland Kitchen)  93/51  (!) 92/56 106/60  Pulse: 85  85 84  Resp: _0 Temp: 99.7 F (37.6 C)  99.4 F (37.4 C) 99.3 F (37.4 C)  TempSrc: Oral  Oral Oral  SpO2: 94%  95% 96%  Weight:  101.8 kg    Height:  _1  (1.676 m)      Intake/Output Summary (Last 24 hours) at 12/31/2020 1239 Last data filed at 12/31/2020 1208 Gross per 24 hour  Intake --  Output 1600 ml  Net -1600 ml    Filed Weights   12/30/20 2025 12/31/20 0010 12/31/20 0609  Weight: 106 kg 104.5 kg 101.8 kg    Examination:  General exam: Appears calm and comfortable, obese Respiratory system: Clear to auscultation. Respiratory effort normal. Cardiovascular system: S1 & S2 heard, RRR. No JVD, murmurs, rubs, gallops or clicks. No pedal edema. Gastrointestinal system: Abdomen is nondistended, soft and nontender. No organomegaly or masses felt. Normal bowel sounds heard. Central nervous system: Alert and oriented. No focal neurological deficits. Extremities: Symmetric 5 x 5 power. Skin: Ulceration of 2-3 fingers of the left hand. Psychiatry: Judgement and insight appear normal. Mood & affect appropriate.    Data Reviewed: I have personally reviewed following labs and imaging studies  CBC: Recent Labs  Lab 12/27/20 0149 12/28/20 0116 12/29/20 0248 12/30/20 0218  12/31/20 0122  WBC 11.5* 8.7 10.6* 7.3 11.2*  NEUTROABS 8.2* 6.8 8.9* 5.4 9.4*  HGB 10.7* 9.6* 10.0* 9.6* 10.4*  HCT 32.5* 29.2* 30.7* 29.1* 31.0*  MCV 91.5 91.5 91.9 91.8 90.1  PLT 133* 147* 180 183 935    Basic Metabolic Panel: Recent Labs  Lab 12/27/20 0149 12/28/20 0116 12/29/20 0248 12/30/20 0218 12/30/20 1636 12/31/20 0122  NA 132* 133* 133*  --  130* 132*  K 3.7 3.7 4.7  --  4.6 4.0  CL 93* 94* 96*  --  95* 92*  CO2 _2 --  24 29  GLUCOSE 156* 149* 241*  --  229* 145*  BUN 39* 66* 38*  --  65* 24*  CREATININE 6.13* 7.63* 5.56*  --  7.26* 3.89*  CALCIUM 8.6* 8.7* 8.8*  --  8.8* 7.8*  MG 2.1 2.4 2.1 2.3  --  2.2  PHOS 4.1 5.1* 2.2* 3.0 3.2 2.2*    GFR: Estimated Creatinine Clearance: 25.4 mL/min (A) (by C-G formula based on SCr of 3.89 mg/dL (H)). Liver Function Tests: Recent Labs  Lab 12/26/20 0717 12/27/20 0149 12/28/20 0116 12/29/20 0248 12/30/20 1636 12/31/20 0122  AST 33 84* 90* 111*  --  74*  ALT 33 77* 94* 120*  --  70*  ALKPHOS 49 63 54 72  --  57  BILITOT 0.7 0.6 0.3 0.6  --  0.3  PROT 6.3* 6.5 6.2* 6.5  --  6.3*  ALBUMIN 3.1* 3.1* 2.9* 3.0* 3.0* 2.9*    No results for input(s): LIPASE, AMYLASE in the last 168 hours. No results for input(s): AMMONIA in the last 168 hours. Coagulation Profile: No results for input(s): INR, PROTIME in the last 168 hours.  Cardiac Enzymes: No results for input(s): CKTOTAL, CKMB, CKMBINDEX, TROPONINI in the last 168 hours. BNP (last 3 results) No results for input(s): PROBNP in the last 8760 hours. HbA1C: No results for input(s): HGBA1C in the last 72 hours.  CBG: Recent Labs  Lab 12/30/20 2000 12/31/20 0029 12/31/20 0416 12/31/20 0725 12/31/20 1106  GLUCAP 215* 148* 169* 145* 208*    Lipid Profile: No results  for input(s): CHOL, HDL, LDLCALC, TRIG, CHOLHDL, LDLDIRECT in the last 72 hours. Thyroid Function Tests: No results for input(s): TSH, T4TOTAL, FREET4, T3FREE, THYROIDAB in the last 72  hours. Anemia Panel: No results for input(s): VITAMINB12, FOLATE, FERRITIN, TIBC, IRON, RETICCTPCT in the last 72 hours. Sepsis Labs: Recent Labs  Lab 12/26/20 1337  LATICACIDVEN 1.2     Recent Results (from the past 240 hour(s))  Blood Culture (routine x 2)     Status: None   Collection Time: 12/24/20 10:13 AM   Specimen: BLOOD RIGHT WRIST  Result Value Ref Range Status   Specimen Description BLOOD RIGHT WRIST  Final   Special Requests   Final    BOTTLES DRAWN AEROBIC AND ANAEROBIC Blood Culture adequate volume   Culture   Final    NO GROWTH 5 DAYS Performed at [facility], [address], [place], [place] [address]    Report Status 12/29/2020 FINAL  Final  Blood Culture (routine x 2)     Status: None   Collection Time: 12/24/20 10:14 AM   Specimen: Right Antecubital; Blood  Result Value Ref Range Status   Specimen Description RIGHT ANTECUBITAL  Final   Special Requests   Final    BOTTLES DRAWN AEROBIC AND ANAEROBIC Blood Culture adequate volume   Culture   Final    NO GROWTH 5 DAYS Performed at [facility], [address], [place], [place] [address]    Report Status 12/29/2020 FINAL  Final  Resp Panel by RT-PCR (Flu A&B, Covid) Nasopharyngeal Swab     Status: None   Collection Time: 12/24/20 11:08 AM   Specimen: Nasopharyngeal Swab; Nasopharyngeal(NP) swabs in vial transport medium  Result Value Ref Range Status   SARS Coronavirus 2 by RT PCR NEGATIVE NEGATIVE Final    Comment: (NOTE) SARS-CoV-2 target nucleic acids are NOT DETECTED.  The SARS-CoV-2 RNA is generally detectable in upper respiratory specimens during the acute phase of infection. The lowest concentration of SARS-CoV-2 viral copies this assay can detect is 138 copies/mL. A negative result does not preclude SARS-Cov-2 infection and should not be used as the sole basis for treatment or other patient management decisions. A negative result may occur with  improper specimen collection/handling, submission  of specimen other than nasopharyngeal swab, presence of viral mutation(s) within the areas targeted by this assay, and inadequate number of viral copies(<138 copies/mL). A negative result must be combined with clinical observations, patient history, and epidemiological information. The expected result is Negative.  Fact Sheet for Patients:  EntrepreneurPulse.com.au  Fact Sheet for Healthcare Providers:  IncredibleEmployment.be  This test is no t yet approved or cleared by the [place] FDA and  has been authorized for detection and/or diagnosis of SARS-CoV-2 by FDA under an Emergency Use Authorization (EUA). This EUA will remain  in effect (meaning this test can be used) for the duration of the COVID-19 declaration under Section 564(b)(1) of the Act, 21 U.S.C.section 360bbb-3(b)(1), unless the authorization is terminated  or revoked sooner.       Influenza A by PCR NEGATIVE NEGATIVE Final   Influenza B by PCR NEGATIVE NEGATIVE Final    Comment: (NOTE) The Xpert Xpress SARS-CoV-2/FLU/RSV plus assay is intended as an aid in the diagnosis of influenza from Nasopharyngeal swab specimens and should not be used as a sole basis for treatment. Nasal washings and aspirates are unacceptable for Xpert Xpress SARS-CoV-2/FLU/RSV testing.  Fact Sheet for Patients: EntrepreneurPulse.com.au  Fact Sheet for Healthcare Providers: IncredibleEmployment.be  This test is not  yet approved or cleared by the [place] and has been authorized for detection and/or diagnosis of SARS-CoV-2 by FDA under an Emergency Use Authorization (EUA). This EUA will remain in effect (meaning this test can be used) for the duration of the COVID-19 declaration under Section 564(b)(1) of the Act, 21 U.S.C. section 360bbb-3(b)(1), unless the authorization is terminated or revoked.  Performed at [facility], [address]  [place], [place] [address]   Culture, blood (routine x 2)     Status: None   Collection Time: 12/26/20  1:52 PM   Specimen: BLOOD  Result Value Ref Range Status   Specimen Description BLOOD BLOOD LEFT FOREARM  Final   Special Requests   Final    BOTTLES DRAWN AEROBIC AND ANAEROBIC Blood Culture adequate volume   Culture   Final    NO GROWTH 5 DAYS Performed at [facility], [place] [address] [place], [place] [address]    Report Status 12/31/2020 FINAL  Final  Culture, blood (routine x 2)     Status: None   Collection Time: 12/26/20  3:26 PM   Specimen: BLOOD  Result Value Ref Range Status   Specimen Description BLOOD RIGHT ANTECUBITAL  Final   Special Requests   Final    BOTTLES DRAWN AEROBIC ONLY Blood Culture adequate volume   Culture   Final    NO GROWTH 5 DAYS Performed at [facility], [place] [address], [place], [place] [address]    Report Status 12/31/2020 FINAL  Final  Surgical pcr screen     Status: None   Collection Time: 12/29/20 12:35 PM   Specimen: Nasal Mucosa; Nasal Swab  Result Value Ref Range Status   MRSA, PCR NEGATIVE NEGATIVE Final   Staphylococcus aureus NEGATIVE NEGATIVE Final    Comment: (NOTE) The Xpert SA Assay (FDA approved for NASAL specimens in patients 3 years of age and older), is one component of a comprehensive surveillance program. It is not intended to diagnose infection nor to guide or monitor treatment. Performed at [facility], [place] [address], [place], [place] [address]        Radiology Studies: No results found.  Scheduled Meds:  allopurinol  100 mg Oral Daily   aspirin EC  81 mg Oral Daily   atorvastatin  40 mg Oral QHS   Chlorhexidine Gluconate Cloth  6 each Topical Q0600   cholecalciferol  2,000 Units Oral Daily   feeding supplement (NEPRO CARB STEADY)  237 mL Oral BID BM   folic acid  1 mg Oral Daily   heparin  5,000 Units Subcutaneous Q8H   insulin aspart  0-6 Units Subcutaneous Q4H   insulin glargine-yfgn   20 Units Subcutaneous Daily   midodrine  5 mg Oral Once per day on Mon Wed Fri   multivitamin  1 tablet Oral QHS   sodium chloride flush  3 mL Intravenous Q12H   sucroferric oxyhydroxide  1,000 mg Oral TID WC   cyanocobalamin  1,000 mcg Oral Daily   Continuous Infusions:  sodium chloride     sodium phosphate  Dextrose 5% IVPB       LOS: 7 days   Time spent: 27 minutes   Darliss Cheney, MD [facility]  12/31/2020, 12:39 PM   How to contact the [facility] Attending or Consulting provider [place] or covering provider during after hours [place], for this patient?  Check the care team in [facility] and look for a) attending/consulting TRH  provider listed and b) the [facility] team listed. Page or secure chat [phone]. Log into [email or web address] and use [place]'s universal password to access. If you do not have the password, please contact the hospital operator. Locate the [facility] provider you are looking for under [facility] and page to a number that you can be directly reached. If you still have difficulty reaching the provider, please page the [facility] (Director on Call) for the Hospitalists listed on amion for assistance.

## 2020-12-31 NOTE — Progress Notes (Addendum)
Progress Note    12/31/2020 7:57 AM 1 Day Post-Op  Subjective:  OOB to chair. Still with left hand pain, numbness and coolness.   Vitals:   12/31/20 0418 12/31/20 0722  BP: (!) 93/51 (!) 92/56  Pulse: 85 85  Resp: 20 19  Temp: 99.7 F (37.6 C) 99.4 F (37.4 C)  SpO2: 94% 95%    Physical Exam:  General appearance: awake, alert in NAD Respirations: unlabored; no dyspnea at rest Left upper extremity: Hand is slightly cool with 4/5 grip strength. Sensation intact. Good bruit and thrill in fistula. Brisk radial, ulnar and palmar arch Doppler signals. Ischemic changes to digits stable. Incision(s): dressings dry and intact  CBC    Component Value Date/Time   WBC 11.2 (H) 12/31/2020 0122   RBC 3.44 (L) 12/31/2020 0122   HGB 10.4 (L) 12/31/2020 0122   HCT 31.0 (L) 12/31/2020 0122   PLT 207 12/31/2020 0122   MCV 90.1 12/31/2020 0122   MCH 30.2 12/31/2020 0122   MCHC 33.5 12/31/2020 0122   RDW 15.2 12/31/2020 0122   LYMPHSABS 1.0 12/31/2020 0122   MONOABS 0.5 12/31/2020 0122   EOSABS 0.0 12/31/2020 0122   BASOSABS 0.0 12/31/2020 0122    BMET    Component Value Date/Time   NA 130 (L) 12/30/2020 1636   K 4.6 12/30/2020 1636   CL 95 (L) 12/30/2020 1636   CO2 24 12/30/2020 1636   GLUCOSE 229 (H) 12/30/2020 1636   BUN 65 (H) 12/30/2020 1636   CREATININE 7.26 (H) 12/30/2020 1636   CALCIUM 8.8 (L) 12/30/2020 1636   GFRNONAA 8 (L) 12/30/2020 1636   GFRAA 50 (L) 09/10/2017 0449     Intake/Output Summary (Last 24 hours) at 12/31/2020 0757 Last data filed at 12/31/2020 0010 Gross per 24 hour  Intake 350 ml  Output 1540 ml  Net -1190 ml    HOSPITAL MEDICATIONS Scheduled Meds:  allopurinol  100 mg Oral Daily   aspirin EC  81 mg Oral Daily   atorvastatin  40 mg Oral QHS   Chlorhexidine Gluconate Cloth  6 each Topical Q0600   cholecalciferol  2,000 Units Oral Daily   feeding supplement (NEPRO CARB STEADY)  237 mL Oral BID BM   folic acid  1 mg Oral Daily   heparin   5,000 Units Subcutaneous Q8H   insulin aspart  0-6 Units Subcutaneous Q4H   insulin glargine-yfgn  20 Units Subcutaneous Daily   midodrine  5 mg Oral Once per day on Mon Wed Fri   multivitamin  1 tablet Oral QHS   sodium chloride flush  3 mL Intravenous Q12H   sucroferric oxyhydroxide  1,000 mg Oral TID WC   cyanocobalamin  1,000 mcg Oral Daily   Continuous Infusions:  sodium chloride     PRN Meds:.sodium chloride, acetaminophen, hydrALAZINE, ondansetron **OR** ondansetron (ZOFRAN) IV, ondansetron (ZOFRAN) IV, sodium chloride flush, sorbitol  Assessment and Plan: POD 1 LUE DRIL procedure for steal syndrome. Symptoms persist.   -DVT prophylaxis:  Heparin    Risa Grill, PA-C [facility] [phone] 12/31/2020  7:57 AM   VASCULAR STAFF ADDENDUM: I have independently interviewed and examined the patient. I agree with the above.  Doing well POD#1 s/p LUE DRIL. Hand feels a bit better later this morning. Good doppler flow. Dressings clean.   Plan tunneled dialysis catheter removal after dialysis tomorrow. Will give a line holiday over the weekend replace the catheter Monday.   Yevonne Aline. Stanford Breed, MD [facility]  [facility] Phone Number: [phone] 12/31/2020 12:55 PM

## 2020-12-31 NOTE — Progress Notes (Addendum)
Round on patient today for service recovery opportunity secondary to possible exposure incident. Patient is found up in the chair with permission granted to have conversation. Patient reports understanding of event and why labs and vaccine were given. Patient with no concerns from the incident at this time. Patient with questions concerning his discharge plans and whether he willl leave with his HD catheter in place. He voices concern for his L arm fistula being to sore to stick at this time, but is also concerned that he has an infection steing from his current catheter and feels he is not clear on what the plan will be. Will forward this concern to nephrology for follow-up. No  other questions at this time  Dorthey Sawyer, RN  Dialysis Nurse Coordinator [phone]

## 2020-12-31 NOTE — Progress Notes (Signed)
[facility] Progress Note   Subjective:    Seen and examined patient at bedside. S/P LUE DRIL. No complaints at this time. Tolerated yesterday's HD after procedure with net UF 1.5L. He denies N/V or fevers during yesterday's HD.   Objective Vitals:   12/31/20 0418 12/31/20 0609 12/31/20 0722 12/31/20 1104  BP: (!) 93/51  (!) 92/56 106/60  Pulse: 85  85 84  Resp: '20  19 18  '$ Temp: 99.7 F (37.6 C)  99.4 F (37.4 C) 99.3 F (37.4 C)  TempSrc: Oral  Oral Oral  SpO2: 94%  95% 96%  Weight:  101.8 kg    Height:  '5\' 6"'$  (1.676 m)     Physical Exam General: Well-appearing male; NAD; on RA Heart: S1 and S2; No murmurs, gallops, or rubs Lungs: Clear throughout, no wheezing, rales, or rhonchi Abdomen: Soft and non-tender Extremities: No edema BLLE Dialysis Access: R TDC; L AVF (+) bruit/thrill: dressing clean, dry, and intact; ischemic ulcers L 2nd/5th fingers: noted mild swelling of L arm and L hand. L hand cool to touch   Filed Weights   12/30/20 2025 12/31/20 0010 12/31/20 0609  Weight: 106 kg 104.5 kg 101.8 kg    Intake/Output Summary (Last 24 hours) at 12/31/2020 1308 Last data filed at 12/31/2020 1208 Gross per 24 hour  Intake --  Output 1600 ml  Net -1600 ml    Additional Objective Labs: Basic Metabolic Panel: Recent Labs  Lab 12/29/20 0248 12/30/20 0218 12/30/20 1636 12/31/20 0122  NA 133*  --  130* 132*  K 4.7  --  4.6 4.0  CL 96*  --  95* 92*  CO2 29  --  24 29  GLUCOSE 241*  --  229* 145*  BUN 38*  --  65* 24*  CREATININE 5.56*  --  7.26* 3.89*  CALCIUM 8.8*  --  8.8* 7.8*  PHOS 2.2* 3.0 3.2 2.2*   Liver Function Tests: Recent Labs  Lab 12/28/20 0116 12/29/20 0248 12/30/20 1636 12/31/20 0122  AST 90* 111*  --  74*  ALT 94* 120*  --  70*  ALKPHOS 54 72  --  57  BILITOT 0.3 0.6  --  0.3  PROT 6.2* 6.5  --  6.3*  ALBUMIN 2.9* 3.0* 3.0* 2.9*   No results for input(s): LIPASE, AMYLASE in the last 168 hours. CBC: Recent Labs  Lab  12/27/20 0149 12/28/20 0116 12/29/20 0248 12/30/20 0218 12/31/20 0122  WBC 11.5* 8.7 10.6* 7.3 11.2*  NEUTROABS 8.2* 6.8 8.9* 5.4 9.4*  HGB 10.7* 9.6* 10.0* 9.6* 10.4*  HCT 32.5* 29.2* 30.7* 29.1* 31.0*  MCV 91.5 91.5 91.9 91.8 90.1  PLT 133* 147* 180 183 207   Blood Culture    Component Value Date/Time   SDES BLOOD RIGHT ANTECUBITAL 12/26/2020 1526   SPECREQUEST  12/26/2020 1526    BOTTLES DRAWN AEROBIC ONLY Blood Culture adequate volume   CULT  12/26/2020 1526    NO GROWTH 5 DAYS Performed at [facility], [place] [address] [place], [place] [address]    REPTSTATUS 12/31/2020 FINAL 12/26/2020 1526    Cardiac Enzymes: No results for input(s): CKTOTAL, CKMB, CKMBINDEX, TROPONINI in the last 168 hours. CBG: Recent Labs  Lab 12/30/20 2000 12/31/20 0029 12/31/20 0416 12/31/20 0725 12/31/20 1106  GLUCAP 215* 148* 169* 145* 208*   Iron Studies: No results for input(s): IRON, TIBC, TRANSFERRIN, FERRITIN in the last 72 hours. Lab Results  Component Value Date   INR 1.0 12/24/2020  INR 1.3 (H) 06/23/2020   Studies/Results: No results found.  Medications:  sodium chloride     sodium phosphate  Dextrose 5% IVPB 15 mmol (12/31/20 1302)    allopurinol  100 mg Oral Daily   aspirin EC  81 mg Oral Daily   atorvastatin  40 mg Oral QHS   Chlorhexidine Gluconate Cloth  6 each Topical Q0600   cholecalciferol  2,000 Units Oral Daily   feeding supplement (NEPRO CARB STEADY)  237 mL Oral BID BM   folic acid  1 mg Oral Daily   heparin  5,000 Units Subcutaneous Q8H   insulin aspart  0-6 Units Subcutaneous Q4H   insulin glargine-yfgn  20 Units Subcutaneous Daily   midodrine  5 mg Oral Once per day on Mon Wed Fri   multivitamin  1 tablet Oral QHS   sodium chloride flush  3 mL Intravenous Q12H   sucroferric oxyhydroxide  1,000 mg Oral TID WC   cyanocobalamin  1,000 mcg Oral Daily    Dialysis Orders: MWF at [facility] 4hr, 400/600, EDW 99.5kg, 2K/2.5Ca, AVF + TDC,  heparin 5000 - Calcitriol 0.45mg PO q HD - No ESA  Assessment/Plan: 1. 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 Discussed these findings with ID and Primary. [facility] Patient will then have a line holiday over the weekend and a new TDC will be placed on Monday 9/12.  2. 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 ESRD: Back to usual MWF schedule - Tolerated yesterday's HD with net UF 1.5L. No issues with N/V and fevers as well. Plan for HD 9/9. 4. HTN/volume: BP low/stable, follow. 5. Anemia: Hgb 10.4, no ESA for now. 6. Secondary hyperparathyroidism: Corr Ca 8.6, Phos currently low-will hold binders for now and monitor trend. 7. Nutrition: Alb low, continue protein supplements. 8. T2DM  CTobie Poet NP [facility] 12/31/2020,1:08 PM  LOS: 7 days

## 2020-12-31 NOTE — Progress Notes (Signed)
[place] for Infectious Disease  Date of Admission:  12/24/2020   Total days of antibiotics 7        Keflex day 6 > DC                                                                                    Vancomycin day 1 > DC        1 dose of Cefazolin  ASSESSMENT: Patient is doing well with HD yesterday without any fever or chills.  Vancomycin and Keflex was discontinued yesterday and patient got 1 dose of cefazolin.  I received the  report of blood culture from HD center on 8/31.  The culture was drawn from the tunnel catheter and grew Enterobacter, which confirmed line infection.  Patient currently not bacteremic.  We recommend removing the tunneled catheter and replacing a new one given he is getting [facility].  He will receive 1 dose of IV cefepime for his Enterobacter line infection.  PLAN: Recommend removing TDC 1 dose of IV cefepime today  Active Problems:   Hypertension   Type 2 diabetes with nephropathy (HCC)   Class 2 obesity due to excess calories with body mass index (BMI) of 39.0 to 39.9 in adult   Hyperlipidemia   Osteoarthritis   Diabetes mellitus type 2, controlled, with complications (HCC)   Infection of finger LEFT   Finger infection   Scheduled Meds:  allopurinol  100 mg Oral Daily   aspirin EC  81 mg Oral Daily   atorvastatin  40 mg Oral QHS   Chlorhexidine Gluconate Cloth  6 each Topical Q0600   cholecalciferol  2,000 Units Oral Daily   feeding supplement (NEPRO CARB STEADY)  237 mL Oral BID BM   folic acid  1 mg Oral Daily   heparin  5,000 Units Subcutaneous Q8H   insulin aspart  0-6 Units Subcutaneous Q4H   insulin glargine-yfgn  20 Units Subcutaneous Daily   midodrine  5 mg Oral Once per day on Mon Wed Fri   multivitamin  1 tablet Oral QHS   sodium chloride flush  3 mL Intravenous Q12H   sucroferric oxyhydroxide  1,000 mg Oral TID WC   cyanocobalamin  1,000 mcg Oral Daily   Continuous Infusions:  sodium chloride     ceFEPime  (MAXIPIME) IV     sodium phosphate  Dextrose 5% IVPB     PRN Meds:.sodium chloride, acetaminophen, hydrALAZINE, ondansetron **OR** ondansetron (ZOFRAN) IV, ondansetron (ZOFRAN) IV, sodium chloride flush, sorbitol   SUBJECTIVE: Patient sitting on reclining chair during examination.  He appears comfortable and in no acute distress.  Reports feeling well after HD yesterday.  No fever or chills.  Review of Systems: ROS Per HPI  Allergies  Allergen Reactions   Gabapentin Anaphylaxis   Lyrica [Pregabalin] Anaphylaxis    OBJECTIVE: Vitals:   12/31/20 0010 12/31/20 0418 12/31/20 0609 12/31/20 0722  BP:  (!) 93/51  (!) 92/56  Pulse:  85  85  Resp:  20  19  Temp: 98.6 F (37 C) 99.7 F (37.6 C)  99.4 F (37.4 C)  TempSrc: Oral Oral  Oral  SpO2:  94%  95%  Weight: 104.5 kg  101.8 kg   Height:   '5\' 6"'$  (1.676 m)    Body mass index is 36.22 kg/m.  Physical Exam Constitutional:      General: He is not in acute distress.    Appearance: He is not toxic-appearing.  HENT:     Head: Normocephalic.  Eyes:     Conjunctiva/sclera: Conjunctivae normal.  Cardiovascular:     Rate and Rhythm: Normal rate and regular rhythm.     Comments: Trace LE edema Pulmonary:     Effort: Pulmonary effort is normal. No respiratory distress.     Breath sounds: Normal breath sounds.  Skin:    General: Skin is warm.     Coloration: Skin is not jaundiced.     Comments: Ischemic ulceration of left index finger, no signs of active infection  Neurological:     Mental Status: He is alert and oriented to person, place, and time.  Psychiatric:        Mood and Affect: Mood normal.        Behavior: Behavior normal.    Lab Results Lab Results  Component Value Date   WBC 11.2 (H) 12/31/2020   HGB 10.4 (L) 12/31/2020   HCT 31.0 (L) 12/31/2020   MCV 90.1 12/31/2020   PLT 207 12/31/2020    Lab Results  Component Value Date   CREATININE 3.89 (H) 12/31/2020   BUN 24 (H) 12/31/2020   NA 132 (L)  12/31/2020   K 4.0 12/31/2020   CL 92 (L) 12/31/2020   CO2 29 12/31/2020    Lab Results  Component Value Date   ALT 70 (H) 12/31/2020   AST 74 (H) 12/31/2020   ALKPHOS 57 12/31/2020   BILITOT 0.3 12/31/2020     Microbiology: Recent Results (from the past 240 hour(s))  Blood Culture (routine x 2)     Status: None   Collection Time: 12/24/20 10:13 AM   Specimen: BLOOD RIGHT WRIST  Result Value Ref Range Status   Specimen Description BLOOD RIGHT WRIST  Final   Special Requests   Final    BOTTLES DRAWN AEROBIC AND ANAEROBIC Blood Culture adequate volume   Culture   Final    NO GROWTH 5 DAYS Performed at [facility], [address], [place], [place] [address]    Report Status 12/29/2020 FINAL  Final  Blood Culture (routine x 2)     Status: None   Collection Time: 12/24/20 10:14 AM   Specimen: Right Antecubital; Blood  Result Value Ref Range Status   Specimen Description RIGHT ANTECUBITAL  Final   Special Requests   Final    BOTTLES DRAWN AEROBIC AND ANAEROBIC Blood Culture adequate volume   Culture   Final    NO GROWTH 5 DAYS Performed at [facility], [address], [place],  [address]    Report Status 12/29/2020 FINAL  Final  Resp Panel by RT-PCR (Flu A&B, Covid) Nasopharyngeal Swab     Status: None   Collection Time: 12/24/20 11:08 AM   Specimen: Nasopharyngeal Swab; Nasopharyngeal(NP) swabs in vial transport medium  Result Value Ref Range Status   SARS Coronavirus 2 by RT PCR NEGATIVE NEGATIVE Final    Comment: (NOTE) SARS-CoV-2 target nucleic acids are NOT DETECTED.  The SARS-CoV-2 RNA is generally detectable in upper respiratory specimens during the acute phase of infection. The lowest concentration of SARS-CoV-2 viral copies this assay can detect is 138 copies/mL. A negative result does  not preclude SARS-Cov-2 infection and should not be used as the sole basis for treatment or other patient management decisions. A negative result may occur with  improper  specimen collection/handling, submission of specimen other than nasopharyngeal swab, presence of viral mutation(s) within the areas targeted by this assay, and inadequate number of viral copies(<138 copies/mL). A negative result must be combined with clinical observations, patient history, and epidemiological information. The expected result is Negative.  Fact Sheet for Patients:  EntrepreneurPulse.com.au  Fact Sheet for Healthcare Providers:  IncredibleEmployment.be  This test is no t yet approved or cleared by the [place] FDA and  has been authorized for detection and/or diagnosis of SARS-CoV-2 by FDA under an Emergency Use Authorization (EUA). This EUA will remain  in effect (meaning this test can be used) for the duration of the COVID-19 declaration under Section 564(b)(1) of the Act, 21 U.S.C.section 360bbb-3(b)(1), unless the authorization is terminated  or revoked sooner.       Influenza A by PCR NEGATIVE NEGATIVE Final   Influenza B by PCR NEGATIVE NEGATIVE Final    Comment: (NOTE) The Xpert Xpress SARS-CoV-2/FLU/RSV plus assay is intended as an aid in the diagnosis of influenza from Nasopharyngeal swab specimens and should not be used as a sole basis for treatment. Nasal washings and aspirates are unacceptable for Xpert Xpress SARS-CoV-2/FLU/RSV testing.  Fact Sheet for Patients: EntrepreneurPulse.com.au  Fact Sheet for Healthcare Providers: IncredibleEmployment.be  This test is not yet approved or cleared by the [place] FDA and has been authorized for detection and/or diagnosis of SARS-CoV-2 by FDA under an Emergency Use Authorization (EUA). This EUA will remain in effect (meaning this test can be used) for the duration of the COVID-19 declaration under Section 564(b)(1) of the Act, 21 U.S.C. section 360bbb-3(b)(1), unless the authorization is terminated or revoked.  Performed at  [facility], [address] [place], [place] [address]   Culture, blood (routine x 2)     Status: None   Collection Time: 12/26/20  1:52 PM   Specimen: BLOOD  Result Value Ref Range Status   Specimen Description BLOOD BLOOD LEFT FOREARM  Final   Special Requests   Final    BOTTLES DRAWN AEROBIC AND ANAEROBIC Blood Culture adequate volume   Culture   Final    NO GROWTH 5 DAYS Performed at [facility], [place] [address] [place], [place] [address]    Report Status 12/31/2020 FINAL  Final  Culture, blood (routine x 2)     Status: None   Collection Time: 12/26/20  3:26 PM   Specimen: BLOOD  Result Value Ref Range Status   Specimen Description BLOOD RIGHT ANTECUBITAL  Final   Special Requests   Final    BOTTLES DRAWN AEROBIC ONLY Blood Culture adequate volume   Culture   Final    NO GROWTH 5 DAYS Performed at [facility], [place] [address] [place],  [address]    Report Status 12/31/2020 FINAL  Final  Surgical pcr screen     Status: None   Collection Time: 12/29/20 12:35 PM   Specimen: Nasal Mucosa; Nasal Swab  Result Value Ref Range Status   MRSA, PCR NEGATIVE NEGATIVE Final   Staphylococcus aureus NEGATIVE NEGATIVE Final    Comment: (NOTE) The Xpert SA Assay (FDA approved for NASAL specimens in patients 34 years of age and older), is one component of a comprehensive surveillance program. It is not intended to diagnose infection nor to guide or monitor treatment. Performed  at [facility], [place] [address], [place], [place] [address]     Gaylan Gerold, [facility] [phone] pager    12/31/2020, 10:37 AM

## 2021-01-01 DIAGNOSIS — L089 Local infection of the skin and subcutaneous tissue, unspecified: Secondary | ICD-10-CM | POA: Diagnosis not present

## 2021-01-01 DIAGNOSIS — E785 Hyperlipidemia, unspecified: Secondary | ICD-10-CM

## 2021-01-01 DIAGNOSIS — I10 Essential (primary) hypertension: Secondary | ICD-10-CM | POA: Diagnosis not present

## 2021-01-01 DIAGNOSIS — E118 Type 2 diabetes mellitus with unspecified complications: Secondary | ICD-10-CM | POA: Diagnosis not present

## 2021-01-01 DIAGNOSIS — E669 Obesity, unspecified: Secondary | ICD-10-CM | POA: Diagnosis not present

## 2021-01-01 LAB — CBC WITH DIFFERENTIAL/PLATELET
Abs Immature Granulocytes: 0.2 10*3/uL — ABNORMAL HIGH (ref 0.00–0.07)
Basophils Absolute: 0 10*3/uL (ref 0.0–0.1)
Basophils Relative: 1 %
Eosinophils Absolute: 0.1 10*3/uL (ref 0.0–0.5)
Eosinophils Relative: 2 %
HCT: 27.2 % — ABNORMAL LOW (ref 39.0–52.0)
Hemoglobin: 8.5 g/dL — ABNORMAL LOW (ref 13.0–17.0)
Immature Granulocytes: 3 %
Lymphocytes Relative: 20 %
Lymphs Abs: 1.6 10*3/uL (ref 0.7–4.0)
MCH: 29.2 pg (ref 26.0–34.0)
MCHC: 31.3 g/dL (ref 30.0–36.0)
MCV: 93.5 fL (ref 80.0–100.0)
Monocytes Absolute: 0.7 10*3/uL (ref 0.1–1.0)
Monocytes Relative: 9 %
Neutro Abs: 5.1 10*3/uL (ref 1.7–7.7)
Neutrophils Relative %: 65 %
Platelets: 179 10*3/uL (ref 150–400)
RBC: 2.91 MIL/uL — ABNORMAL LOW (ref 4.22–5.81)
RDW: 15.9 % — ABNORMAL HIGH (ref 11.5–15.5)
WBC: 7.8 10*3/uL (ref 4.0–10.5)
nRBC: 0 % (ref 0.0–0.2)

## 2021-01-01 LAB — GLUCOSE, CAPILLARY
Glucose-Capillary: 155 mg/dL — ABNORMAL HIGH (ref 70–99)
Glucose-Capillary: 180 mg/dL — ABNORMAL HIGH (ref 70–99)
Glucose-Capillary: 190 mg/dL — ABNORMAL HIGH (ref 70–99)
Glucose-Capillary: 200 mg/dL — ABNORMAL HIGH (ref 70–99)
Glucose-Capillary: 201 mg/dL — ABNORMAL HIGH (ref 70–99)
Glucose-Capillary: 276 mg/dL — ABNORMAL HIGH (ref 70–99)

## 2021-01-01 LAB — PHOSPHORUS: Phosphorus: 4.1 mg/dL (ref 2.5–4.6)

## 2021-01-01 LAB — MAGNESIUM: Magnesium: 2.4 mg/dL (ref 1.7–2.4)

## 2021-01-01 MED ORDER — SODIUM CHLORIDE 0.9 % IV SOLN
2.0000 g | INTRAVENOUS | Status: AC
  Administered 2021-01-01 – 2021-01-04 (×2): 2 g via INTRAVENOUS
  Filled 2021-01-01 (×3): qty 2

## 2021-01-01 MED ORDER — LIDOCAINE HCL (PF) 2 % IJ SOLN
0.0000 mL | Freq: Once | INTRAMUSCULAR | Status: DC | PRN
  Filled 2021-01-01 (×2): qty 20

## 2021-01-01 MED ORDER — DOCUSATE SODIUM 100 MG PO CAPS
100.0000 mg | ORAL_CAPSULE | Freq: Two times a day (BID) | ORAL | Status: DC
  Administered 2021-01-02 – 2021-01-05 (×7): 100 mg via ORAL
  Filled 2021-01-01 (×8): qty 1

## 2021-01-01 NOTE — Progress Notes (Signed)
[facility] Progress Note   Subjective:    Seen and examined patient during HD. Currently tolerating treatment.   Objective Vitals:   01/01/21 1030 01/01/21 1100 01/01/21 1130 01/01/21 1200  BP: 103/64 110/62 121/67 116/67  Pulse: 82 83 81 83  Resp:      Temp:      TempSrc:      SpO2:      Weight:      Height:       Physical Exam General: Well-appearing male; NAD; on RA Heart: S1 and S2; No murmurs, gallops, or rubs Lungs: Clear throughout, no wheezing, rales, or rhonchi Abdomen: Soft and non-tender Extremities: No edema BLLE Dialysis Access: R TDC; L AVF (+) bruit/thrill: dressing clean, dry, and intact; ischemic ulcers L 2nd/5th fingers: noted mild swelling of L arm and L hand. L hand cool to touch   Filed Weights   12/31/20 0010 12/31/20 0609 01/01/21 0918  Weight: 104.5 kg 101.8 kg 103.7 kg    Intake/Output Summary (Last 24 hours) at 01/01/2021 1246 Last data filed at 01/01/2021 0451 Gross per 24 hour  Intake 509.3 ml  Output 320 ml  Net 189.3 ml    Additional Objective Labs: Basic Metabolic Panel: Recent Labs  Lab 12/29/20 0248 12/30/20 0218 12/30/20 1636 12/31/20 0122 01/01/21 0146  NA 133*  --  130* 132*  --   K 4.7  --  4.6 4.0  --   CL 96*  --  95* 92*  --   CO2 29  --  24 29  --   GLUCOSE 241*  --  229* 145*  --   BUN 38*  --  65* 24*  --   CREATININE 5.56*  --  7.26* 3.89*  --   CALCIUM 8.8*  --  8.8* 7.8*  --   PHOS 2.2*   < > 3.2 2.2* 4.1   < > = values in this interval not displayed.   Liver Function Tests: Recent Labs  Lab 12/28/20 0116 12/29/20 0248 12/30/20 1636 12/31/20 0122  AST 90* 111*  --  74*  ALT 94* 120*  --  70*  ALKPHOS 54 72  --  57  BILITOT 0.3 0.6  --  0.3  PROT 6.2* 6.5  --  6.3*  ALBUMIN 2.9* 3.0* 3.0* 2.9*   No results for input(s): LIPASE, AMYLASE in the last 168 hours. CBC: Recent Labs  Lab 12/28/20 0116 12/29/20 0248 12/30/20 0218 12/31/20 0122 01/01/21 0146  WBC 8.7 10.6* 7.3 11.2* 7.8   NEUTROABS 6.8 8.9* 5.4 9.4* 5.1  HGB 9.6* 10.0* 9.6* 10.4* 8.5*  HCT 29.2* 30.7* 29.1* 31.0* 27.2*  MCV 91.5 91.9 91.8 90.1 93.5  PLT 147* 180 183 207 179   Blood Culture    Component Value Date/Time   SDES BLOOD RIGHT ANTECUBITAL 12/26/2020 1526   SPECREQUEST  12/26/2020 1526    BOTTLES DRAWN AEROBIC ONLY Blood Culture adequate volume   CULT  12/26/2020 1526    NO GROWTH 5 DAYS Performed at [facility], [place] [address], [place], [place] [address]    REPTSTATUS 12/31/2020 FINAL 12/26/2020 1526    Cardiac Enzymes: No results for input(s): CKTOTAL, CKMB, CKMBINDEX, TROPONINI in the last 168 hours. CBG: Recent Labs  Lab 12/31/20 2002 12/31/20 2200 12/31/20 2337 01/01/21 0453 01/01/21 0803  GLUCAP 310* 304* 229* 200* 190*   Iron Studies: No results for input(s): IRON, TIBC, TRANSFERRIN, FERRITIN in the last 72 hours. Lab Results  Component Value Date  INR 1.0 12/24/2020   INR 1.3 (H) 06/23/2020   Studies/Results: No results found.  Medications:  sodium chloride     sodium chloride     sodium chloride     ceFEPime (MAXIPIME) IV      allopurinol  100 mg Oral Daily   aspirin EC  81 mg Oral Daily   atorvastatin  40 mg Oral QHS   Chlorhexidine Gluconate Cloth  6 each Topical Q0600   cholecalciferol  2,000 Units Oral Daily   feeding supplement (NEPRO CARB STEADY)  237 mL Oral BID BM   folic acid  1 mg Oral Daily   heparin  5,000 Units Subcutaneous Q8H   insulin aspart  0-6 Units Subcutaneous Q4H   insulin glargine-yfgn  20 Units Subcutaneous Daily   midodrine  5 mg Oral Once per day on Mon Wed Fri   multivitamin  1 tablet Oral QHS   sodium chloride flush  3 mL Intravenous Q12H   cyanocobalamin  1,000 mcg Oral Daily    Dialysis Orders: MWF at [facility] 4hr, 400/600, EDW 99.5kg, 2K/2.5Ca, AVF + TDC, heparin 5000 - Calcitriol 0.18mg PO q HD - No ESA  Assessment/Plan: 1. Chills/rigors/vomiting: blood cultures obtained from patient's outpatient HD  center (12/23/20) tested positive for enterobacter cloacae complex. Discussed these findings with ID and Primary. VVS consulted-Plan to receive IV cefepime today and 9/10 this evening. Also scheduled to remove R TDC via VVS this afternoon after scheduled HD today 9/9. Patient will then have a line holiday over the weekend and a new TDC will be placed on Monday 9/12.  2. 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 ESRD: Back to usual MWF schedule -No issues with N/V and fevers past 2 treatments. On HD today, UFG 2.5L. 4. HTN/volume: BP low/stable, follow. 5. Anemia: Hgb 10.4, no ESA for now. 6. Secondary hyperparathyroidism: Corr Ca 8.6, Phos currently low-will hold binders for now and monitor trend. 7. Nutrition: Alb low, continue protein supplements. 8. T2DM  CTobie Poet NP [facility] 01/01/2021,12:46 PM  LOS: 8 days

## 2021-01-01 NOTE — Progress Notes (Signed)
PROGRESS NOTE    Kevin Meyer  MWU:[id number] DOB: October 28, 1970 DOA: 12/24/2020 PCP: Royce Macadamia D., PA-C   Brief Narrative:  Kevin Meyer is a 50 year old gentleman with past history of essential hypertension, hyperlipidemia, OA, gout, anemia, type 2 diabetes mellitus, diabetic nephropathy, ESRD on HD MWF s/p right-sided tunneled catheter and left AV fistula, obesity, chronic systolic CHF [facility] with complaint of fever and left hand pain.  Was diagnosed with ulcers and vascular insufficiency as well as infection of the left fingers.  Transferred to [facility] for vascular consult.  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  Patient underwent angiogram/aortogram and selective catheterization of the left brachial artery with left upper extremity runoff.  Vein mapping done.  Patient underwent DRIL procedure on 12/30/2020.  Patient did develop fever with a T-max of 101.2 with tachycardia afternoon of 12/26/2020 and met criteria for sepsis.  Per vascular surgery patient was continued on vancomycin and Keflex.  Blood cultures were drawn and remain negative.  Lactic acid normal.  Has remained afebrile until 10 PM of 12/28/2020 when he had T-max of 103.  He has once again remained afebrile since then.  Since there was no source of his intermittent high-grade fever, nephrology recommended consulting ID, ID was consulted.  Due to patient having rigors at dialysis, blood culture was drawn from his line on  12/23/2020 at outpatient dialysis center and that grew enterococci.  ID has ordered removal of the [facility] catheter and 1 dose of cefepime today and then dosed with cefepime after dialysis. Further management to ID and vascular surgery.  High-grade intermittent fever with elevated LFTs: Informed by dialysis staff on 12/30/2020 that patient had hepatitis B exposure in dialysis unit on 12/26/2020 and that all the patient's are being screened for hepatitis and the labs are already ordered after nephrology/dialysis unit has consulted with ID.  Patient had rising LFTs.  LFTs are now improving.  He is ruled out of acute hepatitis panel however hepatitis B surface antibody is pending.  Further per ID.  ESRD on HD: Per nephrology.  Hypokalemia: Resolved.  Hypotension: Chronic and at baseline without symptoms.  He is on midodrine.  Labetalol discontinued.  Type 2 diabetes mellitus with nephropathy: Hemoglobin A1c 7.2.  Continue SSI.  Hyperlipidemia: Continue statin.  Class II obesity: Counseling for weight loss provided.  DVT prophylaxis: heparin injection 5,000 Units Start: 12/25/20 2200 SCDs Start: 12/24/20 1426   Code Status: Full Code  Family Communication:  None present at bedside.  Plan of care discussed with patient and he verbalized understanding.  Status is: Inpatient  Remains inpatient appropriate because:Ongoing diagnostic testing needed not appropriate for outpatient work up  Dispo: The patient is from: Home              Anticipated d/c is to: Home              Patient currently is not medically stable to d/c.   Difficult to place patient No   Estimated body  mass index is 36.22 kg/m as calculated from the following:   Height as of this encounter: _0  (1.676 m).   Weight as of this encounter: 101.8 kg.     Nutritional Assessment: Body mass index is 36.22 kg/m.Marland Kitchen Seen by dietician.  I agree with the assessment and plan as outlined below: Nutrition Status: Nutrition Problem:  Increased nutrient needs Etiology: wound healing Signs/Symptoms: estimated needs Interventions: Nepro shake, MVI  Skin Assessment: I have examined the patient's skin and I agree with the wound assessment as performed by the wound care RN as outlined below:    Consultants:  Vascular surgery ID  Procedures:  As above  Antimicrobials:  Anti-infectives (From admission, onward)    Start     Dose/Rate Route Frequency Ordered Stop   12/31/20 1115  ceFEPIme (MAXIPIME) 2 g in sodium chloride 0.9 % 100 mL IVPB        2 g 200 mL/hr over 30 Minutes Intravenous  Once 12/31/20 1018 12/31/20 1224   12/30/20 0755  ceFAZolin (ANCEF) 2-4 GM/100ML-% IVPB       Note to Pharmacy: Kevin Meyer   : cabinet override      12/30/20 0755 12/30/20 1959   12/26/20 0745  vancomycin (VANCOCIN) IVPB 1000 mg/200 mL premix        1,000 mg 200 mL/hr over 60 Minutes Intravenous  Once 12/26/20 0736 12/26/20 1030   12/25/20 2200  cephALEXin (KEFLEX) capsule 500 mg  Status:  Discontinued        500 mg Oral Every 12 hours 12/25/20 1056 12/30/20 1639   12/25/20 1600  ceFEPIme (MAXIPIME) 2 g in sodium chloride 0.9 % 100 mL IVPB  Status:  Discontinued        2 g 200 mL/hr over 30 Minutes Intravenous Every M-W-F (Hemodialysis) 12/24/20 1233 12/25/20 1056   12/25/20 1600  vancomycin (VANCOCIN) IVPB 1000 mg/200 mL premix  Status:  Discontinued        1,000 mg 200 mL/hr over 60 Minutes Intravenous Every M-W-F (Hemodialysis) 12/24/20 1233 12/30/20 1639   12/24/20 1000  vancomycin (VANCOREADY) IVPB 2000 mg/400 mL        2,000 mg 200 mL/hr over 120 Minutes Intravenous  Once 12/24/20 0946 12/24/20 1455   12/24/20 0945  ceFEPIme (MAXIPIME) 2 g in sodium chloride 0.9 % 100 mL IVPB        2 g 200 mL/hr over 30 Minutes Intravenous  Once 12/24/20 0941 12/24/20 1133   12/24/20 0945  vancomycin (VANCOCIN) IVPB 1000 mg/200 mL premix  Status:  Discontinued        1,000 mg 200 mL/hr over 60 Minutes Intravenous  Once 12/24/20 0941  12/24/20 0946   12/24/20 0945  metroNIDAZOLE (FLAGYL) IVPB 500 mg  Status:  Discontinued        500 mg 100 mL/hr over 60 Minutes Intravenous Every 12 hours 12/24/20 0941 12/25/20 1056          Subjective: Patient seen in dialysis.  He says left hand is cold and his power is still 4 out of 5.  No new complaint.  No shortness of breath.   Objective: Vitals:   12/31/20 2000 12/31/20 2336 01/01/21 0450 01/01/21 0802  BP: 111/63 115/77 117/65 137/75  Pulse: 80 90 90 97  Resp: _1 Temp: 98.8 F (37.1 C) 99 F (37.2 C) 99.2 F (37.3 C) 98.4 F (36.9 C)  TempSrc: Oral Oral Oral Oral  SpO2: 97% 96% 94% 95%  Weight:  Height:        Intake/Output Summary (Last 24 hours) at 01/01/2021 0925 Last data filed at 01/01/2021 0451 Gross per 24 hour  Intake 509.3 ml  Output 420 ml  Net 89.3 ml    Filed Weights   12/30/20 2025 12/31/20 0010 12/31/20 0609  Weight: 106 kg 104.5 kg 101.8 kg    Examination:  General exam: Appears calm and comfortable, obese Respiratory system: Clear to auscultation. Respiratory effort normal. Cardiovascular system: S1 & S2 heard, RRR. No pedal edema. Gastrointestinal system: Abdomen is nondistended, soft and nontender. No masses felt. Normal bowel sounds heard. Central nervous system: Alert and oriented. No focal neurological deficits. Extremities: Subjective weakness left hand, otherwise symmetric 5 x 5 power. Skin: Ulceration of 2-3 fingers of the left hand. Psychiatry: Judgement and insight appear normal. Mood & affect appropriate.    Data Reviewed: I have personally reviewed following labs and imaging studies  CBC: Recent Labs  Lab 12/28/20 0116 12/29/20 0248 12/30/20 0218 12/31/20 0122 01/01/21 0146  WBC 8.7 10.6* 7.3 11.2* 7.8  NEUTROABS 6.8 8.9* 5.4 9.4* 5.1  HGB 9.6* 10.0* 9.6* 10.4* 8.5*  HCT 29.2* 30.7* 29.1* 31.0* 27.2*  MCV 91.5 91.9 91.8 90.1 93.5  PLT 147* 180 183 207 591    Basic Metabolic Panel: Recent Labs   Lab 12/27/20 0149 12/28/20 0116 12/29/20 0248 12/30/20 0218 12/30/20 1636 12/31/20 0122 01/01/21 0146  NA 132* 133* 133*  --  130* 132*  --   K 3.7 3.7 4.7  --  4.6 4.0  --   CL 93* 94* 96*  --  95* 92*  --   CO2 _0 --  24 29  --   GLUCOSE 156* 149* 241*  --  229* 145*  --   BUN 39* 66* 38*  --  65* 24*  --   CREATININE 6.13* 7.63* 5.56*  --  7.26* 3.89*  --   CALCIUM 8.6* 8.7* 8.8*  --  8.8* 7.8*  --   MG 2.1 2.4 2.1 2.3  --  2.2 2.4  PHOS 4.1 5.1* 2.2* 3.0 3.2 2.2* 4.1    GFR: Estimated Creatinine Clearance: 25.4 mL/min (A) (by C-G formula based on SCr of 3.89 mg/dL (H)). Liver Function Tests: Recent Labs  Lab 12/26/20 0717 12/27/20 0149 12/28/20 0116 12/29/20 0248 12/30/20 1636 12/31/20 0122  AST 33 84* 90* 111*  --  74*  ALT 33 77* 94* 120*  --  70*  ALKPHOS 49 63 54 72  --  57  BILITOT 0.7 0.6 0.3 0.6  --  0.3  PROT 6.3* 6.5 6.2* 6.5  --  6.3*  ALBUMIN 3.1* 3.1* 2.9* 3.0* 3.0* 2.9*    No results for input(s): LIPASE, AMYLASE in the last 168 hours. No results for input(s): AMMONIA in the last 168 hours. Coagulation Profile: No results for input(s): INR, PROTIME in the last 168 hours.  Cardiac Enzymes: No results for input(s): CKTOTAL, CKMB, CKMBINDEX, TROPONINI in the last 168 hours. BNP (last 3 results) No results for input(s): PROBNP in the last 8760 hours. HbA1C: No results for input(s): HGBA1C in the last 72 hours.  CBG: Recent Labs  Lab 12/31/20 2002 12/31/20 2200 12/31/20 2337 01/01/21 0453 01/01/21 0803  GLUCAP 310* 304* 229* 200* 190*    Lipid Profile: No results for input(s): CHOL, HDL, LDLCALC, TRIG, CHOLHDL, LDLDIRECT in the last 72 hours. Thyroid Function Tests: No results for input(s): TSH, T4TOTAL, FREET4, T3FREE, THYROIDAB in the last 72 hours.  Anemia Panel: No results for input(s): VITAMINB12, FOLATE, FERRITIN, TIBC, IRON, RETICCTPCT in the last 72 hours. Sepsis Labs: Recent Labs  Lab 12/26/20 1337  LATICACIDVEN 1.2      Recent Results (from the past 240 hour(s))  Blood Culture (routine x 2)     Status: None   Collection Time: 12/24/20 10:13 AM   Specimen: BLOOD RIGHT WRIST  Result Value Ref Range Status   Specimen Description BLOOD RIGHT WRIST  Final   Special Requests   Final    BOTTLES DRAWN AEROBIC AND ANAEROBIC Blood Culture adequate volume   Culture   Final    NO GROWTH 5 DAYS Performed at [facility], [address], [place], [place] [address]    Report Status 12/29/2020 FINAL  Final  Blood Culture (routine x 2)     Status: None   Collection Time: 12/24/20 10:14 AM   Specimen: Right Antecubital; Blood  Result Value Ref Range Status   Specimen Description RIGHT ANTECUBITAL  Final   Special Requests   Final    BOTTLES DRAWN AEROBIC AND ANAEROBIC Blood Culture adequate volume   Culture   Final    NO GROWTH 5 DAYS Performed at [facility], [address] [place], [place] [address]    Report Status 12/29/2020 FINAL  Final  Resp Panel by RT-PCR (Flu A&B, Covid) Nasopharyngeal Swab     Status: None   Collection Time: 12/24/20 11:08 AM   Specimen: Nasopharyngeal Swab; Nasopharyngeal(NP) swabs in vial transport medium  Result Value Ref Range Status   SARS Coronavirus 2 by RT PCR NEGATIVE NEGATIVE Final    Comment: (NOTE) SARS-CoV-2 target nucleic acids are NOT DETECTED.  The SARS-CoV-2 RNA is generally detectable in upper respiratory specimens during the acute phase of infection. The lowest concentration of SARS-CoV-2 viral copies this assay can detect is 138 copies/mL. A negative result does not preclude SARS-Cov-2 infection and should not be used as the sole basis for treatment or other patient management decisions. A negative result may occur with  improper specimen collection/handling, submission of specimen other than nasopharyngeal swab, presence of viral mutation(s) within the areas targeted by this assay, and inadequate number of viral copies(<138 copies/mL). A negative result  must be combined with clinical observations, patient history, and epidemiological information. The expected result is Negative.  Fact Sheet for Patients:  EntrepreneurPulse.com.au  Fact Sheet for Healthcare Providers:  IncredibleEmployment.be  This test is no t yet approved or cleared by the [place] FDA and  has been authorized for detection and/or diagnosis of SARS-CoV-2 by FDA under an Emergency Use Authorization (EUA). This EUA will remain  in effect (meaning this test can be used) for the duration of the COVID-19 declaration under Section 564(b)(1) of the Act, 21 U.S.C.section 360bbb-3(b)(1), unless the authorization is terminated  or revoked sooner.       Influenza A by PCR NEGATIVE NEGATIVE Final   Influenza B by PCR NEGATIVE NEGATIVE Final    Comment: (NOTE) The Xpert Xpress SARS-CoV-2/FLU/RSV plus assay is intended as an aid in the diagnosis of influenza from Nasopharyngeal swab specimens and should not be used as a sole basis for treatment. Nasal washings and aspirates are unacceptable for Xpert Xpress SARS-CoV-2/FLU/RSV testing.  Fact Sheet for Patients: EntrepreneurPulse.com.au  Fact Sheet for Healthcare Providers: IncredibleEmployment.be  This test is not yet approved or cleared by the [place] FDA and has been authorized for detection and/or diagnosis of SARS-CoV-2 by FDA under an Emergency Use Authorization (EUA). This EUA will  remain in effect (meaning this test can be used) for the duration of the COVID-19 declaration under Section 564(b)(1) of the Act, 21 U.S.C. section 360bbb-3(b)(1), unless the authorization is terminated or revoked.  Performed at [facility], [address] [place], [place] [address]   Culture, blood (routine x 2)     Status: None   Collection Time: 12/26/20  1:52 PM   Specimen: BLOOD  Result Value Ref Range Status   Specimen Description BLOOD BLOOD  LEFT FOREARM  Final   Special Requests   Final    BOTTLES DRAWN AEROBIC AND ANAEROBIC Blood Culture adequate volume   Culture   Final    NO GROWTH 5 DAYS Performed at [facility], [place] [address], [place], [place] [address]    Report Status 12/31/2020 FINAL  Final  Culture, blood (routine x 2)     Status: None   Collection Time: 12/26/20  3:26 PM   Specimen: BLOOD  Result Value Ref Range Status   Specimen Description BLOOD RIGHT ANTECUBITAL  Final   Special Requests   Final    BOTTLES DRAWN AEROBIC ONLY Blood Culture adequate volume   Culture   Final    NO GROWTH 5 DAYS Performed at [facility],  [address], [place], [place] [address]    Report Status 12/31/2020 FINAL  Final  Surgical pcr screen     Status: None   Collection Time: 12/29/20 12:35 PM   Specimen: Nasal Mucosa; Nasal Swab  Result Value Ref Range Status   MRSA, PCR NEGATIVE NEGATIVE Final   Staphylococcus aureus NEGATIVE NEGATIVE Final    Comment: (NOTE) The Xpert SA Assay (FDA approved for NASAL specimens in patients 45 years of age and older), is one component of a comprehensive surveillance program. It is not intended to diagnose infection nor to guide or monitor treatment. Performed at [facility], [place] [address], [place], [place] [address]        Radiology Studies: No results found.  Scheduled Meds:  allopurinol  100 mg Oral Daily   aspirin EC  81 mg Oral Daily   atorvastatin  40 mg Oral QHS   Chlorhexidine Gluconate Cloth  6 each Topical Q0600   cholecalciferol  2,000 Units Oral Daily   feeding supplement (NEPRO CARB STEADY)  237 mL Oral BID BM   folic acid  1 mg Oral Daily   heparin  5,000 Units Subcutaneous Q8H   insulin aspart  0-6 Units Subcutaneous Q4H   insulin glargine-yfgn  20 Units Subcutaneous Daily   midodrine  5 mg Oral Once per day on Mon Wed Fri   multivitamin  1 tablet Oral QHS   sodium chloride flush  3 mL Intravenous Q12H   cyanocobalamin  1,000 mcg Oral  Daily   Continuous Infusions:  sodium chloride     sodium chloride     sodium chloride       LOS: 8 days   Yaakov Guthrie, MD [facility]  01/01/2021, 9:25 AM   How to contact the [facility] Attending or Consulting provider [place] or covering provider during after hours [place], for this patient?  Check the care team in [facility] and look for a) attending/consulting [facility] provider listed and b) the [facility] team listed. Page or secure chat [phone]. Log into [email or web address] and use [place]'s universal password to access. If you do not have the password, please contact the hospital operator. Locate the [facility] provider you are looking for  under [facility] and page to a number that you can be directly reached. If you still have difficulty reaching the provider, please page the [facility] (Director on Call) for the Hospitalists listed on amion for assistance.

## 2021-01-01 NOTE — Progress Notes (Signed)
  Catheter Removal Procedure Note    Diagnosis: ESRD  Plan:  Remove right diatek catheter  Consent signed:  Yes.   Time out completed:  Yes.   Coumadin:  No. PT/INR (if applicable):   Other labs:  Procedure: 1.  Sterile prepping and draping over catheter area 2. 4 ml 2% lidocaine plain instilled at removal site. 3.  right catheter removed in its entirety with cuff in tact. 4.  Complications:  none 5. Tip of catheter sent for culture:  Yes.     Patient tolerated procedure well:  Yes.   Pressure held, no bleeding noted, dressing applied Instructions given to the pt regarding wound care and bleeding.  OtherLeontine Locket, PA-C 01/01/2021 3:06 PM [phone]

## 2021-01-01 NOTE — Progress Notes (Signed)
[facility]  Date of Admission:  12/24/2020   Total days of antibiotics 8        Keflex day 6 > DC                                                                                    Vancomycin day 1 > DC                                                                                     1 dose of Cefazolin        1 dose of cefepime  ASSESSMENT: Patient with Enterobacter line infection is doing well.  He is afebrile with no leukocytosis.  Blood culture remains negative.  He received 1 dose of IV cefepime yesterday.  His right TDC will be removed after HD this morning.  We will give 1 more dose of cefepime today and another dose on Monday with HD.  PLAN: TDC removal per plan 1 dose of cefepime today and Monday  Active Problems:   Hypertension   Type 2 diabetes with nephropathy (HCC)   Class 2 obesity due to excess calories with body mass index (BMI) of 39.0 to 39.9 in adult   Hyperlipidemia   Osteoarthritis   Diabetes mellitus type 2, controlled, with complications (HCC)   Infection of finger LEFT   Finger infection   Scheduled Meds:  allopurinol  100 mg Oral Daily   aspirin EC  81 mg Oral Daily   atorvastatin  40 mg Oral QHS   Chlorhexidine Gluconate Cloth  6 each Topical Q0600   cholecalciferol  2,000 Units Oral Daily   feeding supplement (NEPRO CARB STEADY)  237 mL Oral BID BM   folic acid  1 mg Oral Daily   heparin  5,000 Units Subcutaneous Q8H   insulin aspart  0-6 Units Subcutaneous Q4H   insulin glargine-yfgn  20 Units Subcutaneous Daily   midodrine  5 mg Oral Once per day on Mon Wed Fri   multivitamin  1 tablet Oral QHS   sodium chloride flush  3 mL Intravenous Q12H   cyanocobalamin  1,000 mcg Oral Daily   Continuous Infusions:  sodium chloride     sodium chloride     sodium chloride     ceFEPime (MAXIPIME) IV     PRN Meds:.sodium chloride, sodium chloride, sodium chloride, acetaminophen, alteplase, heparin, hydrALAZINE,  lidocaine (PF), lidocaine HCl (PF), lidocaine-prilocaine, ondansetron **OR** ondansetron (ZOFRAN) IV, ondansetron (ZOFRAN) IV, pentafluoroprop-tetrafluoroeth, sodium chloride flush, sorbitol   SUBJECTIVE: Patient is seen on HD.  He appears comfortable in no acute distress.  He reports feeling well and denies any complaints.  Review of Systems: ROS Per HPI  Allergies  Allergen Reactions   Gabapentin Anaphylaxis   Lyrica [Pregabalin] Anaphylaxis  OBJECTIVE: Vitals:   01/01/21 0918 01/01/21 0925 01/01/21 0930 01/01/21 1000  BP: 129/75 (!) 144/78 130/71 112/67  Pulse: 98 92 91 82  Resp: 18     Temp: 98.8 F (37.1 C)     TempSrc: Oral     SpO2: 96%     Weight: 103.7 kg     Height:       Body mass index is 36.9 kg/m.  Physical Exam Constitutional:      General: He is not in acute distress.    Appearance: He is not toxic-appearing.  HENT:     Head: Normocephalic.  Cardiovascular:     Rate and Rhythm: Normal rate and regular rhythm.  Pulmonary:     Effort: Pulmonary effort is normal.     Breath sounds: Normal breath sounds.  Abdominal:     General: There is no distension.     Tenderness: There is no abdominal tenderness.  Neurological:     Mental Status: He is alert and oriented to person, place, and time.  Psychiatric:        Mood and Affect: Mood normal.        Behavior: Behavior normal.    Lab Results Lab Results  Component Value Date   WBC 7.8 01/01/2021   HGB 8.5 (L) 01/01/2021   HCT 27.2 (L) 01/01/2021   MCV 93.5 01/01/2021   PLT 179 01/01/2021    Lab Results  Component Value Date   CREATININE 3.89 (H) 12/31/2020   BUN 24 (H) 12/31/2020   NA 132 (L) 12/31/2020   K 4.0 12/31/2020   CL 92 (L) 12/31/2020   CO2 29 12/31/2020    Lab Results  Component Value Date   ALT 70 (H) 12/31/2020   AST 74 (H) 12/31/2020   ALKPHOS 57 12/31/2020   BILITOT 0.3 12/31/2020     Microbiology: Recent Results (from the past 240 hour(s))  Blood Culture (routine x  2)     Status: None   Collection Time: 12/24/20 10:13 AM   Specimen: BLOOD RIGHT WRIST  Result Value Ref Range Status   Specimen Description BLOOD RIGHT WRIST  Final   Special Requests   Final    BOTTLES DRAWN AEROBIC AND ANAEROBIC Blood Culture adequate volume   Culture   Final    NO GROWTH 5 DAYS Performed at [facility], [address] [place], [place] [address]    Report Status 12/29/2020 FINAL  Final  Blood Culture (routine x 2)     Status: None   Collection Time: 12/24/20 10:14 AM   Specimen: Right Antecubital; Blood  Result Value Ref Range Status   Specimen Description RIGHT ANTECUBITAL  Final   Special Requests   Final    BOTTLES DRAWN AEROBIC AND ANAEROBIC Blood Culture adequate volume   Culture   Final    NO GROWTH 5 DAYS Performed at [facility], [address], [place], [place] [address]    Report Status 12/29/2020 FINAL  Final  Resp Panel by RT-PCR (Flu A&B, Covid) Nasopharyngeal Swab     Status: None   Collection Time: 12/24/20 11:08 AM   Specimen: Nasopharyngeal Swab; Nasopharyngeal(NP) swabs in vial transport medium  Result Value Ref Range Status   SARS Coronavirus 2 by RT PCR NEGATIVE NEGATIVE Final    Comment: (NOTE) SARS-CoV-2 target nucleic acids are NOT DETECTED.  The SARS-CoV-2 RNA is generally detectable in upper respiratory specimens during the acute phase of infection. The lowest concentration of SARS-CoV-2 viral copies this assay can  detect is 138 copies/mL. A negative result does not preclude SARS-Cov-2 infection and should not be used as the sole basis for treatment or other patient management decisions. A negative result may occur with  improper specimen collection/handling, submission of specimen other than nasopharyngeal swab, presence of viral mutation(s) within the areas targeted by this assay, and inadequate number of viral copies(<138 copies/mL). A negative result must be combined with clinical observations, patient history, and  epidemiological information. The expected result is Negative.  Fact Sheet for Patients:  EntrepreneurPulse.com.au  Fact Sheet for Healthcare Providers:  IncredibleEmployment.be  This test is no t yet approved or cleared by the [place] FDA and  has been authorized for detection and/or diagnosis of SARS-CoV-2 by FDA under an Emergency Use Authorization (EUA). This EUA will remain  in effect (meaning this test can be used) for the duration of the COVID-19 declaration under Section 564(b)(1) of the Act, 21 U.S.C.section 360bbb-3(b)(1), unless the authorization is terminated  or revoked sooner.       Influenza A by PCR NEGATIVE NEGATIVE Final   Influenza B by PCR NEGATIVE NEGATIVE Final    Comment: (NOTE) The Xpert Xpress SARS-CoV-2/FLU/RSV plus assay is intended as an aid in the diagnosis of influenza from Nasopharyngeal swab specimens and should not be used as a sole basis for treatment. Nasal washings and aspirates are unacceptable for Xpert Xpress SARS-CoV-2/FLU/RSV testing.  Fact Sheet for Patients: EntrepreneurPulse.com.au  Fact Sheet for Healthcare Providers: IncredibleEmployment.be  This test is not yet approved or cleared by the [place] FDA and has been authorized for detection and/or diagnosis of SARS-CoV-2 by FDA under an Emergency Use Authorization (EUA). This EUA will remain in effect (meaning this test can be used) for the duration of the COVID-19 declaration under Section 564(b)(1) of the Act, 21 U.S.C. section 360bbb-3(b)(1), unless the authorization is terminated or revoked.  Performed at [facility], [address], [place], [place] [address]   Culture, blood (routine x 2)     Status: None   Collection Time: 12/26/20  1:52 PM   Specimen: BLOOD  Result Value Ref Range Status   Specimen Description BLOOD BLOOD LEFT FOREARM  Final   Special Requests   Final    BOTTLES DRAWN  AEROBIC AND ANAEROBIC Blood Culture adequate volume   Culture   Final    NO GROWTH 5 DAYS Performed at [facility], [place] [address] [place], [place] [address]    Report Status 12/31/2020 FINAL  Final  Culture, blood (routine x 2)     Status: None   Collection Time: 12/26/20  3:26 PM   Specimen: BLOOD  Result Value Ref Range Status   Specimen Description BLOOD RIGHT ANTECUBITAL  Final   Special Requests   Final    BOTTLES DRAWN AEROBIC ONLY Blood Culture adequate volume   Culture   Final    NO GROWTH 5 DAYS Performed at [facility], [place] [address] [place], [place] [address]    Report Status 12/31/2020 FINAL  Final  Surgical pcr screen     Status: None   Collection Time: 12/29/20 12:35 PM   Specimen: Nasal Mucosa; Nasal Swab  Result Value Ref Range Status   MRSA, PCR NEGATIVE NEGATIVE Final   Staphylococcus aureus NEGATIVE NEGATIVE Final    Comment: (NOTE) The Xpert SA Assay (FDA approved for NASAL specimens in patients 51 years of age and older), is one component of a comprehensive surveillance program. It is not intended to diagnose  infection nor to guide or monitor treatment. Performed at [facility], [place] [address], [place], [place] [address]     Gaylan Gerold, [facility] [phone] pager    01/01/2021, 10:55 AM

## 2021-01-01 NOTE — Progress Notes (Addendum)
  Progress Note    01/01/2021 7:37 AM 2 Days Post-Op  Subjective:  says his hand feels much better  Tm 99.2  Vitals:   12/31/20 2336 01/01/21 0450  BP: 115/77 117/65  Pulse: 90 90  Resp: 17 17  Temp: 99 F (37.2 C) 99.2 F (37.3 C)  SpO2: 96% 94%    Physical Exam: General:  no distress Lungs:  non labored Incisions:  bandage removed and incisions look good with steri strips in place Extremities:  left hand is warm; motor 4/5; sensory in tact  CBC    Component Value Date/Time   WBC 7.8 01/01/2021 0146   RBC 2.91 (L) 01/01/2021 0146   HGB 8.5 (L) 01/01/2021 0146   HCT 27.2 (L) 01/01/2021 0146   PLT 179 01/01/2021 0146   MCV 93.5 01/01/2021 0146   MCH 29.2 01/01/2021 0146   MCHC 31.3 01/01/2021 0146   RDW 15.9 (H) 01/01/2021 0146   LYMPHSABS 1.6 01/01/2021 0146   MONOABS 0.7 01/01/2021 0146   EOSABS 0.1 01/01/2021 0146   BASOSABS 0.0 01/01/2021 0146    BMET    Component Value Date/Time   NA 132 (L) 12/31/2020 0122   K 4.0 12/31/2020 0122   CL 92 (L) 12/31/2020 0122   CO2 29 12/31/2020 0122   GLUCOSE 145 (H) 12/31/2020 0122   BUN 24 (H) 12/31/2020 0122   CREATININE 3.89 (H) 12/31/2020 0122   CALCIUM 7.8 (L) 12/31/2020 0122   GFRNONAA 18 (L) 12/31/2020 0122   GFRAA 50 (L) 09/10/2017 0449    INR    Component Value Date/Time   INR 1.0 12/24/2020 1013     Intake/Output Summary (Last 24 hours) at 01/01/2021 0737 Last data filed at 01/01/2021 0451 Gross per 24 hour  Intake 509.3 ml  Output 420 ml  Net 89.3 ml     Assessment/Plan:  50 y.o. male is s/p:  LUE DRIL procedure for steal syndrome  2 Days Post-Op   -pt states his hand feels much better. -for HD this morning with plans to remove catheter this afternoon for line holiday over the weekend.  Discussed this with pt.  -consent ordered as well as communication for supplies needed.    Leontine Locket, PA-C [facility] [phone] 01/01/2021 7:37 AM

## 2021-01-02 DIAGNOSIS — R509 Fever, unspecified: Secondary | ICD-10-CM | POA: Diagnosis not present

## 2021-01-02 DIAGNOSIS — S61402A Unspecified open wound of left hand, initial encounter: Secondary | ICD-10-CM | POA: Diagnosis not present

## 2021-01-02 DIAGNOSIS — L089 Local infection of the skin and subcutaneous tissue, unspecified: Secondary | ICD-10-CM | POA: Diagnosis not present

## 2021-01-02 LAB — CBC WITH DIFFERENTIAL/PLATELET
Abs Immature Granulocytes: 0.24 10*3/uL — ABNORMAL HIGH (ref 0.00–0.07)
Basophils Absolute: 0 10*3/uL (ref 0.0–0.1)
Basophils Relative: 0 %
Eosinophils Absolute: 0.1 10*3/uL (ref 0.0–0.5)
Eosinophils Relative: 1 %
HCT: 28.4 % — ABNORMAL LOW (ref 39.0–52.0)
Hemoglobin: 9.1 g/dL — ABNORMAL LOW (ref 13.0–17.0)
Immature Granulocytes: 3 %
Lymphocytes Relative: 17 %
Lymphs Abs: 1.6 10*3/uL (ref 0.7–4.0)
MCH: 30 pg (ref 26.0–34.0)
MCHC: 32 g/dL (ref 30.0–36.0)
MCV: 93.7 fL (ref 80.0–100.0)
Monocytes Absolute: 0.8 10*3/uL (ref 0.1–1.0)
Monocytes Relative: 9 %
Neutro Abs: 6.4 10*3/uL (ref 1.7–7.7)
Neutrophils Relative %: 70 %
Platelets: 189 10*3/uL (ref 150–400)
RBC: 3.03 MIL/uL — ABNORMAL LOW (ref 4.22–5.81)
RDW: 15.8 % — ABNORMAL HIGH (ref 11.5–15.5)
WBC: 9.2 10*3/uL (ref 4.0–10.5)
nRBC: 0 % (ref 0.0–0.2)

## 2021-01-02 LAB — PHOSPHORUS: Phosphorus: 3.2 mg/dL (ref 2.5–4.6)

## 2021-01-02 LAB — GLUCOSE, CAPILLARY
Glucose-Capillary: 137 mg/dL — ABNORMAL HIGH (ref 70–99)
Glucose-Capillary: 147 mg/dL — ABNORMAL HIGH (ref 70–99)
Glucose-Capillary: 197 mg/dL — ABNORMAL HIGH (ref 70–99)
Glucose-Capillary: 249 mg/dL — ABNORMAL HIGH (ref 70–99)
Glucose-Capillary: 180 mg/dL — ABNORMAL HIGH (ref 70–99)

## 2021-01-02 LAB — BASIC METABOLIC PANEL
Anion gap: 7 (ref 5–15)
BUN: 25 mg/dL — ABNORMAL HIGH (ref 6–20)
CO2: 29 mmol/L (ref 22–32)
Calcium: 8.6 mg/dL — ABNORMAL LOW (ref 8.9–10.3)
Chloride: 98 mmol/L (ref 98–111)
Creatinine, Ser: 4.26 mg/dL — ABNORMAL HIGH (ref 0.61–1.24)
GFR, Estimated: 16 mL/min — ABNORMAL LOW (ref >60)
Glucose, Bld: 164 mg/dL — ABNORMAL HIGH (ref 70–99)
Potassium: 3.8 mmol/L (ref 3.5–5.1)
Sodium: 134 mmol/L — ABNORMAL LOW (ref 135–145)

## 2021-01-02 LAB — MAGNESIUM: Magnesium: 2.4 mg/dL (ref 1.7–2.4)

## 2021-01-02 MED ORDER — CALCITRIOL 0.25 MCG PO CAPS
0.5000 ug | ORAL_CAPSULE | ORAL | Status: DC
  Administered 2021-01-04: 0.5 ug via ORAL
  Filled 2021-01-02: qty 2

## 2021-01-02 MED ORDER — DARBEPOETIN ALFA 60 MCG/0.3ML IJ SOSY
60.0000 ug | PREFILLED_SYRINGE | INTRAMUSCULAR | Status: DC
  Administered 2021-01-04: 60 ug via INTRAVENOUS
  Filled 2021-01-02 (×2): qty 0.3

## 2021-01-02 NOTE — Progress Notes (Addendum)
  Progress Note    01/02/2021 8:11 AM 3 Days Post-Op  Subjective:  no complaints; wants to know when he's going home. Hand continues to feel much better.  Still can't completely bend the first finger.   Tm 99.2 now afebrile  Vitals:   01/01/21 2335 01/02/21 0437  BP: 109/66 121/66  Pulse: 90 86  Resp: 15 17  Temp: 98.7 F (37.1 C) 98.7 F (37.1 C)  SpO2: 90% 96%    Physical Exam: General:  no distress Lungs:  non labored Incisions:  left arm incisions are clean and dry with steri strips in place; exit site for [facility] is clean.  Extremities:  left hand is warm; sensory in tact   CBC    Component Value Date/Time   WBC 9.2 01/02/2021 0055   RBC 3.03 (L) 01/02/2021 0055   HGB 9.1 (L) 01/02/2021 0055   HCT 28.4 (L) 01/02/2021 0055   PLT 189 01/02/2021 0055   MCV 93.7 01/02/2021 0055   MCH 30.0 01/02/2021 0055   MCHC 32.0 01/02/2021 0055   RDW 15.8 (H) 01/02/2021 0055   LYMPHSABS 1.6 01/02/2021 0055   MONOABS 0.8 01/02/2021 0055   EOSABS 0.1 01/02/2021 0055   BASOSABS 0.0 01/02/2021 0055    BMET    Component Value Date/Time   NA 134 (L) 01/02/2021 0055   K 3.8 01/02/2021 0055   CL 98 01/02/2021 0055   CO2 29 01/02/2021 0055   GLUCOSE 164 (H) 01/02/2021 0055   BUN 25 (H) 01/02/2021 0055   CREATININE 4.26 (H) 01/02/2021 0055   CALCIUM 8.6 (L) 01/02/2021 0055   GFRNONAA 16 (L) 01/02/2021 0055   GFRAA 50 (L) 09/10/2017 0449    INR    Component Value Date/Time   INR 1.0 12/24/2020 1013     Intake/Output Summary (Last 24 hours) at 01/02/2021 0811 Last data filed at 01/02/2021 0300 Gross per 24 hour  Intake 340 ml  Output 2000 ml  Net -1660 ml     Assessment/Plan:  50 y.o. male is s/p:  LUE DRIL procedure for steal syndrome  3 Days Post-Op   -pt doing well and hand continues to improve.   -tolerated removal of TDC well yesterday. Okay for pt to shower and place band aid over exit site.  -catheter tip sent for culture.  -pt for [facility] placement with Dr.  Stanford Breed on Monday.  If discharged by primary team, okay for pt to return as outpatient for [facility] on Monday.     Leontine Locket, PA-C [facility] [phone] 01/02/2021 8:11 AM  VASCULAR STAFF ADDENDUM: I have independently interviewed and examined the patient. I agree with the above.  Looks great. Pulse in graft. Thrill in fistula. Hand warm. Line holiday over the weekend. Plan [facility] Monday by me or one of my partners.  Yevonne Aline. Stanford Breed, MD [facility] Phone Number: [phone] 01/02/2021 9:36 AM

## 2021-01-02 NOTE — Progress Notes (Signed)
[facility] Progress Note   Subjective:   Patient seen and examined at bedside.  Feeling well.  Denies CP, SOB, orthopnea, edema, abdominal pain, fever, chills, n/v/d, weakness and fatigue.    Objective Vitals:   01/01/21 2016 01/01/21 2335 01/02/21 0437 01/02/21 1143  BP: 115/66 109/66 121/66 135/73  Pulse: 100 90 86 89  Resp: '12 15 17 16  '$ Temp: 99.2 F (37.3 C) 98.7 F (37.1 C) 98.7 F (37.1 C) 98.3 F (36.8 C)  TempSrc: Oral Oral Oral Oral  SpO2: 98% 90% 96% 95%  Weight:      Height:       Physical Exam General:well appearing male in NAD Heart:RRR, no mrg Lungs:CTAB Abdomen:soft, NTND Extremities:trace LE edema Dialysis Access: LU AVF +b/t   Filed Weights   12/31/20 0609 01/01/21 0918 01/01/21 1300  Weight: 101.8 kg 103.7 kg 101.7 kg    Intake/Output Summary (Last 24 hours) at 01/02/2021 1200 Last data filed at 01/02/2021 0300 Gross per 24 hour  Intake 340 ml  Output 2000 ml  Net -1660 ml    Additional Objective Labs: Basic Metabolic Panel: Recent Labs  Lab 12/30/20 1636 12/31/20 0122 01/01/21 0146 01/02/21 0055  NA 130* 132*  --  134*  K 4.6 4.0  --  3.8  CL 95* 92*  --  98  CO2 24 29  --  29  GLUCOSE 229* 145*  --  164*  BUN 65* 24*  --  25*  CREATININE 7.26* 3.89*  --  4.26*  CALCIUM 8.8* 7.8*  --  8.6*  PHOS 3.2 2.2* 4.1 3.2   Liver Function Tests: Recent Labs  Lab 12/28/20 0116 12/29/20 0248 12/30/20 1636 12/31/20 0122  AST 90* 111*  --  74*  ALT 94* 120*  --  70*  ALKPHOS 54 72  --  57  BILITOT 0.3 0.6  --  0.3  PROT 6.2* 6.5  --  6.3*  ALBUMIN 2.9* 3.0* 3.0* 2.9*   CBC: Recent Labs  Lab 12/29/20 0248 12/30/20 0218 12/31/20 0122 01/01/21 0146 01/02/21 0055  WBC 10.6* 7.3 11.2* 7.8 9.2  NEUTROABS 8.9* 5.4 9.4* 5.1 6.4  HGB 10.0* 9.6* 10.4* 8.5* 9.1*  HCT 30.7* 29.1* 31.0* 27.2* 28.4*  MCV 91.9 91.8 90.1 93.5 93.7  PLT 180 183 207 179 189   Blood Culture    Component Value Date/Time   SDES BLOOD RIGHT  ANTECUBITAL 12/26/2020 1526   SPECREQUEST  12/26/2020 1526    BOTTLES DRAWN AEROBIC ONLY Blood Culture adequate volume   CULT  12/26/2020 1526    NO GROWTH 5 DAYS Performed at [facility], [place] [address] [place], [place] [address]    REPTSTATUS 12/31/2020 FINAL 12/26/2020 1526    CBG: Recent Labs  Lab 01/01/21 2015 01/01/21 2337 01/02/21 0442 01/02/21 0741 01/02/21 1140  GLUCAP 276* 180* 137* 147* 197*    Medications:  sodium chloride     ceFEPime (MAXIPIME) IV 2 g (01/01/21 2104)    allopurinol  100 mg Oral Daily   aspirin EC  81 mg Oral Daily   atorvastatin  40 mg Oral QHS   Chlorhexidine Gluconate Cloth  6 each Topical Q0600   cholecalciferol  2,000 Units Oral Daily   docusate sodium  100 mg Oral BID   feeding supplement (NEPRO CARB STEADY)  237 mL Oral BID BM   folic acid  1 mg Oral Daily   heparin  5,000 Units Subcutaneous Q8H   insulin aspart  0-6 Units  Subcutaneous Q4H   insulin glargine-yfgn  20 Units Subcutaneous Daily   midodrine  5 mg Oral Once per day on Mon Wed Fri   multivitamin  1 tablet Oral QHS   sodium chloride flush  3 mL Intravenous Q12H   cyanocobalamin  1,000 mcg Oral Daily    Dialysis Orders: MWF at [facility], 400/600, EDW 99.5kg, 2K/2.5Ca, AVF + TDC, heparin 5000 - Calcitriol 0.28mg PO q HD - No ESA   Assessment/Plan: 1. Chills/rigors/vomiting: blood cultures obtained from patient's outpatient HD center (12/23/20)) +enterobacter cloacae complex. Discussed findings with ID/Primary. Completed 2 doses of cefepime with another dose planned for Monday per ID.  [facility] removed TDC yesterday post HD, appreciate their assistance.  Patient will then have a line holiday over the weekend and a new TDC will be placed on Monday 9/12. Repeat BC with NGTD. 2. 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  Pain in hand improving.   3. ESRD: usual MWF schedule - Next HD on 9/12 following  TDC placement.  4. HTN/volume: BP in goal.  Not on antihypertensives.  On midodrine pre HD.  Does not appear volume overloaded.  UF as tolerated.  5. Anemia: Hgb 9.1 today. Start aranesp 69m qwk with HD Monday.  6. Secondary hyperparathyroidism: Corrected Ca ok.  Phos in goal without binders.  Continue VDRA.  7. Nutrition: Alb low, continue protein supplements. 8. T2DM  LiJen MowPA-C [facility] 01/02/2021,12:00 PM  LOS: 9 days

## 2021-01-02 NOTE — Progress Notes (Signed)
[facility] PROGRESS NOTE    Progress Note  Kevin Meyer  [id number] DOB: Jan 19, 1971 DOA: 12/24/2020 PCP: Royce Macadamia D., PA-C     Brief Narrative:   Kevin Meyer is an 50 y.o. male past medical history of essential hypertension, hyperlipidemia gout diabetes mellitus type 2 diabetic nephropathy end-stage renal disease who dialyzes Monday Wednesday and Friday, status post a right-sided tunneled catheter with a life AV fistula, chronic systolic heart failure comes into the ED at [facility] for fevers and left hand pain and 2 ulcers of the left hand that have developed over the last 3 weeks transferred to call for vascular consult    Antibiotics: IV cefepime  Microbiology data: 12/23/2020 blood culture: Enterococcus 12/26/2020 blood cultures negative till date.  Procedures: 12/25/2020 arteriogram with selective catheterization of the left brachial artery that showed minimal narrowing at the proximal end of the fistula and moderate diffuse radial disease. 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 He developed sepsis started on IV Vanco and Keflex. Blood cultures were done which were negative He continues to have intermittent fevers but no obvious source was identified.  Infectious disease was consulted, he subsequently started developing rigors blood cultures were ordered and grew enterococci on 12/23/2020. HD catheter was removed and he is now on IV cefepime and has remained afebrile since then. Continue further management per ID and vascular surgery. Patient for [facility] placement on 01/04/2021.  Elevated LFTs: There was concern of exposure to hepatitis B hepatitis panel has been negative. And seizures  are trending down elevation of LFTs was likely due to sepsis.  End-stage renal disease on hemodialysis: Further management per renal.  Hyperkalemia: Noted.  Hypertension, chronic: He is on midodrine.  Diabetes mellitus type 2 with diabetic nephropathy: With an A1c 7.2, continue sliding scale insulin.  Hyperlipidemia: Continue statins.   DVT prophylaxis: lovenox Family Communication:none Status is: Inpatient  Remains inpatient appropriate because:Hemodynamically unstable  Dispo: The patient is from: Home              Anticipated d/c is to: Home              Patient currently is not medically stable to d/c.   Difficult to place patient No        Code Status:     Code Status Orders  (From admission, onward)           Start     Ordered   12/24/20 1428  Full code  Continuous        12/24/20 1432           Code Status History     Date Active Date Inactive Code Status Order ID Comments User Context   06/22/2020 2339 06/29/2020 2108 Full Code [id number]  Bernadette Hoit, DO Inpatient   09/08/2017 1736 09/10/2017 1414 Full Code [id number]  Barton Dubois, MD Inpatient   08/25/2017 1945 08/26/2017 1738 Full Code [id number]  Phillips Grout, MD ED         IV Access:   Peripheral IV   Procedures and diagnostic studies:   No results found.   Medical Consultants:   None.   Subjective:    Kevin Meyer feels great hand pain is improved.  Objective:    Vitals:   01/01/21 1755 01/01/21  2016 01/01/21 2335 01/02/21 0437  BP: 113/65 115/66 109/66 121/66  Pulse: (!) 102 100 90 86  Resp: '18 12 15 17  '$ Temp: 98.4 F (36.9 C) 99.2 F (37.3 C) 98.7 F (37.1 C) 98.7 F (37.1 C)  TempSrc: Oral Oral Oral Oral  SpO2: 96% 98% 90% 96%  Weight:      Height:       SpO2: 96 % O2 Flow Rate (L/min): 1 L/min FiO2 (%): 21 %   Intake/Output Summary (Last 24 hours) at 01/02/2021 0816 Last data filed at 01/02/2021 0300 Gross per 24 hour  Intake 340 ml  Output  2000 ml  Net -1660 ml   Filed Weights   12/31/20 0609 01/01/21 0918 01/01/21 1300  Weight: 101.8 kg 103.7 kg 101.7 kg    Exam: General exam: In no acute distress. Respiratory system: Good air movement and clear to auscultation. Cardiovascular system: S1 & S2 heard, RRR. No JVD. Gastrointestinal system: Abdomen is nondistended, soft and nontender.  Extremities: No pedal edema. Skin: No rashes, lesions or ulcers Psychiatry: Judgement and insight appear normal. Mood & affect appropriate.    Data Reviewed:    Labs: Basic Metabolic Panel: Recent Labs  Lab 12/28/20 0116 12/29/20 0248 12/30/20 0218 12/30/20 1636 12/31/20 0122 01/01/21 0146 01/02/21 0055  NA 133* 133*  --  130* 132*  --  134*  K 3.7 4.7  --  4.6 4.0  --  3.8  CL 94* 96*  --  95* 92*  --  98  CO2 28 29  --  24 29  --  29  GLUCOSE 149* 241*  --  229* 145*  --  164*  BUN 66* 38*  --  65* 24*  --  25*  CREATININE 7.63* 5.56*  --  7.26* 3.89*  --  4.26*  CALCIUM 8.7* 8.8*  --  8.8* 7.8*  --  8.6*  MG 2.4 2.1 2.3  --  2.2 2.4 2.4  PHOS 5.1* 2.2* 3.0 3.2 2.2* 4.1 3.2   GFR Estimated Creatinine Clearance: 23.2 mL/min (A) (by C-G formula based on SCr of 4.26 mg/dL (H)). Liver Function Tests: Recent Labs  Lab 12/27/20 0149 12/28/20 0116 12/29/20 0248 12/30/20 1636 12/31/20 0122  AST 84* 90* 111*  --  74*  ALT 77* 94* 120*  --  70*  ALKPHOS 63 54 72  --  57  BILITOT 0.6 0.3 0.6  --  0.3  PROT 6.5 6.2* 6.5  --  6.3*  ALBUMIN 3.1* 2.9* 3.0* 3.0* 2.9*   No results for input(s): LIPASE, AMYLASE in the last 168 hours. No results for input(s): AMMONIA in the last 168 hours. Coagulation profile No results for input(s): INR, PROTIME in the last 168 hours. COVID-19 Labs  No results for input(s): DDIMER, FERRITIN, LDH, CRP in the last 72 hours.  Lab Results  Component Value Date   SARSCOV2NAA NEGATIVE 12/24/2020   [place] NEGATIVE 09/01/2020   [place] NEGATIVE 08/18/2020   [place] NEGATIVE  06/22/2020    CBC: Recent Labs  Lab 12/29/20 0248 12/30/20 0218 12/31/20 0122 01/01/21 0146 01/02/21 0055  WBC 10.6* 7.3 11.2* 7.8 9.2  NEUTROABS 8.9* 5.4 9.4* 5.1 6.4  HGB 10.0* 9.6* 10.4* 8.5* 9.1*  HCT 30.7* 29.1* 31.0* 27.2* 28.4*  MCV 91.9 91.8 90.1 93.5 93.7  PLT 180 183 207 179 189   Cardiac Enzymes: No results for input(s): CKTOTAL, CKMB, CKMBINDEX, TROPONINI in the last 168 hours. BNP (last 3 results) No results for input(s): PROBNP in  the last 8760 hours. CBG: Recent Labs  Lab 01/01/21 1750 01/01/21 2015 01/01/21 2337 01/02/21 0442 01/02/21 0741  GLUCAP 201* 276* 180* 137* 147*   D-Dimer: No results for input(s): DDIMER in the last 72 hours. Hgb A1c: No results for input(s): HGBA1C in the last 72 hours. Lipid Profile: No results for input(s): CHOL, HDL, LDLCALC, TRIG, CHOLHDL, LDLDIRECT in the last 72 hours. Thyroid function studies: No results for input(s): TSH, T4TOTAL, T3FREE, THYROIDAB in the last 72 hours.  Invalid input(s): FREET3 Anemia work up: No results for input(s): VITAMINB12, FOLATE, FERRITIN, TIBC, IRON, RETICCTPCT in the last 72 hours. Sepsis Labs: Recent Labs  Lab 12/26/20 1337 12/27/20 0149 12/30/20 0218 12/31/20 0122 01/01/21 0146 01/02/21 0055  WBC  --    < > 7.3 11.2* 7.8 9.2  LATICACIDVEN 1.2  --   --   --   --   --    < > = values in this interval not displayed.   Microbiology Recent Results (from the past 240 hour(s))  Blood Culture (routine x 2)     Status: None   Collection Time: 12/24/20 10:13 AM   Specimen: BLOOD RIGHT WRIST  Result Value Ref Range Status   Specimen Description BLOOD RIGHT WRIST  Final   Special Requests   Final    BOTTLES DRAWN AEROBIC AND ANAEROBIC Blood Culture adequate volume   Culture   Final    NO GROWTH 5 DAYS Performed at [facility], [address], [place], [place] [address]    Report Status 12/29/2020 FINAL  Final  Blood Culture (routine x 2)     Status: None   Collection Time:  12/24/20 10:14 AM   Specimen: Right Antecubital; Blood  Result Value Ref Range Status   Specimen Description RIGHT ANTECUBITAL  Final   Special Requests   Final    BOTTLES DRAWN AEROBIC AND ANAEROBIC Blood Culture adequate volume   Culture   Final    NO GROWTH 5 DAYS Performed at [facility], [address] [place], [place] [address]    Report Status 12/29/2020 FINAL  Final  Resp Panel by RT-PCR (Flu A&B, Covid) Nasopharyngeal Swab     Status: None   Collection Time: 12/24/20 11:08 AM   Specimen: Nasopharyngeal Swab; Nasopharyngeal(NP) swabs in vial transport medium  Result Value Ref Range Status   SARS Coronavirus 2 by RT PCR NEGATIVE NEGATIVE Final    Comment: (NOTE) SARS-CoV-2 target nucleic acids are NOT DETECTED.  The SARS-CoV-2 RNA is generally detectable in upper respiratory specimens during the acute phase of infection. The lowest concentration of SARS-CoV-2 viral copies this assay can detect is 138 copies/mL. A negative result does not preclude SARS-Cov-2 infection and should not be used as the sole basis for treatment or other patient management decisions. A negative result may occur with  improper specimen collection/handling, submission of specimen other than nasopharyngeal swab, presence of viral mutation(s) within the areas targeted by this assay, and inadequate number of viral copies(<138 copies/mL). A negative result must be combined with clinical observations, patient history, and epidemiological information. The expected result is Negative.  Fact Sheet for Patients:  EntrepreneurPulse.com.au  Fact Sheet for Healthcare Providers:  IncredibleEmployment.be  This test is no t yet approved or cleared by the [place] FDA and  has been authorized for detection and/or diagnosis of SARS-CoV-2 by FDA under an Emergency Use Authorization (EUA). This EUA will remain  in effect (meaning this test can be used) for the duration of  the  COVID-19 declaration under Section 564(b)(1) of the Act, 21 U.S.C.section 360bbb-3(b)(1), unless the authorization is terminated  or revoked sooner.       Influenza A by PCR NEGATIVE NEGATIVE Final   Influenza B by PCR NEGATIVE NEGATIVE Final    Comment: (NOTE) The Xpert Xpress SARS-CoV-2/FLU/RSV plus assay is intended as an aid in the diagnosis of influenza from Nasopharyngeal swab specimens and should not be used as a sole basis for treatment. Nasal washings and aspirates are unacceptable for Xpert Xpress SARS-CoV-2/FLU/RSV testing.  Fact Sheet for Patients: EntrepreneurPulse.com.au  Fact Sheet for Healthcare Providers: IncredibleEmployment.be  This test is not yet approved or cleared by the [place] FDA and has been authorized for detection and/or diagnosis of SARS-CoV-2 by FDA under an Emergency Use Authorization (EUA). This EUA will remain in effect (meaning this test can be used) for the duration of the COVID-19 declaration under Section 564(b)(1) of the Act, 21 U.S.C. section 360bbb-3(b)(1), unless the authorization is terminated or revoked.  Performed at [facility], [address], [place], [place] [address]   Culture, blood (routine x 2)     Status: None   Collection Time: 12/26/20  1:52 PM   Specimen: BLOOD  Result Value Ref Range Status   Specimen Description BLOOD BLOOD LEFT FOREARM  Final   Special Requests   Final    BOTTLES DRAWN AEROBIC AND ANAEROBIC Blood Culture adequate volume   Culture   Final    NO GROWTH 5 DAYS Performed at [facility], [place] [address], [place], [place] [address]    Report Status 12/31/2020 FINAL  Final  Culture, blood (routine x 2)     Status: None   Collection Time: 12/26/20  3:26 PM   Specimen: BLOOD  Result Value Ref Range Status   Specimen Description BLOOD RIGHT ANTECUBITAL  Final   Special Requests   Final    BOTTLES DRAWN AEROBIC ONLY Blood Culture adequate volume    Culture   Final    NO GROWTH 5 DAYS Performed at [facility], [place] [address] [place], [place] [address]    Report Status 12/31/2020 FINAL  Final  Surgical pcr screen     Status: None   Collection Time: 12/29/20 12:35 PM   Specimen: Nasal Mucosa; Nasal Swab  Result Value Ref Range Status   MRSA, PCR NEGATIVE NEGATIVE Final   Staphylococcus aureus NEGATIVE NEGATIVE Final    Comment: (NOTE) The Xpert SA Assay (FDA approved for NASAL specimens in patients 4 years of age and older), is one component of a comprehensive surveillance program. It is not intended to diagnose infection nor to guide or monitor treatment. Performed at [facility], [place] [address] [place], [place] [address]      Medications:    allopurinol  100 mg Oral Daily   aspirin EC  81 mg Oral Daily   atorvastatin  40 mg Oral QHS   Chlorhexidine Gluconate Cloth  6 each Topical Q0600   cholecalciferol  2,000 Units Oral Daily   docusate sodium  100 mg Oral BID   feeding supplement (NEPRO CARB STEADY)  237 mL Oral BID BM   folic acid  1 mg Oral Daily   heparin  5,000 Units Subcutaneous Q8H   insulin aspart  0-6 Units Subcutaneous Q4H   insulin glargine-yfgn  20 Units Subcutaneous Daily   midodrine  5 mg Oral Once per day on Mon Wed Fri   multivitamin  1 tablet Oral QHS  sodium chloride flush  3 mL Intravenous Q12H   cyanocobalamin  1,000 mcg Oral Daily   Continuous Infusions:  sodium chloride     ceFEPime (MAXIPIME) IV 2 g (01/01/21 2104)      LOS: 9 days   Charlynne Cousins  [facility]  01/02/2021, 8:16 AM

## 2021-01-03 DIAGNOSIS — S61402A Unspecified open wound of left hand, initial encounter: Secondary | ICD-10-CM | POA: Diagnosis not present

## 2021-01-03 DIAGNOSIS — R509 Fever, unspecified: Secondary | ICD-10-CM | POA: Diagnosis not present

## 2021-01-03 DIAGNOSIS — L089 Local infection of the skin and subcutaneous tissue, unspecified: Secondary | ICD-10-CM | POA: Diagnosis not present

## 2021-01-03 LAB — CBC WITH DIFFERENTIAL/PLATELET
Abs Immature Granulocytes: 0.25 10*3/uL — ABNORMAL HIGH (ref 0.00–0.07)
Basophils Absolute: 0.1 10*3/uL (ref 0.0–0.1)
Basophils Relative: 1 %
Eosinophils Absolute: 0.2 10*3/uL (ref 0.0–0.5)
Eosinophils Relative: 2 %
HCT: 27.8 % — ABNORMAL LOW (ref 39.0–52.0)
Hemoglobin: 9.1 g/dL — ABNORMAL LOW (ref 13.0–17.0)
Immature Granulocytes: 4 %
Lymphocytes Relative: 21 %
Lymphs Abs: 1.5 10*3/uL (ref 0.7–4.0)
MCH: 30.1 pg (ref 26.0–34.0)
MCHC: 32.7 g/dL (ref 30.0–36.0)
MCV: 92.1 fL (ref 80.0–100.0)
Monocytes Absolute: 0.7 10*3/uL (ref 0.1–1.0)
Monocytes Relative: 10 %
Neutro Abs: 4.5 10*3/uL (ref 1.7–7.7)
Neutrophils Relative %: 62 %
Platelets: 193 10*3/uL (ref 150–400)
RBC: 3.02 MIL/uL — ABNORMAL LOW (ref 4.22–5.81)
RDW: 15.6 % — ABNORMAL HIGH (ref 11.5–15.5)
WBC: 7.2 10*3/uL (ref 4.0–10.5)
nRBC: 0 % (ref 0.0–0.2)

## 2021-01-03 LAB — GLUCOSE, CAPILLARY
Glucose-Capillary: 147 mg/dL — ABNORMAL HIGH (ref 70–99)
Glucose-Capillary: 148 mg/dL — ABNORMAL HIGH (ref 70–99)
Glucose-Capillary: 148 mg/dL — ABNORMAL HIGH (ref 70–99)
Glucose-Capillary: 172 mg/dL — ABNORMAL HIGH (ref 70–99)
Glucose-Capillary: 204 mg/dL — ABNORMAL HIGH (ref 70–99)
Glucose-Capillary: 210 mg/dL — ABNORMAL HIGH (ref 70–99)
Glucose-Capillary: 254 mg/dL — ABNORMAL HIGH (ref 70–99)

## 2021-01-03 LAB — PHOSPHORUS: Phosphorus: 4.4 mg/dL (ref 2.5–4.6)

## 2021-01-03 LAB — MAGNESIUM: Magnesium: 2.5 mg/dL — ABNORMAL HIGH (ref 1.7–2.4)

## 2021-01-03 MED ORDER — CEFAZOLIN SODIUM-DEXTROSE 2-4 GM/100ML-% IV SOLN: 2.0000 g | INTRAVENOUS | Status: DC

## 2021-01-03 MED ORDER — INSULIN ASPART 100 UNIT/ML IJ SOLN
0.0000 [IU] | Freq: Three times a day (TID) | INTRAMUSCULAR | Status: DC
  Administered 2021-01-03: 2 [IU] via SUBCUTANEOUS
  Administered 2021-01-03: 3 [IU] via SUBCUTANEOUS
  Administered 2021-01-04: 1 [IU] via SUBCUTANEOUS

## 2021-01-03 MED ORDER — CHLORHEXIDINE GLUCONATE CLOTH 2 % EX PADS
6.0000 | MEDICATED_PAD | Freq: Every day | CUTANEOUS | Status: DC
  Administered 2021-01-04: 6 via TOPICAL

## 2021-01-03 MED ORDER — INSULIN ASPART 100 UNIT/ML IJ SOLN
0.0000 [IU] | Freq: Every day | INTRAMUSCULAR | Status: DC
  Administered 2021-01-03: 2 [IU] via SUBCUTANEOUS

## 2021-01-03 MED ORDER — INSULIN ASPART 100 UNIT/ML IJ SOLN
2.0000 [IU] | Freq: Three times a day (TID) | INTRAMUSCULAR | Status: DC
  Administered 2021-01-03 – 2021-01-04 (×3): 2 [IU] via SUBCUTANEOUS

## 2021-01-03 NOTE — Progress Notes (Addendum)
  Progress Note    01/03/2021 8:10 AM 4 Days Post-Op  Subjective:  left hand continues to improve.  afebrile  Vitals:   01/03/21 0004 01/03/21 0420  BP: (!) 147/76 121/68  Pulse: 92 87  Resp: 20 18  Temp: 98.9 F (37.2 C) 98.9 F (37.2 C)  SpO2: 93% 100%    Physical Exam: General:   no distress Lungs:  non labored Incisions:  clean and dry with steri strips in place Extremities:  left hand is warm  CBC    Component Value Date/Time   WBC 7.2 01/03/2021 0043   RBC 3.02 (L) 01/03/2021 0043   HGB 9.1 (L) 01/03/2021 0043   HCT 27.8 (L) 01/03/2021 0043   PLT 193 01/03/2021 0043   MCV 92.1 01/03/2021 0043   MCH 30.1 01/03/2021 0043   MCHC 32.7 01/03/2021 0043   RDW 15.6 (H) 01/03/2021 0043   LYMPHSABS 1.5 01/03/2021 0043   MONOABS 0.7 01/03/2021 0043   EOSABS 0.2 01/03/2021 0043   BASOSABS 0.1 01/03/2021 0043    BMET    Component Value Date/Time   NA 134 (L) 01/02/2021 0055   K 3.8 01/02/2021 0055   CL 98 01/02/2021 0055   CO2 29 01/02/2021 0055   GLUCOSE 164 (H) 01/02/2021 0055   BUN 25 (H) 01/02/2021 0055   CREATININE 4.26 (H) 01/02/2021 0055   CALCIUM 8.6 (L) 01/02/2021 0055   GFRNONAA 16 (L) 01/02/2021 0055   GFRAA 50 (L) 09/10/2017 0449    INR    Component Value Date/Time   INR 1.0 12/24/2020 1013    No intake or output data in the 24 hours ending 01/03/21 0810   Assessment/Plan:  50 y.o. male is s/p:  LUE DRIL procedure for steal syndrome   4 Days Post-Op   -pt doing well this morning and hand continues to improve.   -pt for shower today while [facility] holiday.  -for [facility] tomorrow.  Npo after MN/consent/labs ordered.   -pt has f/u appt with Dr. Donnetta Hutching in [place] on 10/12.   Leontine Locket, PA-C [facility] [phone] 01/03/2021 8:10 AM  VASCULAR STAFF ADDENDUM: I have independently interviewed and examined the patient. I agree with the above. Doing well s/p DRIL - both bypass and fistula are patent.  [facility]  tomorrow.  Yevonne Aline. Stanford Breed, MD [facility] Phone Number: [phone] 01/03/2021 9:17 AM

## 2021-01-03 NOTE — Progress Notes (Addendum)
[facility] Progress Note   Subjective:   Patient seen and examined at bedside.  Left hand feeling better, improved warmth and movement.  Overall feeling good.  Excited to take a shower today prior to getting HD cath replaced tomorrow.  Denies CP, SOB, edema, orthopnea, fever, chills, abdominal pain and n/v/d.   Objective Vitals:   01/03/21 0004 01/03/21 0420 01/03/21 0818 01/03/21 1137  BP: (!) 147/76 121/68 135/78 117/70  Pulse: 92 87 96 92  Resp: '20 18 18 16  '$ Temp: 98.9 F (37.2 C) 98.9 F (37.2 C) 98.7 F (37.1 C) 99 F (37.2 C)  TempSrc: Oral Oral Oral Oral  SpO2: 93% 100% 100% 99%  Weight:  105.1 kg    Height:       Physical Exam General:well appearing, pleasant male in NAD Heart:RRR, no mrg Lungs:CTAB, nml WOB on RA Abdomen:soft, NTND Extremities:trace LE edema Dialysis Access: LU AVF +b/t   Filed Weights   01/01/21 0918 01/01/21 1300 01/03/21 0420  Weight: 103.7 kg 101.7 kg 105.1 kg    Intake/Output Summary (Last 24 hours) at 01/03/2021 1228 Last data filed at 01/03/2021 0830 Gross per 24 hour  Intake 240 ml  Output --  Net 240 ml    Additional Objective Labs: Basic Metabolic Panel: Recent Labs  Lab 12/30/20 1636 12/31/20 0122 01/01/21 0146 01/02/21 0055 01/03/21 0043  NA 130* 132*  --  134*  --   K 4.6 4.0  --  3.8  --   CL 95* 92*  --  98  --   CO2 24 29  --  29  --   GLUCOSE 229* 145*  --  164*  --   BUN 65* 24*  --  25*  --   CREATININE 7.26* 3.89*  --  4.26*  --   CALCIUM 8.8* 7.8*  --  8.6*  --   PHOS 3.2 2.2* 4.1 3.2 4.4   Liver Function Tests: Recent Labs  Lab 12/28/20 0116 12/29/20 0248 12/30/20 1636 12/31/20 0122  AST 90* 111*  --  74*  ALT 94* 120*  --  70*  ALKPHOS 54 72  --  57  BILITOT 0.3 0.6  --  0.3  PROT 6.2* 6.5  --  6.3*  ALBUMIN 2.9* 3.0* 3.0* 2.9*   CBC: Recent Labs  Lab 12/30/20 0218 12/31/20 0122 01/01/21 0146 01/02/21 0055 01/03/21 0043  WBC 7.3 11.2* 7.8 9.2 7.2  NEUTROABS 5.4 9.4*  5.1 6.4 4.5  HGB 9.6* 10.4* 8.5* 9.1* 9.1*  HCT 29.1* 31.0* 27.2* 28.4* 27.8*  MCV 91.8 90.1 93.5 93.7 92.1  PLT 183 207 179 189 193   Blood Culture    Component Value Date/Time   SDES CATH TIP 01/01/2021 1507   SPECREQUEST NONE 01/01/2021 1507   CULT  01/01/2021 1507    NO GROWTH < 24 HOURS Performed at [facility], [address], [place],  [address]    REPTSTATUS PENDING 01/01/2021 1507    CBG: Recent Labs  Lab 01/02/21 2039 01/03/21 0007 01/03/21 0425 01/03/21 0814 01/03/21 1134  GLUCAP 180* 172* 148* 147* 254*    Medications:  sodium chloride     [START ON 01/04/2021]  ceFAZolin (ANCEF) IV     ceFEPime (MAXIPIME) IV 2 g (01/01/21 2104)    allopurinol  100 mg Oral Daily   aspirin EC  81 mg Oral Daily   atorvastatin  40 mg Oral QHS   [START ON 01/04/2021] calcitRIOL  0.5 mcg Oral Q  M,W,F-HD   Chlorhexidine Gluconate Cloth  6 each Topical Q0600   cholecalciferol  2,000 Units Oral Daily   [START ON 01/04/2021] darbepoetin (ARANESP) injection - DIALYSIS  60 mcg Intravenous Q Mon-HD   docusate sodium  100 mg Oral BID   feeding supplement (NEPRO CARB STEADY)  237 mL Oral BID BM   folic acid  1 mg Oral Daily   heparin  5,000 Units Subcutaneous Q8H   insulin aspart  0-5 Units Subcutaneous QHS   insulin aspart  0-6 Units Subcutaneous TID WC   insulin aspart  2 Units Subcutaneous TID WC   insulin glargine-yfgn  20 Units Subcutaneous Daily   midodrine  5 mg Oral Once per day on Mon Wed Fri   multivitamin  1 tablet Oral QHS   sodium chloride flush  3 mL Intravenous Q12H   cyanocobalamin  1,000 mcg Oral Daily    Dialysis Orders: MWF at [facility] 4hr, 400/600, EDW 99.5kg, 2K/2.5Ca, AVF + TDC, heparin 5000 - Calcitriol 0.70mg PO q HD - No ESA   Assessment/Plan: 1. Chills/rigors/vomiting: blood cultures obtained from patient's outpatient HD center (12/23/20)) +enterobacter cloacae complex. Discussed findings with ID/Primary. Getting IV cefipime per ID , 2  gm after HD MWF.  [facility].  [facility] line holiday [facility] Repeat BC with NGTD. 2. 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  Left hand improving. 3. ESRD: usual MWF schedule - Next HD on 9/12 following TDC placement.  4. HTN/volume: BP in goal.  Not on antihypertensives.  On midodrine pre HD.  Does not appear volume overloaded.  UF as tolerated.  5. Anemia: Last Hgb 9.1. Start aranesp 616m qwk with HD Monday.  6. Secondary hyperparathyroidism: Corrected Ca ok.  Phos in goal without binders.  Continue VDRA.  7. Nutrition: Alb low, continue protein supplements. 8. T2DM  LiJen MowPA-C [facility] 01/03/2021,12:28 PM  LOS: 10 days   Pt seen, examined and agree w A/P as above.  RoKelly SplinterMD 01/03/2021, 3:45 PM

## 2021-01-03 NOTE — Progress Notes (Signed)
[facility] PROGRESS NOTE    Progress Note  Kevin Meyer  [id number] DOB: 1970-08-19 DOA: 12/24/2020 PCP: Royce Macadamia D., PA-C     Brief Narrative:   Kevin Meyer is an 50 y.o. male past medical history of essential hypertension, hyperlipidemia gout diabetes mellitus type 2 diabetic nephropathy end-stage renal disease who dialyzes Monday Wednesday and Friday, status post a right-sided tunneled catheter with a life AV fistula, chronic systolic heart failure comes into the ED at [facility] for fevers and left hand pain and 2 ulcers of the left hand that have developed over the last 3 weeks transferred to call for Vascular surgery was consulted angiogram performed the patient underwent DRIL procedure. He developed sepsis started on IV Vanco and Keflex. He continues to have intermittent fevers but no obvious source was identified.  Infectious disease was consulted, he subsequently started developing rigors blood cultures were ordered and grew enterococci on 12/23/2020. HD catheter was removed and he is now on IV cefepime and has remained afebrile since then.  Antibiotics: IV cefepime  Microbiology data: 12/23/2020 blood culture: Enterococcus 12/26/2020 blood cultures negative till date.  Procedures: 12/25/2020 arteriogram with selective catheterization of the left brachial artery that showed minimal narrowing at the proximal end of the fistula and moderate diffuse radial disease. 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 Patient for [facility] placement on 01/04/2021.  Elevated LFTs: There was concern of exposure to hepatitis B hepatitis panel has been  negative. And seizures are trending down elevation of LFTs was likely due to sepsis.  End-stage renal disease on hemodialysis: Further management per renal.  Hyperkalemia: Noted.  Hypertension, chronic: He is on midodrine.  Diabetes mellitus type 2 with diabetic nephropathy: With an A1c 7.2, continue sliding scale insulin.  Hyperlipidemia: Continue statins.   DVT prophylaxis: lovenox Family Communication:none Status is: Inpatient  Remains inpatient appropriate because:Hemodynamically unstable  Dispo: The patient is from: Home              Anticipated d/c is to: Home              Patient currently is not medically stable to d/c.   Difficult to place patient No    Code Status:     Code Status Orders  (From admission, onward)           Start     Ordered   12/24/20 1428  Full code  Continuous        12/24/20 1432           Code Status History     Date Active Date Inactive Code Status Order ID Comments User Context   06/22/2020 2339 06/29/2020 2108 Full Code [id number]  Bernadette Hoit, DO Inpatient   09/08/2017 1736 09/10/2017 1414 Full Code [id number]  Barton Dubois, MD Inpatient   08/25/2017 1945 08/26/2017 1738 Full Code [id number]  Phillips Grout, MD ED         IV Access:   Peripheral IV   Procedures and diagnostic studies:   No results found.   Medical Consultants:   None.   Subjective:    Kevin Meyer no new complaints this morning tolerating his diet.  Objective:    Vitals:   01/02/21 1600 01/02/21 2037  01/03/21 0004 01/03/21 0420  BP: (!) 142/82 (!) 142/66 (!) 147/76 121/68  Pulse: 88 85 92 87  Resp: '20 17 20 18  '$ Temp: 98.2 F (36.8 C) 98.5 F (36.9 C) 98.9 F (37.2 C) 98.9 F (37.2 C)  TempSrc: Oral Oral Oral Oral  SpO2: 96% 96% 93% 100%  Weight:    105.1 kg  Height:       SpO2: 100 % O2 Flow Rate (L/min): 1 L/min FiO2 (%): 21 %  No intake or output data in the 24 hours ending 01/03/21 0756  Filed Weights    01/01/21 0918 01/01/21 1300 01/03/21 0420  Weight: 103.7 kg 101.7 kg 105.1 kg    Exam: General exam: In no acute distress. Respiratory system: Good air movement and clear to auscultation. Cardiovascular system: S1 & S2 heard, RRR. No JVD. Gastrointestinal system: Abdomen is nondistended, soft and nontender.  Extremities: No pedal edema. Skin: No rashes, lesions or ulcers Psychiatry: Judgement and insight appear normal. Mood & affect appropriate. Data Reviewed:    Labs: Basic Metabolic Panel: Recent Labs  Lab 12/28/20 0116 12/29/20 0248 12/30/20 [id number] 12/30/20 1636 12/31/20 0122 01/01/21 0146 01/02/21 0055 01/03/21 0043  NA 133* 133*  --  130* 132*  --  134*  --   K 3.7 4.7  --  4.6 4.0  --  3.8  --   CL 94* 96*  --  95* 92*  --  98  --   CO2 28 29  --  24 29  --  29  --   GLUCOSE 149* 241*  --  229* 145*  --  164*  --   BUN 66* 38*  --  65* 24*  --  25*  --   CREATININE 7.63* 5.56*  --  7.26* 3.89*  --  4.26*  --   CALCIUM 8.7* 8.8*  --  8.8* 7.8*  --  8.6*  --   MG 2.4 2.1 2.3  --  2.2 2.4 2.4 2.5*  PHOS 5.1* 2.2* 3.0 3.2 2.2* 4.1 3.2 4.4    GFR Estimated Creatinine Clearance: 23.6 mL/min (A) (by C-G formula based on SCr of 4.26 mg/dL (H)). Liver Function Tests: Recent Labs  Lab 12/28/20 0116 12/29/20 0248 12/30/20 1636 12/31/20 0122  AST 90* 111*  --  74*  ALT 94* 120*  --  70*  ALKPHOS 54 72  --  57  BILITOT 0.3 0.6  --  0.3  PROT 6.2* 6.5  --  6.3*  ALBUMIN 2.9* 3.0* 3.0* 2.9*    No results for input(s): LIPASE, AMYLASE in the last 168 hours. No results for input(s): AMMONIA in the last 168 hours. Coagulation profile No results for input(s): INR, PROTIME in the last 168 hours. COVID-19 Labs  No results for input(s): DDIMER, FERRITIN, LDH, CRP in the last 72 hours.  Lab Results  Component Value Date   SARSCOV2NAA NEGATIVE 12/24/2020   [place] NEGATIVE 09/01/2020   [place] NEGATIVE 08/18/2020   [place] NEGATIVE 06/22/2020     CBC: Recent Labs  Lab 12/30/20 0218 12/31/20 0122 01/01/21 0146 01/02/21 0055 01/03/21 0043  WBC 7.3 11.2* 7.8 9.2 7.2  NEUTROABS 5.4 9.4* 5.1 6.4 4.5  HGB 9.6* 10.4* 8.5* 9.1* 9.1*  HCT 29.1* 31.0* 27.2* 28.4* 27.8*  MCV 91.8 90.1 93.5 93.7 92.1  PLT 183 207 179 189 193    Cardiac Enzymes: No results for input(s): CKTOTAL, CKMB, CKMBINDEX, TROPONINI in the last 168 hours. BNP (last 3 results) No results for input(s):  PROBNP in the last 8760 hours. CBG: Recent Labs  Lab 01/02/21 1140 01/02/21 1557 01/02/21 2039 01/03/21 0007 01/03/21 0425  GLUCAP 197* 249* 180* 172* 148*    D-Dimer: No results for input(s): DDIMER in the last 72 hours. Hgb A1c: No results for input(s): HGBA1C in the last 72 hours. Lipid Profile: No results for input(s): CHOL, HDL, LDLCALC, TRIG, CHOLHDL, LDLDIRECT in the last 72 hours. Thyroid function studies: No results for input(s): TSH, T4TOTAL, T3FREE, THYROIDAB in the last 72 hours.  Invalid input(s): FREET3 Anemia work up: No results for input(s): VITAMINB12, FOLATE, FERRITIN, TIBC, IRON, RETICCTPCT in the last 72 hours. Sepsis Labs: Recent Labs  Lab 12/31/20 0122 01/01/21 0146 01/02/21 0055 01/03/21 0043  WBC 11.2* 7.8 9.2 7.2    Microbiology Recent Results (from the past 240 hour(s))  Blood Culture (routine x 2)     Status: None   Collection Time: 12/24/20 10:13 AM   Specimen: BLOOD RIGHT WRIST  Result Value Ref Range Status   Specimen Description BLOOD RIGHT WRIST  Final   Special Requests   Final    BOTTLES DRAWN AEROBIC AND ANAEROBIC Blood Culture adequate volume   Culture   Final    NO GROWTH 5 DAYS Performed at [facility], [address] [place], [place] [address]    Report Status 12/29/2020 FINAL  Final  Blood Culture (routine x 2)     Status: None   Collection Time: 12/24/20 10:14 AM   Specimen: Right Antecubital; Blood  Result Value Ref Range Status   Specimen Description RIGHT ANTECUBITAL  Final    Special Requests   Final    BOTTLES DRAWN AEROBIC AND ANAEROBIC Blood Culture adequate volume   Culture   Final    NO GROWTH 5 DAYS Performed at [facility], [address], [place], [place] [id number]    Report Status 12/29/2020 FINAL  Final  Resp Panel by RT-PCR (Flu A&B, Covid) Nasopharyngeal Swab     Status: None   Collection Time: 12/24/20 11:08 AM   Specimen: Nasopharyngeal Swab; Nasopharyngeal(NP) swabs in vial transport medium  Result Value Ref Range Status   SARS Coronavirus 2 by RT PCR NEGATIVE NEGATIVE Final    Comment: (NOTE) SARS-CoV-2 target nucleic acids are NOT DETECTED.  The SARS-CoV-2 RNA is generally detectable in upper respiratory specimens during the acute phase of infection. The lowest concentration of SARS-CoV-2 viral copies this assay can detect is 138 copies/mL. A negative result does not preclude SARS-Cov-2 infection and should not be used as the sole basis for treatment or other patient management decisions. A negative result may occur with  improper specimen collection/handling, submission of specimen other than nasopharyngeal swab, presence of viral mutation(s) within the areas targeted by this assay, and inadequate number of viral copies(<138 copies/mL). A negative result must be combined with clinical observations, patient history, and epidemiological information. The expected result is Negative.  Fact Sheet for Patients:  EntrepreneurPulse.com.au  Fact Sheet for Healthcare Providers:  IncredibleEmployment.be  This test is no t yet approved or cleared by the [place] FDA and  has been authorized for detection and/or diagnosis of SARS-CoV-2 by FDA under an Emergency Use Authorization (EUA). This EUA will remain  in effect (meaning this test can be used) for the duration of the COVID-19 declaration under Section 564(b)(1) of the Act, 21 U.S.C.section 360bbb-3(b)(1), unless the authorization is terminated  or  revoked sooner.       Influenza A by PCR NEGATIVE NEGATIVE Final   Influenza  B by PCR NEGATIVE NEGATIVE Final    Comment: (NOTE) The Xpert Xpress SARS-CoV-2/FLU/RSV plus assay is intended as an aid in the diagnosis of influenza from Nasopharyngeal swab specimens and should not be used as a sole basis for treatment. Nasal washings and aspirates are unacceptable for Xpert Xpress SARS-CoV-2/FLU/RSV testing.  Fact Sheet for Patients: EntrepreneurPulse.com.au  Fact Sheet for Healthcare Providers: IncredibleEmployment.be  This test is not yet approved or cleared by the [place] FDA and has been authorized for detection and/or diagnosis of SARS-CoV-2 by FDA under an Emergency Use Authorization (EUA). This EUA will remain in effect (meaning this test can be used) for the duration of the COVID-19 declaration under Section 564(b)(1) of the Act, 21 U.S.C. section 360bbb-3(b)(1), unless the authorization is terminated or revoked.  Performed at [facility], [address] [place], [place] [address]   Culture, blood (routine x 2)     Status: None   Collection Time: 12/26/20  1:52 PM   Specimen: BLOOD  Result Value Ref Range Status   Specimen Description BLOOD BLOOD LEFT FOREARM  Final   Special Requests   Final    BOTTLES DRAWN AEROBIC AND ANAEROBIC Blood Culture adequate volume   Culture   Final    NO GROWTH 5 DAYS Performed at [facility], [place] [address] [place], [place] [address]    Report Status 12/31/2020 FINAL  Final  Culture, blood (routine x 2)     Status: None   Collection Time: 12/26/20  3:26 PM   Specimen: BLOOD  Result Value Ref Range Status   Specimen Description BLOOD RIGHT ANTECUBITAL  Final   Special Requests   Final    BOTTLES DRAWN AEROBIC ONLY Blood Culture adequate volume   Culture   Final    NO GROWTH 5 DAYS Performed at [facility], [place] [address], [place], [place] [address]    Report Status 12/31/2020  FINAL  Final  Surgical pcr screen     Status: None   Collection Time: 12/29/20 12:35 PM   Specimen: Nasal Mucosa; Nasal Swab  Result Value Ref Range Status   MRSA, PCR NEGATIVE NEGATIVE Final   Staphylococcus aureus NEGATIVE NEGATIVE Final    Comment: (NOTE) The Xpert SA Assay (FDA approved for NASAL specimens in patients 32 years of age and older), is one component of a comprehensive surveillance program. It is not intended to diagnose infection nor to guide or monitor treatment. Performed at [facility], [place] [address], [place], [place] [address]   Cath Tip Culture     Status: None (Preliminary result)   Collection Time: 01/01/21  3:07 PM   Specimen: Catheter Tip; Other  Result Value Ref Range Status   Specimen Description CATH TIP  Final   Special Requests NONE  Final   Culture   Final    NO GROWTH < 24 HOURS Performed at [facility], [place] [address], [place], [place] [address]    Report Status PENDING  Incomplete     Medications:    allopurinol  100 mg Oral Daily   aspirin EC  81 mg Oral Daily   atorvastatin  40 mg Oral QHS   [START ON 01/04/2021] calcitRIOL  0.5 mcg Oral Q M,W,F-HD   Chlorhexidine Gluconate Cloth  6 each Topical Q0600   cholecalciferol  2,000 Units Oral Daily   [START ON 01/04/2021] darbepoetin (ARANESP) injection - DIALYSIS  60 mcg Intravenous Q Mon-HD   docusate sodium  100  mg Oral BID   feeding supplement (NEPRO CARB STEADY)  237 mL Oral BID BM   folic acid  1 mg Oral Daily   heparin  5,000 Units Subcutaneous Q8H   insulin aspart  0-6 Units Subcutaneous Q4H   insulin glargine-yfgn  20 Units Subcutaneous Daily   midodrine  5 mg Oral Once per day on Mon Wed Fri   multivitamin  1 tablet Oral QHS   sodium chloride flush  3 mL Intravenous Q12H   cyanocobalamin  1,000 mcg Oral Daily   Continuous Infusions:  sodium chloride     [START ON 01/04/2021]  ceFAZolin (ANCEF) IV     ceFEPime (MAXIPIME) IV 2 g (01/01/21 2104)      LOS: 10  days   Charlynne Cousins  [facility]  01/03/2021, 7:56 AM

## 2021-01-04 ENCOUNTER — Inpatient Hospital Stay (HOSPITAL_COMMUNITY): Payer: Medicaid Other

## 2021-01-04 ENCOUNTER — Encounter (HOSPITAL_COMMUNITY): Payer: Self-pay | Admitting: Internal Medicine

## 2021-01-04 ENCOUNTER — Encounter (HOSPITAL_COMMUNITY)
Admission: EM | Disposition: A | Discharge status: Home / Self Care | Payer: Self-pay | Source: Home / Self Care | Attending: Family Medicine

## 2021-01-04 DIAGNOSIS — T827XXA Infection and inflammatory reaction due to other cardiac and vascular devices, implants and grafts, initial encounter: Secondary | ICD-10-CM

## 2021-01-04 DIAGNOSIS — L089 Local infection of the skin and subcutaneous tissue, unspecified: Secondary | ICD-10-CM | POA: Diagnosis not present

## 2021-01-04 DIAGNOSIS — S61402A Unspecified open wound of left hand, initial encounter: Secondary | ICD-10-CM | POA: Diagnosis not present

## 2021-01-04 DIAGNOSIS — R509 Fever, unspecified: Secondary | ICD-10-CM | POA: Diagnosis not present

## 2021-01-04 DIAGNOSIS — N186 End stage renal disease: Secondary | ICD-10-CM

## 2021-01-04 HISTORY — PX: INSERTION OF DIALYSIS CATHETER: SHX1324

## 2021-01-04 LAB — BASIC METABOLIC PANEL
Anion gap: 12 (ref 5–15)
BUN: 54 mg/dL — ABNORMAL HIGH (ref 6–20)
CO2: 25 mmol/L (ref 22–32)
Calcium: 8.6 mg/dL — ABNORMAL LOW (ref 8.9–10.3)
Chloride: 96 mmol/L — ABNORMAL LOW (ref 98–111)
Creatinine, Ser: 7.64 mg/dL — ABNORMAL HIGH (ref 0.61–1.24)
GFR, Estimated: 8 mL/min — ABNORMAL LOW (ref >60)
Glucose, Bld: 147 mg/dL — ABNORMAL HIGH (ref 70–99)
Potassium: 4 mmol/L (ref 3.5–5.1)
Sodium: 133 mmol/L — ABNORMAL LOW (ref 135–145)

## 2021-01-04 LAB — CBC WITH DIFFERENTIAL/PLATELET
Abs Immature Granulocytes: 0.18 10*3/uL — ABNORMAL HIGH (ref 0.00–0.07)
Basophils Absolute: 0 10*3/uL (ref 0.0–0.1)
Basophils Relative: 0 %
Eosinophils Absolute: 0.2 10*3/uL (ref 0.0–0.5)
Eosinophils Relative: 2 %
HCT: 27.7 % — ABNORMAL LOW (ref 39.0–52.0)
Hemoglobin: 9 g/dL — ABNORMAL LOW (ref 13.0–17.0)
Immature Granulocytes: 2 %
Lymphocytes Relative: 26 %
Lymphs Abs: 1.9 10*3/uL (ref 0.7–4.0)
MCH: 29.9 pg (ref 26.0–34.0)
MCHC: 32.5 g/dL (ref 30.0–36.0)
MCV: 92 fL (ref 80.0–100.0)
Monocytes Absolute: 0.6 10*3/uL (ref 0.1–1.0)
Monocytes Relative: 8 %
Neutro Abs: 4.5 10*3/uL (ref 1.7–7.7)
Neutrophils Relative %: 62 %
Platelets: 227 10*3/uL (ref 150–400)
RBC: 3.01 MIL/uL — ABNORMAL LOW (ref 4.22–5.81)
RDW: 15.8 % — ABNORMAL HIGH (ref 11.5–15.5)
WBC: 7.4 10*3/uL (ref 4.0–10.5)
nRBC: 0 % (ref 0.0–0.2)

## 2021-01-04 LAB — MAGNESIUM: Magnesium: 2.7 mg/dL — ABNORMAL HIGH (ref 1.7–2.4)

## 2021-01-04 LAB — CATH TIP CULTURE: Culture: NO GROWTH

## 2021-01-04 LAB — GLUCOSE, CAPILLARY
Glucose-Capillary: 143 mg/dL — ABNORMAL HIGH (ref 70–99)
Glucose-Capillary: 144 mg/dL — ABNORMAL HIGH (ref 70–99)
Glucose-Capillary: 147 mg/dL — ABNORMAL HIGH (ref 70–99)
Glucose-Capillary: 161 mg/dL — ABNORMAL HIGH (ref 70–99)
Glucose-Capillary: 183 mg/dL — ABNORMAL HIGH (ref 70–99)
Glucose-Capillary: 195 mg/dL — ABNORMAL HIGH (ref 70–99)

## 2021-01-04 LAB — PHOSPHORUS: Phosphorus: 5.6 mg/dL — ABNORMAL HIGH (ref 2.5–4.6)

## 2021-01-04 SURGERY — INSERTION OF DIALYSIS CATHETER
Anesthesia: Monitor Anesthesia Care | Site: Neck | Laterality: Left

## 2021-01-04 MED ORDER — ONDANSETRON HCL 4 MG/2ML IJ SOLN
INTRAMUSCULAR | Status: AC
  Filled 2021-01-04: qty 2

## 2021-01-04 MED ORDER — HEPARIN 6000 UNIT IRRIGATION SOLUTION
Status: DC | PRN
  Administered 2021-01-04: 1

## 2021-01-04 MED ORDER — ONDANSETRON HCL 4 MG/2ML IJ SOLN: 4.0000 mg | Freq: Once | INTRAMUSCULAR | Status: DC | PRN

## 2021-01-04 MED ORDER — CEFAZOLIN SODIUM-DEXTROSE 2-4 GM/100ML-% IV SOLN
2.0000 g | Freq: Once | INTRAVENOUS | Status: AC
  Administered 2021-01-04: 2 g via INTRAVENOUS

## 2021-01-04 MED ORDER — FENTANYL CITRATE (PF) 100 MCG/2ML IJ SOLN
INTRAMUSCULAR | Status: DC | PRN
  Administered 2021-01-04: 50 ug via INTRAVENOUS

## 2021-01-04 MED ORDER — CEFAZOLIN SODIUM-DEXTROSE 2-4 GM/100ML-% IV SOLN
INTRAVENOUS | Status: AC
  Filled 2021-01-04: qty 100

## 2021-01-04 MED ORDER — FENTANYL CITRATE (PF) 250 MCG/5ML IJ SOLN
INTRAMUSCULAR | Status: AC
  Filled 2021-01-04: qty 5

## 2021-01-04 MED ORDER — FENTANYL CITRATE (PF) 100 MCG/2ML IJ SOLN: 25.0000 ug | INTRAMUSCULAR | Status: DC | PRN

## 2021-01-04 MED ORDER — HEPARIN 6000 UNIT IRRIGATION SOLUTION
Status: AC
  Filled 2021-01-04: qty 500

## 2021-01-04 MED ORDER — LIDOCAINE-EPINEPHRINE (PF) 1 %-1:200000 IJ SOLN
INTRAMUSCULAR | Status: DC | PRN
  Administered 2021-01-04: 10 mL via INTRADERMAL

## 2021-01-04 MED ORDER — LIDOCAINE-EPINEPHRINE (PF) 1 %-1:200000 IJ SOLN
INTRAMUSCULAR | Status: AC
  Filled 2021-01-04: qty 30

## 2021-01-04 MED ORDER — ORAL CARE MOUTH RINSE: 15.0000 mL | Freq: Once | OROMUCOSAL | Status: AC

## 2021-01-04 MED ORDER — LIDOCAINE 2% (20 MG/ML) 5 ML SYRINGE
INTRAMUSCULAR | Status: DC | PRN
  Administered 2021-01-04: 40 mg via INTRAVENOUS

## 2021-01-04 MED ORDER — INSULIN ASPART 100 UNIT/ML IJ SOLN
3.0000 [IU] | Freq: Three times a day (TID) | INTRAMUSCULAR | Status: DC
  Administered 2021-01-05: 3 [IU] via SUBCUTANEOUS

## 2021-01-04 MED ORDER — ONDANSETRON HCL 4 MG/2ML IJ SOLN
INTRAMUSCULAR | Status: DC | PRN
  Administered 2021-01-04: 4 mg via INTRAVENOUS

## 2021-01-04 MED ORDER — HEPARIN SODIUM (PORCINE) 1000 UNIT/ML IJ SOLN
INTRAMUSCULAR | Status: DC | PRN
  Administered 2021-01-04: 3800 [IU]

## 2021-01-04 MED ORDER — ACETAMINOPHEN 10 MG/ML IV SOLN
1000.0000 mg | Freq: Once | INTRAVENOUS | Status: DC | PRN
Start: 2021-01-04 — End: 2021-01-04

## 2021-01-04 MED ORDER — 0.9 % SODIUM CHLORIDE (POUR BTL) OPTIME
TOPICAL | Status: DC | PRN
  Administered 2021-01-04: 1000 mL

## 2021-01-04 MED ORDER — CHLORHEXIDINE GLUCONATE 0.12 % MT SOLN
15.0000 mL | Freq: Once | OROMUCOSAL | Status: AC
  Administered 2021-01-04: 15 mL via OROMUCOSAL

## 2021-01-04 MED ORDER — PROPOFOL 500 MG/50ML IV EMUL
INTRAVENOUS | Status: DC | PRN
  Administered 2021-01-04: 75 ug/kg/min via INTRAVENOUS

## 2021-01-04 MED ORDER — LIDOCAINE HCL (PF) 1 % IJ SOLN
INTRAMUSCULAR | Status: AC
  Filled 2021-01-04: qty 30

## 2021-01-04 MED ORDER — SODIUM CHLORIDE 0.9 % IV SOLN: INTRAVENOUS | Status: DC

## 2021-01-04 MED ORDER — PROPOFOL 10 MG/ML IV BOLUS
INTRAVENOUS | Status: DC | PRN
  Administered 2021-01-04: 30 mg via INTRAVENOUS

## 2021-01-04 MED ORDER — HEPARIN SODIUM (PORCINE) 1000 UNIT/ML IJ SOLN
INTRAMUSCULAR | Status: AC
  Filled 2021-01-04: qty 1

## 2021-01-04 SURGICAL SUPPLY — 39 items
BAG DECANTER FOR FLEXI CONT (MISCELLANEOUS) ×2 IMPLANT
BENZOIN TINCTURE PRP APPL 2/3 (GAUZE/BANDAGES/DRESSINGS) ×2 IMPLANT
BIOPATCH RED 1 DISK 7.0 (GAUZE/BANDAGES/DRESSINGS) ×2 IMPLANT
CATH PALINDROME-P 19CM W/VT (CATHETERS) ×2 IMPLANT
CATH PALINDROME-P 23CM W/VT (CATHETERS) ×2 IMPLANT
CATH PALINDROME-P 28CM W/VT (CATHETERS) IMPLANT
COVER PROBE W GEL 5X96 (DRAPES) ×2 IMPLANT
COVER SURGICAL LIGHT HANDLE (MISCELLANEOUS) ×2 IMPLANT
DERMABOND ADVANCED (GAUZE/BANDAGES/DRESSINGS) ×1
DERMABOND ADVANCED .7 DNX12 (GAUZE/BANDAGES/DRESSINGS) ×1 IMPLANT
DRAPE C-ARM 42X72 X-RAY (DRAPES) ×2 IMPLANT
DRAPE CHEST BREAST 15X10 FENES (DRAPES) ×2 IMPLANT
GAUZE 4X4 16PLY ~~LOC~~+RFID DBL (SPONGE) ×2 IMPLANT
GAUZE SPONGE 4X4 12PLY STRL (GAUZE/BANDAGES/DRESSINGS) ×2 IMPLANT
GLOVE SURG POLYISO LF SZ8 (GLOVE) ×2 IMPLANT
GOWN STRL REUS W/ TWL LRG LVL3 (GOWN DISPOSABLE) ×1 IMPLANT
GOWN STRL REUS W/ TWL XL LVL3 (GOWN DISPOSABLE) ×1 IMPLANT
GOWN STRL REUS W/TWL LRG LVL3 (GOWN DISPOSABLE) ×1
GOWN STRL REUS W/TWL XL LVL3 (GOWN DISPOSABLE) ×1
KIT BASIN OR (CUSTOM PROCEDURE TRAY) ×2 IMPLANT
KIT PALINDROME-P 55CM (CATHETERS) IMPLANT
KIT TURNOVER KIT B (KITS) ×2 IMPLANT
NEEDLE 18GX1X1/2 (RX/OR ONLY) (NEEDLE) ×2 IMPLANT
NEEDLE HYPO 25GX1X1/2 BEV (NEEDLE) ×2 IMPLANT
NS IRRIG 1000ML POUR BTL (IV SOLUTION) ×2 IMPLANT
PACK SURGICAL SETUP 50X90 (CUSTOM PROCEDURE TRAY) ×2 IMPLANT
PAD ARMBOARD 7.5X6 YLW CONV (MISCELLANEOUS) ×4 IMPLANT
SET MICROPUNCTURE 5F STIFF (MISCELLANEOUS) ×2 IMPLANT
SOAP 2 % CHG 4 OZ (WOUND CARE) ×2 IMPLANT
STRIP CLOSURE SKIN 1/2X4 (GAUZE/BANDAGES/DRESSINGS) ×2 IMPLANT
SUT ETHILON 3 0 PS 1 (SUTURE) ×2 IMPLANT
SUT MNCRL AB 4-0 PS2 18 (SUTURE) ×2 IMPLANT
SYR 10ML LL (SYRINGE) ×2 IMPLANT
SYR 20ML LL LF (SYRINGE) ×4 IMPLANT
SYR 5ML LL (SYRINGE) ×2 IMPLANT
SYR CONTROL 10ML LL (SYRINGE) ×2 IMPLANT
TOWEL GREEN STERILE (TOWEL DISPOSABLE) ×2 IMPLANT
TOWEL GREEN STERILE FF (TOWEL DISPOSABLE) ×4 IMPLANT
WATER STERILE IRR 1000ML POUR (IV SOLUTION) ×2 IMPLANT

## 2021-01-04 NOTE — Transfer of Care (Signed)
Immediate Anesthesia Transfer of Care Note  Patient: Kevin Meyer  Procedure(s) Performed: INSERTION OF LEFT INTERNAL JUGULAR TUNNELED DIALYSIS CATHETER (Left: Neck)  Patient Location: PACU  Anesthesia Type:MAC  Level of Consciousness: awake, alert  and oriented  Airway & Oxygen Therapy: Patient Spontanous Breathing  Post-op Assessment: Report given to RN and Post -op Vital signs reviewed and stable  Post vital signs: Reviewed and stable  Last Vitals:  Vitals Value Taken Time  BP    Temp    Pulse 89 01/04/21 1122  Resp    SpO2 93 % 01/04/21 1122  Vitals shown include unvalidated device data.  Last Pain:  Vitals:   01/04/21 0927  TempSrc: Oral  PainSc: 0-No pain      Patients Stated Pain Goal: 2 (41/42/39 5320)  Complications: No notable events documented.

## 2021-01-04 NOTE — Op Note (Signed)
DATE OF SERVICE: 01/04/2021  PATIENT:  Kevin Meyer  50 y.o. male  PRE-OPERATIVE DIAGNOSIS:  ESRD  POST-OPERATIVE DIAGNOSIS:  Same  PROCEDURE:   Left internal jugular tunneled dialysis catheter (23cm)  SURGEON:  Surgeon(s) and Role:    * Cherre Robins, MD - Primary  ASSISTANT: none  ANESTHESIA:   local and MAC  EBL: min  BLOOD ADMINISTERED:none  DRAINS: none   LOCAL MEDICATIONS USED:  Lidocaine  SPECIMEN:  none  COUNTS: confirmed correct .  TOURNIQUET:  none  PATIENT DISPOSITION:  PACU - hemodynamically stable.   Delay start of Pharmacological VTE agent (>24hrs) due to surgical blood loss or risk of bleeding: no  INDICATION FOR PROCEDURE: [name] is a 50 y.o. male with stage renal disease admitted to the hospital with an infectious picture of unclear etiology.  Incidentally he had significant access related hand ischemia of the left upper extremity for which I performed a distal revascularization with interval ligation last week.  The existing right internal jugular tunneled dialysis catheter was thought to be the culprit for his infectious picture and was removed at bedside.  He was given a line holiday over the weekend.  I offered him a tunneled dialysis catheter in the operating room today.  Patient understood risk, benefits, and alternatives.  He had no questions.  OPERATIVE FINDINGS: Unremarkable left internal jugular tunneled dialysis catheter placement.  DESCRIPTION OF PROCEDURE: After identification of the patient in the pre-operative holding area, the patient was transferred to the operating room. The patient was positioned supine on the operating room table. Anesthesia was induced. The neck and chest was prepped and draped in standard fashion. A surgical pause was performed confirming correct patient, procedure, and operative location.  Using ultrasound guidance the left internal jugular vein was accessed with micropuncture technique.  Through the  micropuncture sheath a floppy J-wire was advanced into the superior vena cava.  A small incision was made around the skin access point.  The access point was serially dilated under direct fluoroscopic guidance.  A peel-away sheath was introduced into the superior vena cava under fluoroscopic guidance.  A counterincision was made in the chest under the clavicle.  A 23 cm tunnel dialysis catheter was then tunneled under the skin, over the clavicle into the incision in the neck.  The tunneling device was removed and the catheter fed through the peel-away sheath into the superior vena cava.  The peel-away sheath was removed and the catheter gently pulled back.  Adequate position was confirmed with x-ray.  The catheter was tested and found to flush and draw back well.  Catheter was heparin locked.  Caps were applied.  Catheter was sutured to the skin.  The neck incision was closed with 4-0 Monocryl.  Upon completion of the case instrument and sharps counts were confirmed correct. The patient was transferred to the PACU in good condition. I was present for all portions of the procedure.  Yevonne Aline. Stanford Breed, MD [facility] Phone Number: [phone] 01/04/2021 11:25 AM

## 2021-01-04 NOTE — Progress Notes (Addendum)
[facility] Progress Note   Subjective:    Patient seen and examined at bedside. S/P L tunneled HD catheter by Dr. Stanford Breed (VVS). Denies SOB, CP, N/V, and fevers. Tolerated HD on 9/9. Plan for HD this afternoon.   Objective Vitals:   01/04/21 1124 01/04/21 1138 01/04/21 1153 01/04/21 1215  BP: (!) 117/44 136/77 134/82 140/78  Pulse: 92 89 89 92  Resp: '17 19 16 17  '$ Temp: 98.6 F (37 C)   98.2 F (36.8 C)  TempSrc:    Oral  SpO2: 92% 93% 94% 93%  Weight:      Height:       Physical Exam General: Well-appearing male; NAD Heart: S1 and S2; No murmurs Lungs: CTA; No wheezing, rales, or rhinchi Abdomen: Soft and non-tender Extremities: Trace edema BLLE Dialysis Access: L AVF (+) Bruit/Thrill (steri-strips present); Edema of L hand improving-warmth coming back. Ischemic ulcers L 2nd/5th digits appear to be improving.   Filed Weights   01/01/21 1300 01/03/21 0420 01/04/21 0406  Weight: 101.7 kg 105.1 kg 104.2 kg    Intake/Output Summary (Last 24 hours) at 01/04/2021 1409 Last data filed at 01/04/2021 1124 Gross per 24 hour  Intake 282.33 ml  Output 305 ml  Net -22.67 ml    Additional Objective Labs: Basic Metabolic Panel: Recent Labs  Lab 12/31/20 0122 01/01/21 0146 01/02/21 0055 01/03/21 0043 01/04/21 0155  NA 132*  --  134*  --  133*  K 4.0  --  3.8  --  4.0  CL 92*  --  98  --  96*  CO2 29  --  29  --  25  GLUCOSE 145*  --  164*  --  147*  BUN 24*  --  25*  --  54*  CREATININE 3.89*  --  4.26*  --  7.64*  CALCIUM 7.8*  --  8.6*  --  8.6*  PHOS 2.2*   < > 3.2 4.4 5.6*   < > = values in this interval not displayed.   Liver Function Tests: Recent Labs  Lab 12/29/20 0248 12/30/20 1636 12/31/20 0122  AST 111*  --  74*  ALT 120*  --  70*  ALKPHOS 72  --  57  BILITOT 0.6  --  0.3  PROT 6.5  --  6.3*  ALBUMIN 3.0* 3.0* 2.9*   No results for input(s): LIPASE, AMYLASE in the last 168 hours. CBC: Recent Labs  Lab 12/31/20 0122  01/01/21 0146 01/02/21 0055 01/03/21 0043 01/04/21 0155  WBC 11.2* 7.8 9.2 7.2 7.4  NEUTROABS 9.4* 5.1 6.4 4.5 4.5  HGB 10.4* 8.5* 9.1* 9.1* 9.0*  HCT 31.0* 27.2* 28.4* 27.8* 27.7*  MCV 90.1 93.5 93.7 92.1 92.0  PLT 207 179 189 193 227   Blood Culture    Component Value Date/Time   SDES CATH TIP 01/01/2021 1507   SPECREQUEST NONE 01/01/2021 1507   CULT  01/01/2021 1507    NO GROWTH 3 DAYS Performed at [facility] [address], [place], [place] [address]    REPTSTATUS 01/04/2021 FINAL 01/01/2021 1507    Cardiac Enzymes: No results for input(s): CKTOTAL, CKMB, CKMBINDEX, TROPONINI in the last 168 hours. CBG: Recent Labs  Lab 01/03/21 2002 01/04/21 0012 01/04/21 0409 01/04/21 0926 01/04/21 1122  GLUCAP 210* 161* 195* 143* 144*   Iron Studies: No results for input(s): IRON, TIBC, TRANSFERRIN, FERRITIN in the last 72 hours. Lab Results  Component Value Date   INR 1.0 12/24/2020  INR 1.3 (H) 06/23/2020   Studies/Results: DG Chest Port 1 View  Result Date: 01/04/2021 CLINICAL DATA:  Status post dialysis catheter insertion EXAM: PORTABLE CHEST 1 VIEW COMPARISON:  12/24/2020 FINDINGS: Lungs are clear. No pneumothorax or pleural effusion. Cardiac size is mildly enlarged, unchanged. Right internal jugular hemodialysis catheter has been removed. Left internal jugular hemodialysis catheter tip noted within the superior vena cava. The pulmonary vascularity is normal. IMPRESSION: Left internal jugular hemodialysis catheter tip within the superior vena cava. No pneumothorax. Stable cardiomegaly Electronically Signed   By: Fidela Salisbury M.D.   On: 01/04/2021 14:05   HYBRID OR IMAGING ([facility] ONLY)  Result Date: 01/04/2021 There is no interpretation for this exam.  This order is for images obtained during a surgical procedure.  Please See "Surgeries" Tab for more information regarding the procedure.    Medications:  ceFEPime (MAXIPIME) IV 2 g (01/01/21 2104)    allopurinol   100 mg Oral Daily   aspirin EC  81 mg Oral Daily   atorvastatin  40 mg Oral QHS   calcitRIOL  0.5 mcg Oral Q M,W,F-HD   Chlorhexidine Gluconate Cloth  6 each Topical Q0600   Chlorhexidine Gluconate Cloth  6 each Topical Q0600   cholecalciferol  2,000 Units Oral Daily   darbepoetin (ARANESP) injection - DIALYSIS  60 mcg Intravenous Q Mon-HD   docusate sodium  100 mg Oral BID   feeding supplement (NEPRO CARB STEADY)  237 mL Oral BID BM   folic acid  1 mg Oral Daily   heparin  5,000 Units Subcutaneous Q8H   insulin aspart  0-5 Units Subcutaneous QHS   insulin aspart  0-6 Units Subcutaneous TID WC   insulin aspart  3 Units Subcutaneous TID WC   insulin glargine-yfgn  20 Units Subcutaneous Daily   midodrine  5 mg Oral Once per day on Mon Wed Fri   multivitamin  1 tablet Oral QHS   cyanocobalamin  1,000 mcg Oral Daily    Dialysis Orders: MWF at [facility] 4hr, 400/600, EDW 99.5kg, 2K/2.5Ca, AVF + TDC, heparin 5000 - Calcitriol 0.72mg PO q HD - No ESA   Assessment/Plan: 1. Chills/rigors/vomiting: blood cultures obtained from patient's outpatient HD center (12/23/20)) +enterobacter cloacae complex. Discussed findings with ID/Primary. Getting IV cefipime per ID , 2 gm after HD MWF X 2 doses-1st dose given 9/9-next due today.  [facility] appreciate their assistance.  Currently with line holiday. S/P L tunneled HD catheter today by Dr. HStanford Breed(VVS)-plan for HD this afternoon. I spoke to Dr. CLinus Salmons(ID)-no need to repeat blood cultures at this time.  2. 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  Left hand improving. 3. ESRD: usual MWF schedule - Next HD this afternoon following TDC placement.  4. HTN/volume: BP in goal.  Not on antihypertensives.  On midodrine pre HD.  Does not appear volume overloaded.  UF as tolerated.  5. Anemia: Last Hgb 9.1. Start aranesp 652m qwk with HD Monday.  6. Secondary  hyperparathyroidism: Corrected Ca ok.  Phos in goal without binders.  Continue VDRA.  7. Nutrition: Alb low, continue protein supplements. 8. T2DM  CoTobie PoetNP [facility] 01/04/2021,2:09 PM  LOS: 11 days    Seen and examined independently.  Agree with note and exam as documented above by physician extender and as noted here.  General adult male in bed in no acute distress HEENT normocephalic  atraumatic extraocular movements intact sclera anicteric Neck supple trachea midline Lungs clear to auscultation bilaterally normal work of breathing at rest on RA Heart S1S2 no rub Abdomen soft nontender nondistended Extremities trace to 1+ edema  Psych normal mood and affect Neuro - alert and oriented x 3 provides hx and follows commands Access no tunn catheter at time of my exam this am  Enterobacter bacteremia - abx per primary; s/p line holiday - would obtain TTE (ordered)  ESRD   - tunn catheter today and for HD today   LUE AVF with steal s/p DRIL 9/7  hypotension - on midodrine here - not listed as home med but list may be inaccurate  Claudia Desanctis, MD 01/04/2021  2:41 PM

## 2021-01-04 NOTE — Anesthesia Postprocedure Evaluation (Signed)
Anesthesia Post Note  Patient: Kevin Meyer  Procedure(s) Performed: INSERTION OF LEFT INTERNAL JUGULAR TUNNELED DIALYSIS CATHETER (Left: Neck)     Patient location during evaluation: PACU Anesthesia Type: MAC Level of consciousness: awake Pain management: pain level controlled Vital Signs Assessment: post-procedure vital signs reviewed and stable Respiratory status: spontaneous breathing, nonlabored ventilation, respiratory function stable and patient connected to nasal cannula oxygen Cardiovascular status: stable and blood pressure returned to baseline Postop Assessment: no apparent nausea or vomiting Anesthetic complications: no   No notable events documented.  Last Vitals:  Vitals:   01/04/21 1702 01/04/21 1805  BP: (!) 156/77 (!) 156/80  Pulse: 81 100  Resp: 15 18  Temp:  37.1 C  SpO2: 96% 99%    Last Pain:  Vitals:   01/04/21 1805  TempSrc: Oral  PainSc:                  Hasten Sweitzer P Jamarien Rodkey

## 2021-01-04 NOTE — Progress Notes (Signed)
[facility] [place] PROGRESS NOTE  ASSESSMENT / PLAN: Kevin Meyer is a 50 y.o. male s/p LUE DRIL. Line holiday over the weekend for ? Infected TDC. Plan new TDC in OR today. OK for discharge after HD today.   SUBJECTIVE: No complaints. Reviewed operative plan.  OBJECTIVE: BP (!) 157/72   Pulse 90   Temp (!) 97.5 F (36.4 C) (Oral)   Resp 18   Ht '5\' 6"'$  (1.676 m)   Wt 104.2 kg   SpO2 96%   BMI 37.08 kg/m   Intake/Output Summary (Last 24 hours) at 01/04/2021 1010 Last data filed at 01/03/2021 2000 Gross per 24 hour  Intake 240 ml  Output 300 ml  Net -60 ml    Constitutional: well appearing. no acute distress. Cardiac: RRR. Pulmonary: unlabored Abdomen: soft Vascular: LUE hand warm. Incisions clean and dry. Good thrill.   CBC Latest Ref Rng & Units 01/04/2021 01/03/2021 01/02/2021  WBC 4.0 - 10.5 K/uL 7.4 7.2 9.2  Hemoglobin 13.0 - 17.0 g/dL 9.0(L) 9.1(L) 9.1(L)  Hematocrit 39.0 - 52.0 % 27.7(L) 27.8(L) 28.4(L)  Platelets 150 - 400 K/uL 227 193 189     CMP Latest Ref Rng & Units 01/04/2021 01/02/2021 12/31/2020  Glucose 70 - 99 mg/dL 147(H) 164(H) 145(H)  BUN 6 - 20 mg/dL 54(H) 25(H) 24(H)  Creatinine 0.61 - 1.24 mg/dL 7.64(H) 4.26(H) 3.89(H)  Sodium 135 - 145 mmol/L 133(L) 134(L) 132(L)  Potassium 3.5 - 5.1 mmol/L 4.0 3.8 4.0  Chloride 98 - 111 mmol/L 96(L) 98 92(L)  CO2 22 - 32 mmol/L '25 29 29  '$ Calcium 8.9 - 10.3 mg/dL 8.6(L) 8.6(L) 7.8(L)  Total Protein 6.5 - 8.1 g/dL - - 6.3(L)  Total Bilirubin 0.3 - 1.2 mg/dL - - 0.3  Alkaline Phos 38 - 126 U/L - - 57  AST 15 - 41 U/L - - 74(H)  ALT 0 - 44 U/L - - 70(H)    Estimated Creatinine Clearance: 13.1 mL/min (A) (by C-G formula based on SCr of 7.64 mg/dL (H)).  Yevonne Aline. Stanford Breed, MD [facility] Phone Number: [phone] 01/04/2021 10:10 AM

## 2021-01-04 NOTE — Progress Notes (Signed)
[facility] PROGRESS NOTE    Progress Note  Kevin Meyer  [id number] DOB: July 17, 1970 DOA: 12/24/2020 PCP: Royce Macadamia D., PA-C     Brief Narrative:   Kevin Meyer is an 50 y.o. male past medical history of essential hypertension, hyperlipidemia gout diabetes mellitus type 2 diabetic nephropathy end-stage renal disease who dialyzes Monday Wednesday and Friday, status post a right-sided tunneled catheter with a life AV fistula, chronic systolic heart failure comes into the ED at [facility] for fevers and left hand pain and 2 ulcers of the left hand that have developed over the last 3 weeks transferred to call for Vascular surgery was consulted angiogram performed the patient underwent DRIL procedure. He developed sepsis started on IV Vanco and Keflex. He continues to have intermittent fevers but no obvious source was identified.  Infectious disease was consulted, he subsequently started developing rigors blood cultures were ordered and grew enterococci on 12/23/2020. HD catheter was removed and he is now on IV cefepime and has remained afebrile since then.  Antibiotics: IV cefepime  Microbiology data: 12/23/2020 blood culture: Enterococcus 12/26/2020 blood cultures negative till date.  Procedures: 12/25/2020 arteriogram with selective catheterization of the left brachial artery that showed minimal narrowing at the proximal end of the fistula and moderate diffuse radial disease. 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 Patient is currently on line holiday. Next HD on 01/04/2021 after [facility] placement Continue midodrine.  Elevated LFTs: There was concern of  exposure to hepatitis B hepatitis panel has been negative. And seizures are trending down elevation of LFTs was likely due to sepsis.  End-stage renal disease on hemodialysis: Further management per renal.  Hyperkalemia: Noted.  Hypotension, chronic: He is on midodrine.  Diabetes mellitus type 2 with diabetic nephropathy: With an A1c 7.2, continue sliding scale insulin.  Hyperlipidemia: Continue statins.   DVT prophylaxis: lovenox Family Communication:none Status is: Inpatient  Remains inpatient appropriate because:Hemodynamically unstable  Dispo: The patient is from: Home              Anticipated d/c is to: Home              Patient currently is not medically stable to d/c.   Difficult to place patient No    Code Status:     Code Status Orders  (From admission, onward)           Start     Ordered   12/24/20 1428  Full code  Continuous        12/24/20 1432           Code Status History     Date Active Date Inactive Code Status Order ID Comments User Context   06/22/2020 2339 06/29/2020 2108 Full Code [id number]  Bernadette Hoit, DO Inpatient   09/08/2017 1736 09/10/2017 1414 Full Code [id number]  Barton Dubois, MD Inpatient   08/25/2017 1945 08/26/2017 1738 Full Code [id number]  Phillips Grout, MD ED         IV Access:   Peripheral IV   Procedures and diagnostic studies:   No results found.   Medical Consultants:   None.   Subjective:    Kevin Meyer has no complains, he relates he is ready to do this  Objective:    Vitals:   01/03/21 1544 01/03/21 2001 01/04/21 0009 01/04/21 0406  BP: 123/74 139/85 122/72 128/69  Pulse: 85 89 90 92  Resp: '18 14 16 18  '$ Temp: 98.2 F (36.8 C) 98.5 F (36.9 C) 98.6 F (37 C) 98.5 F (36.9 C)  TempSrc: Oral Oral Oral Oral  SpO2: 97% 94% 98% 93%  Weight:    104.2 kg  Height:       SpO2: 93 % O2 Flow Rate (L/min): 1 L/min FiO2 (%): 21 %   Intake/Output Summary (Last 24 hours) at 01/04/2021  [id number] Last data filed at 01/03/2021 2000 Gross per 24 hour  Intake 480 ml  Output 300 ml  Net 180 ml    Filed Weights   01/01/21 1300 01/03/21 0420 01/04/21 0406  Weight: 101.7 kg 105.1 kg 104.2 kg    Exam: General exam: In no acute distress. Respiratory system: Good air movement and clear to auscultation. Cardiovascular system: S1 & S2 heard, RRR. No JVD. Gastrointestinal system: Abdomen is nondistended, soft and nontender.  Extremities: No pedal edema. Skin: No rashes, lesions or ulcers Psychiatry: Judgement and insight appear normal. Mood & affect appropriate. Data Reviewed:    Labs: Basic Metabolic Panel: Recent Labs  Lab 12/29/20 0248 12/30/20 0218 12/30/20 1636 12/31/20 0122 01/01/21 0146 01/02/21 0055 01/03/21 0043 01/04/21 0155  NA 133*  --  130* 132*  --  134*  --  133*  K 4.7  --  4.6 4.0  --  3.8  --  4.0  CL 96*  --  95* 92*  --  98  --  96*  CO2 29  --  24 29  --  29  --  25  GLUCOSE 241*  --  229* 145*  --  164*  --  147*  BUN 38*  --  65* 24*  --  25*  --  54*  CREATININE 5.56*  --  7.26* 3.89*  --  4.26*  --  7.64*  CALCIUM 8.8*  --  8.8* 7.8*  --  8.6*  --  8.6*  MG 2.1   < >  --  2.2 2.4 2.4 2.5* 2.7*  PHOS 2.2*   < > 3.2 2.2* 4.1 3.2 4.4 5.6*   < > = values in this interval not displayed.    GFR Estimated Creatinine Clearance: 13.1 mL/min (A) (by C-G formula based on SCr of 7.64 mg/dL (H)). Liver Function Tests: Recent Labs  Lab 12/29/20 0248 12/30/20 1636 12/31/20 0122  AST 111*  --  74*  ALT 120*  --  70*  ALKPHOS 72  --  57  BILITOT 0.6  --  0.3  PROT 6.5  --  6.3*  ALBUMIN 3.0* 3.0* 2.9*    No results for input(s): LIPASE, AMYLASE in the last 168 hours. No results for input(s): AMMONIA in the last 168 hours. Coagulation profile No results for input(s): INR, PROTIME in the last 168 hours. COVID-19 Labs  No results for input(s): DDIMER, FERRITIN, LDH, CRP in the last 72 hours.  Lab Results  Component Value Date    SARSCOV2NAA NEGATIVE 12/24/2020   [place] NEGATIVE 09/01/2020   [facility] NEGATIVE 08/18/2020   [place] NEGATIVE 06/22/2020    CBC: Recent Labs  Lab 12/31/20 0122 01/01/21 0146 01/02/21 0055 01/03/21 0043 01/04/21 0155  WBC 11.2* 7.8 9.2 7.2 7.4  NEUTROABS 9.4* 5.1 6.4 4.5 4.5  HGB 10.4* 8.5* 9.1* 9.1* 9.0*  HCT 31.0* 27.2* 28.4* 27.8* 27.7*  MCV  90.1 93.5 93.7 92.1 92.0  PLT 207 179 189 193 227    Cardiac Enzymes: No results for input(s): CKTOTAL, CKMB, CKMBINDEX, TROPONINI in the last 168 hours. BNP (last 3 results) No results for input(s): PROBNP in the last 8760 hours. CBG: Recent Labs  Lab 01/03/21 1541 01/03/21 1821 01/03/21 2002 01/04/21 0012 01/04/21 0409  GLUCAP 148* 204* 210* 161* 195*    D-Dimer: No results for input(s): DDIMER in the last 72 hours. Hgb A1c: No results for input(s): HGBA1C in the last 72 hours. Lipid Profile: No results for input(s): CHOL, HDL, LDLCALC, TRIG, CHOLHDL, LDLDIRECT in the last 72 hours. Thyroid function studies: No results for input(s): TSH, T4TOTAL, T3FREE, THYROIDAB in the last 72 hours.  Invalid input(s): FREET3 Anemia work up: No results for input(s): VITAMINB12, FOLATE, FERRITIN, TIBC, IRON, RETICCTPCT in the last 72 hours. Sepsis Labs: Recent Labs  Lab 01/01/21 0146 01/02/21 0055 01/03/21 0043 01/04/21 0155  WBC 7.8 9.2 7.2 7.4    Microbiology Recent Results (from the past 240 hour(s))  Culture, blood (routine x 2)     Status: None   Collection Time: 12/26/20  1:52 PM   Specimen: BLOOD  Result Value Ref Range Status   Specimen Description BLOOD BLOOD LEFT FOREARM  Final   Special Requests   Final    BOTTLES DRAWN AEROBIC AND ANAEROBIC Blood Culture adequate volume   Culture   Final    NO GROWTH 5 DAYS Performed at [facility], [address] [place], [place] [address]    Report Status 12/31/2020 FINAL  Final  Culture, blood (routine x 2)     Status: None   Collection Time:  12/26/20  3:26 PM   Specimen: BLOOD  Result Value Ref Range Status   Specimen Description BLOOD RIGHT ANTECUBITAL  Final   Special Requests   Final    BOTTLES DRAWN AEROBIC ONLY Blood Culture adequate volume   Culture   Final    NO GROWTH 5 DAYS Performed at [facility], [place] [address], [place], [place] [address]    Report Status 12/31/2020 FINAL  Final  Surgical pcr screen     Status: None   Collection Time: 12/29/20 12:35 PM   Specimen: Nasal Mucosa; Nasal Swab  Result Value Ref Range Status   MRSA, PCR NEGATIVE NEGATIVE Final   Staphylococcus aureus NEGATIVE NEGATIVE Final    Comment: (NOTE) The Xpert SA Assay (FDA approved for NASAL specimens in patients 57 years of age and older), is one component of a comprehensive surveillance program. It is not intended to diagnose infection nor to guide or monitor treatment. Performed at [facility], [place] [address] [place], [place] [address]   Cath Tip Culture     Status: None (Preliminary result)   Collection Time: 01/01/21  3:07 PM   Specimen: Catheter Tip; Other  Result Value Ref Range Status   Specimen Description CATH TIP  Final   Special Requests NONE  Final   Culture   Final    NO GROWTH 2 DAYS Performed at [facility], [address] [place], [place] [address]    Report Status PENDING  Incomplete     Medications:    allopurinol  100 mg Oral Daily   aspirin EC  81 mg Oral Daily   atorvastatin  40 mg Oral QHS   calcitRIOL  0.5 mcg Oral Q M,W,F-HD   Chlorhexidine Gluconate Cloth  6 each Topical Q0600   Chlorhexidine  Gluconate Cloth  6 each Topical Q0600   cholecalciferol  2,000 Units Oral Daily   darbepoetin (ARANESP) injection - DIALYSIS  60 mcg Intravenous Q Mon-HD   docusate sodium  100 mg Oral BID   feeding supplement (NEPRO CARB STEADY)  237 mL Oral BID BM   folic acid  1 mg Oral Daily   heparin  5,000 Units Subcutaneous Q8H   insulin aspart  0-5 Units Subcutaneous QHS   insulin aspart   0-6 Units Subcutaneous TID WC   insulin aspart  2 Units Subcutaneous TID WC   insulin glargine-yfgn  20 Units Subcutaneous Daily   midodrine  5 mg Oral Once per day on Mon Wed Fri   multivitamin  1 tablet Oral QHS   sodium chloride flush  3 mL Intravenous Q12H   cyanocobalamin  1,000 mcg Oral Daily   Continuous Infusions:  sodium chloride      ceFAZolin (ANCEF) IV     ceFEPime (MAXIPIME) IV 2 g (01/01/21 2104)      LOS: 11 days   Charlynne Cousins  [facility]  01/04/2021, 8:22 AM

## 2021-01-04 NOTE — Anesthesia Preprocedure Evaluation (Signed)
Anesthesia Evaluation  Patient identified by MRN, date of birth, ID band Patient awake    Reviewed: Allergy & Precautions, NPO status , Patient's Chart, lab work & pertinent test results  Airway Mallampati: III  TM Distance: >3 FB Neck ROM: Full    Dental  (+) Missing   Pulmonary neg pulmonary ROS,    Pulmonary exam normal breath sounds clear to auscultation       Cardiovascular Normal cardiovascular exam Rhythm:Regular Rate:Normal  ECG: SR, rate 88   Neuro/Psych negative neurological ROS  negative psych ROS   GI/Hepatic negative GI ROS, Neg liver ROS,   Endo/Other  diabetes, Oral Hypoglycemic Agents  Renal/GU ESRF and DialysisRenal disease     Musculoskeletal  (+) Arthritis , Gout   Abdominal (+) + obese,   Peds  Hematology  (+) anemia , HLD   Anesthesia Other Findings   Reproductive/Obstetrics                             Anesthesia Physical Anesthesia Plan  ASA: 3  Anesthesia Plan: MAC   Post-op Pain Management:    Induction: Intravenous  PONV Risk Score and Plan: 1 and Ondansetron, Dexamethasone, Propofol infusion and Treatment may vary due to age or medical condition  Airway Management Planned: Simple Face Mask  Additional Equipment:   Intra-op Plan:   Post-operative Plan:   Informed Consent: I have reviewed the patients History and Physical, chart, labs and discussed the procedure including the risks, benefits and alternatives for the proposed anesthesia with the patient or authorized representative who has indicated his/her understanding and acceptance.     Dental advisory given  Plan Discussed with: CRNA  Anesthesia Plan Comments:         Anesthesia Quick Evaluation

## 2021-01-05 ENCOUNTER — Other Ambulatory Visit (HOSPITAL_COMMUNITY): Payer: Self-pay

## 2021-01-05 ENCOUNTER — Other Ambulatory Visit (HOSPITAL_COMMUNITY): Payer: Medicaid Other

## 2021-01-05 ENCOUNTER — Encounter (HOSPITAL_COMMUNITY): Payer: Self-pay | Admitting: Vascular Surgery

## 2021-01-05 DIAGNOSIS — Z992 Dependence on renal dialysis: Secondary | ICD-10-CM | POA: Diagnosis not present

## 2021-01-05 DIAGNOSIS — I10 Essential (primary) hypertension: Secondary | ICD-10-CM | POA: Diagnosis not present

## 2021-01-05 DIAGNOSIS — A498 Other bacterial infections of unspecified site: Secondary | ICD-10-CM

## 2021-01-05 DIAGNOSIS — E118 Type 2 diabetes mellitus with unspecified complications: Secondary | ICD-10-CM | POA: Diagnosis not present

## 2021-01-05 DIAGNOSIS — Z95828 Presence of other vascular implants and grafts: Secondary | ICD-10-CM

## 2021-01-05 DIAGNOSIS — Z9889 Other specified postprocedural states: Secondary | ICD-10-CM

## 2021-01-05 DIAGNOSIS — L089 Local infection of the skin and subcutaneous tissue, unspecified: Secondary | ICD-10-CM | POA: Diagnosis not present

## 2021-01-05 LAB — CBC WITH DIFFERENTIAL/PLATELET
Abs Immature Granulocytes: 0.13 10*3/uL — ABNORMAL HIGH (ref 0.00–0.07)
Basophils Absolute: 0 10*3/uL (ref 0.0–0.1)
Basophils Relative: 0 %
Eosinophils Absolute: 0.2 10*3/uL (ref 0.0–0.5)
Eosinophils Relative: 2 %
HCT: 27.3 % — ABNORMAL LOW (ref 39.0–52.0)
Hemoglobin: 8.7 g/dL — ABNORMAL LOW (ref 13.0–17.0)
Immature Granulocytes: 2 %
Lymphocytes Relative: 20 %
Lymphs Abs: 1.4 10*3/uL (ref 0.7–4.0)
MCH: 29.3 pg (ref 26.0–34.0)
MCHC: 31.9 g/dL (ref 30.0–36.0)
MCV: 91.9 fL (ref 80.0–100.0)
Monocytes Absolute: 0.5 10*3/uL (ref 0.1–1.0)
Monocytes Relative: 8 %
Neutro Abs: 4.6 10*3/uL (ref 1.7–7.7)
Neutrophils Relative %: 68 %
Platelets: 196 10*3/uL (ref 150–400)
RBC: 2.97 MIL/uL — ABNORMAL LOW (ref 4.22–5.81)
RDW: 15.6 % — ABNORMAL HIGH (ref 11.5–15.5)
WBC: 6.8 10*3/uL (ref 4.0–10.5)
nRBC: 0 % (ref 0.0–0.2)

## 2021-01-05 LAB — BASIC METABOLIC PANEL
Anion gap: 11 (ref 5–15)
BUN: 33 mg/dL — ABNORMAL HIGH (ref 6–20)
CO2: 28 mmol/L (ref 22–32)
Calcium: 8.5 mg/dL — ABNORMAL LOW (ref 8.9–10.3)
Chloride: 95 mmol/L — ABNORMAL LOW (ref 98–111)
Creatinine, Ser: 5.42 mg/dL — ABNORMAL HIGH (ref 0.61–1.24)
GFR, Estimated: 12 mL/min — ABNORMAL LOW (ref >60)
Glucose, Bld: 141 mg/dL — ABNORMAL HIGH (ref 70–99)
Potassium: 3.6 mmol/L (ref 3.5–5.1)
Sodium: 134 mmol/L — ABNORMAL LOW (ref 135–145)

## 2021-01-05 LAB — PHOSPHORUS: Phosphorus: 4 mg/dL (ref 2.5–4.6)

## 2021-01-05 LAB — MAGNESIUM: Magnesium: 2.4 mg/dL (ref 1.7–2.4)

## 2021-01-05 LAB — GLUCOSE, CAPILLARY: Glucose-Capillary: 120 mg/dL — ABNORMAL HIGH (ref 70–99)

## 2021-01-05 MED ORDER — MIDODRINE HCL 5 MG PO TABS
5.0000 mg | ORAL_TABLET | ORAL | 3 refills | Status: DC
  Filled 2021-01-05: qty 12, 28d supply, fill #0

## 2021-01-05 MED FILL — Hepatitis B Vaccine Recomb Adjuvanted Pref Syr 20 MCG/0.5ML: INTRAMUSCULAR | Qty: 0.5 | Status: AC

## 2021-01-05 NOTE — Progress Notes (Signed)
[facility] PROGRESS NOTE    Progress Note  Kevin Meyer  [id number] DOB: Feb 24, 1971 DOA: 12/24/2020 PCP: Kevin Macadamia D., PA-C     Brief Narrative:   Kevin Meyer is an 50 y.o. male past medical history of essential hypertension, hyperlipidemia gout diabetes mellitus type 2 diabetic nephropathy end-stage renal disease who dialyzes Monday Wednesday and Friday, status post a right-sided tunneled catheter with a life AV fistula, chronic systolic heart failure comes into the ED at [facility] for fevers and left hand pain and 2 ulcers of the left hand that have developed over the last 3 weeks transferred to call for Vascular surgery was consulted angiogram performed the patient underwent DRIL procedure. He developed sepsis started on IV Vanco and Keflex. He continues to have intermittent fevers but no obvious source was identified.  Infectious disease was consulted, he subsequently started developing rigors blood cultures were ordered and grew enterococci on 12/23/2020. HD catheter was removed and he is now on IV cefepime and has remained afebrile since then.  Antibiotics: IV cefepime  Microbiology data: 12/23/2020 blood culture: Enterobacter 12/26/2020 blood cultures negative till date.  Procedures: 12/25/2020 arteriogram with selective catheterization of the left brachial artery that showed minimal narrowing at the proximal end of the fistula and moderate diffuse radial disease. 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 Next HD on 01/04/2021 after [facility] placement. Continue midodrine.  Elevated LFT's: There was concern  of exposure to hepatitis B hepatitis panel has been negative. And seizures are trending down elevation of LFTs was likely due to sepsis.  End-stage renal disease on hemodialysis: Further management per renal.  Hyperkalemia: Noted.  Hypotension, chronic: He is on midodrine.  Diabetes mellitus type 2 with diabetic nephropathy: With an A1c 7.2, continue sliding scale insulin.  Hyperlipidemia: Continue statins.   DVT prophylaxis: lovenox Family Communication:none Status is: Inpatient  Remains inpatient appropriate because:Hemodynamically unstable  Dispo: The patient is from: Home              Anticipated d/c is to: Home              Patient currently is not medically stable to d/c.   Difficult to place patient No    Code Status:     Code Status Orders  (From admission, onward)           Start     Ordered   12/24/20 1428  Full code  Continuous        12/24/20 1432           Code Status History     Date Active Date Inactive Code Status Order ID Comments User Context   06/22/2020 2339 06/29/2020 2108 Full Code [id number]  Bernadette Hoit, DO Inpatient   09/08/2017 1736 09/10/2017 1414 Full Code [id number]  Barton Dubois, MD Inpatient   08/25/2017 1945 08/26/2017 1738 Full Code [id number]  Phillips Grout, MD ED         IV Access:   Peripheral IV   Procedures and diagnostic studies:   DG Chest Port 1 View  Result Date: 01/04/2021 CLINICAL DATA:  Status post dialysis catheter insertion EXAM: PORTABLE CHEST 1 VIEW COMPARISON:  12/24/2020 FINDINGS: Lungs are clear. No  pneumothorax or pleural effusion. Cardiac size is mildly enlarged, unchanged. Right internal jugular hemodialysis catheter has been removed. Left internal jugular hemodialysis catheter tip noted within the superior vena cava. The pulmonary vascularity is normal. IMPRESSION: Left internal jugular hemodialysis catheter tip within the superior vena cava. No pneumothorax. Stable cardiomegaly Electronically  Signed   By: Fidela Salisbury M.D.   On: 01/04/2021 14:05   HYBRID OR IMAGING ([facility] ONLY)  Result Date: 01/04/2021 There is no interpretation for this exam.  This order is for images obtained during a surgical procedure.  Please See "Surgeries" Tab for more information regarding the procedure.     Medical Consultants:   None.   Subjective:    Kevin Meyer patient really wants to go home today.  Objective:    Vitals:   01/04/21 2041 01/04/21 2342 01/05/21 0454 01/05/21 0734  BP: 136/69 129/62 112/74 129/78  Pulse: 89 89 89 98  Resp: '18 16 18 18  '$ Temp: 98.4 F (36.9 C) 98.3 F (36.8 C) 98.5 F (36.9 C) 98.5 F (36.9 C)  TempSrc: Oral Oral Oral Oral  SpO2: 95% 95% 94% 100%  Weight:   102.6 kg   Height:       SpO2: 100 % O2 Flow Rate (L/min): 1 L/min FiO2 (%): 21 %   Intake/Output Summary (Last 24 hours) at 01/05/2021 0802 Last data filed at 01/04/2021 1702 Gross per 24 hour  Intake 285.33 ml  Output 1505 ml  Net -1219.67 ml    Filed Weights   01/04/21 0406 01/04/21 1352 01/05/21 0454  Weight: 104.2 kg 104.2 kg 102.6 kg    Exam: General exam: In no acute distress. Respiratory system: Good air movement and clear to auscultation. Cardiovascular system: S1 & S2 heard, RRR. No JVD. Gastrointestinal system: Abdomen is nondistended, soft and nontender.  Extremities: No pedal edema. Skin: No rashes, lesions or ulcers Psychiatry: Judgement and insight appear normal. Mood & affect appropriate. Data Reviewed:    Labs: Basic Metabolic Panel: Recent Labs  Lab 12/30/20 1636 12/31/20 0122 01/01/21 0146 01/02/21 0055 01/03/21 0043 01/04/21 0155 01/05/21 0131  NA 130* 132*  --  134*  --  133*  --   K 4.6 4.0  --  3.8  --  4.0  --   CL 95* 92*  --  98  --  96*  --   CO2 24 29  --  29  --  25  --   GLUCOSE 229* 145*  --  164*  --  147*  --   BUN 65* 24*  --  25*  --  54*  --   CREATININE 7.26* 3.89*  --  4.26*  --  7.64*  --   CALCIUM 8.8* 7.8*  --  8.6*  --  8.6*   --   MG  --  2.2 2.4 2.4 2.5* 2.7* 2.4  PHOS 3.2 2.2* 4.1 3.2 4.4 5.6* 4.0    GFR Estimated Creatinine Clearance: 13 mL/min (A) (by C-G formula based on SCr of 7.64 mg/dL (H)). Liver Function Tests: Recent Labs  Lab 12/30/20 1636 12/31/20 0122  AST  --  74*  ALT  --  70*  ALKPHOS  --  57  BILITOT  --  0.3  PROT  --  6.3*  ALBUMIN 3.0* 2.9*    No results for input(s): LIPASE, AMYLASE in the last 168 hours. No results for input(s): AMMONIA in the last 168 hours. Coagulation profile No results for input(s): INR, PROTIME in the last  168 hours. COVID-19 Labs  No results for input(s): DDIMER, FERRITIN, LDH, CRP in the last 72 hours.  Lab Results  Component Value Date   SARSCOV2NAA NEGATIVE 12/24/2020   [place] NEGATIVE 09/01/2020   [place] NEGATIVE 08/18/2020   [place] NEGATIVE 06/22/2020    CBC: Recent Labs  Lab 01/01/21 0146 01/02/21 0055 01/03/21 0043 01/04/21 0155 01/05/21 0131  WBC 7.8 9.2 7.2 7.4 6.8  NEUTROABS 5.1 6.4 4.5 4.5 4.6  HGB 8.5* 9.1* 9.1* 9.0* 8.7*  HCT 27.2* 28.4* 27.8* 27.7* 27.3*  MCV 93.5 93.7 92.1 92.0 91.9  PLT 179 189 193 227 196    Cardiac Enzymes: No results for input(s): CKTOTAL, CKMB, CKMBINDEX, TROPONINI in the last 168 hours. BNP (last 3 results) No results for input(s): PROBNP in the last 8760 hours. CBG: Recent Labs  Lab 01/04/21 0926 01/04/21 1122 01/04/21 1800 01/04/21 2123 01/05/21 0617  GLUCAP 143* 144* 147* 183* 120*    D-Dimer: No results for input(s): DDIMER in the last 72 hours. Hgb A1c: No results for input(s): HGBA1C in the last 72 hours. Lipid Profile: No results for input(s): CHOL, HDL, LDLCALC, TRIG, CHOLHDL, LDLDIRECT in the last 72 hours. Thyroid function studies: No results for input(s): TSH, T4TOTAL, T3FREE, THYROIDAB in the last 72 hours.  Invalid input(s): FREET3 Anemia work up: No results for input(s): VITAMINB12, FOLATE, FERRITIN, TIBC, IRON, RETICCTPCT in the last 72  hours. Sepsis Labs: Recent Labs  Lab 01/02/21 0055 01/03/21 0043 01/04/21 0155 01/05/21 0131  WBC 9.2 7.2 7.4 6.8    Microbiology Recent Results (from the past 240 hour(s))  Culture, blood (routine x 2)     Status: None   Collection Time: 12/26/20  1:52 PM   Specimen: BLOOD  Result Value Ref Range Status   Specimen Description BLOOD BLOOD LEFT FOREARM  Final   Special Requests   Final    BOTTLES DRAWN AEROBIC AND ANAEROBIC Blood Culture adequate volume   Culture   Final    NO GROWTH 5 DAYS Performed at [facility], [address], [place], [place] [address]    Report Status 12/31/2020 FINAL  Final  Culture, blood (routine x 2)     Status: None   Collection Time: 12/26/20  3:26 PM   Specimen: BLOOD  Result Value Ref Range Status   Specimen Description BLOOD RIGHT ANTECUBITAL  Final   Special Requests   Final    BOTTLES DRAWN AEROBIC ONLY Blood Culture adequate volume   Culture   Final    NO GROWTH 5 DAYS Performed at [facility], [place] [address], [place], [place] [address]    Report Status 12/31/2020 FINAL  Final  Surgical pcr screen     Status: None   Collection Time: 12/29/20 12:35 PM   Specimen: Nasal Mucosa; Nasal Swab  Result Value Ref Range Status   MRSA, PCR NEGATIVE NEGATIVE Final   Staphylococcus aureus NEGATIVE NEGATIVE Final    Comment: (NOTE) The Xpert SA Assay (FDA approved for NASAL specimens in patients 29 years of age and older), is one component of a comprehensive surveillance program. It is not intended to diagnose infection nor to guide or monitor treatment. Performed at [facility], [place] [address], [place], [place] [address]   Cath Tip Culture     Status: None   Collection Time: 01/01/21  3:07 PM   Specimen: Catheter Tip; Other  Result Value Ref Range Status   Specimen Description CATH TIP  Final   Special  Requests NONE  Final   Culture   Final    NO GROWTH 3 DAYS Performed at [facility], [place] [address]  [place], [place] [address]    Report Status 01/04/2021 FINAL  Final     Medications:    allopurinol  100 mg Oral Daily   aspirin EC  81 mg Oral Daily   atorvastatin  40 mg Oral QHS   calcitRIOL  0.5 mcg Oral Q M,W,F-HD   Chlorhexidine Gluconate Cloth  6 each Topical Q0600   Chlorhexidine Gluconate Cloth  6 each Topical Q0600   cholecalciferol  2,000 Units Oral Daily   darbepoetin (ARANESP) injection - DIALYSIS  60 mcg Intravenous Q Mon-HD   docusate sodium  100 mg Oral BID   feeding supplement (NEPRO CARB STEADY)  237 mL Oral BID BM   folic acid  1 mg Oral Daily   heparin  5,000 Units Subcutaneous Q8H   insulin aspart  0-5 Units Subcutaneous QHS   insulin aspart  0-6 Units Subcutaneous TID WC   insulin aspart  3 Units Subcutaneous TID WC   insulin glargine-yfgn  20 Units Subcutaneous Daily   midodrine  5 mg Oral Once per day on Mon Wed Fri   multivitamin  1 tablet Oral QHS   cyanocobalamin  1,000 mcg Oral Daily   Continuous Infusions:      LOS: 12 days   Charlynne Cousins  [facility]  01/05/2021, 8:02 AM

## 2021-01-05 NOTE — Progress Notes (Signed)
D/C instructions given to patient. Medications reviewed. All questions answered. IV removed, clean and intact.   Clyde Canterbury, RN

## 2021-01-05 NOTE — Progress Notes (Signed)
[facility] for Infectious Disease  Date of Admission:  12/24/2020           Reason for visit: Follow up on Enterobacter CRBSI  Cefepime 9/8-9/12    ASSESSMENT:    50 y.o. male admitted with:  Enterobacter line infection with negative peripheral blood cultures.  He underwent line removal 01/01/2021 with doses of cefepime on that date as well as a dose with dialysis yesterday on 01/04/2021 after a new central line was placed.  RECOMMENDATIONS:    Unclear if had true line infection given negative peripheral cultures or if this was just colonization of his old dialysis line.  Nonetheless his previous line was removed and he received doses of cefepime post line removal with most recent dose being yesterday 9/12 which will provide him with 5 days of antibiotics post line removal.  No further antibiotics needed and no objection to discharge from ID standpoint.   Active Problems:   Hypertension   Type 2 diabetes with nephropathy (HCC)   Class 2 obesity due to excess calories with body mass index (BMI) of 39.0 to 39.9 in adult   Hyperlipidemia   Osteoarthritis   Diabetes mellitus type 2, controlled, with complications ([place])   Infection of finger LEFT   Finger infection    MEDICATIONS:    Scheduled Meds: . allopurinol  100 mg Oral Daily  . aspirin EC  81 mg Oral Daily  . atorvastatin  40 mg Oral QHS  . calcitRIOL  0.5 mcg Oral Q M,W,F-HD  . Chlorhexidine Gluconate Cloth  6 each Topical Q0600  . Chlorhexidine Gluconate Cloth  6 each Topical Q0600  . cholecalciferol  2,000 Units Oral Daily  . darbepoetin (ARANESP) injection - DIALYSIS  60 mcg Intravenous Q Mon-HD  . docusate sodium  100 mg Oral BID  . feeding supplement (NEPRO CARB STEADY)  237 mL Oral BID BM  . folic acid  1 mg Oral Daily  . heparin  5,000 Units Subcutaneous Q8H  . insulin aspart  0-5 Units Subcutaneous QHS  . insulin aspart  0-6 Units Subcutaneous TID WC  . insulin aspart  3 Units Subcutaneous TID WC   . insulin glargine-yfgn  20 Units Subcutaneous Daily  . midodrine  5 mg Oral Once per day on Mon Wed Fri  . multivitamin  1 tablet Oral QHS  . cyanocobalamin  1,000 mcg Oral Daily   Continuous Infusions: PRN Meds:.acetaminophen, hydrALAZINE, ondansetron **OR** ondansetron (ZOFRAN) IV, sorbitol  SUBJECTIVE:   Patient with no new complaints this morning.  He has no fevers or chills.  He had a Placed yesterday.  He is hopeful for discharge soon.  Review of Systems  All other systems reviewed and are negative.    OBJECTIVE:   Blood pressure 129/78, pulse 98, temperature 98.5 F (36.9 C), temperature source Oral, resp. rate 18, height '5\' 6"'$  (1.676 m), weight 102.6 kg, SpO2 100 %. Body mass index is 36.51 kg/m.  Physical Exam Constitutional:      General: He is not in acute distress.    Appearance: Normal appearance.  HENT:     Head: Normocephalic and atraumatic.  Pulmonary:     Effort: Pulmonary effort is normal. No respiratory distress.  Musculoskeletal:     Comments: Tunneled dialysis line  Skin:    General: Skin is warm and dry.     Comments: Ischemic changes noted to hand  Neurological:     General: No focal deficit present.  Mental Status: He is alert and oriented to person, place, and time.  Psychiatric:        Mood and Affect: Mood normal.        Behavior: Behavior normal.     Lab Results: Lab Results  Component Value Date   WBC 6.8 01/05/2021   HGB 8.7 (L) 01/05/2021   HCT 27.3 (L) 01/05/2021   MCV 91.9 01/05/2021   PLT 196 01/05/2021    Lab Results  Component Value Date   NA 134 (L) 01/05/2021   K 3.6 01/05/2021   CO2 28 01/05/2021   GLUCOSE 141 (H) 01/05/2021   BUN 33 (H) 01/05/2021   CREATININE 5.42 (H) 01/05/2021   CALCIUM 8.5 (L) 01/05/2021   GFRNONAA 12 (L) 01/05/2021   GFRAA 50 (L) 09/10/2017    Lab Results  Component Value Date   ALT 70 (H) 12/31/2020   AST 74 (H) 12/31/2020   ALKPHOS 57 12/31/2020   BILITOT 0.3 12/31/2020        Component Value Date/Time   CRP 8.5 (H) 12/24/2020 1013       Component Value Date/Time   ESRSEDRATE 45 (H) 12/24/2020 1013     I have reviewed the micro and lab results in Epic.  Imaging: DG Chest Port 1 View  Result Date: 01/04/2021 CLINICAL DATA:  Status post dialysis catheter insertion EXAM: PORTABLE CHEST 1 VIEW COMPARISON:  12/24/2020 FINDINGS: Lungs are clear. No pneumothorax or pleural effusion. Cardiac size is mildly enlarged, unchanged. Right internal jugular hemodialysis catheter has been removed. Left internal jugular hemodialysis catheter tip noted within the superior vena cava. The pulmonary vascularity is normal. IMPRESSION: Left internal jugular hemodialysis catheter tip within the superior vena cava. No pneumothorax. Stable cardiomegaly Electronically Signed   By: Fidela Salisbury M.D.   On: 01/04/2021 14:05   HYBRID OR IMAGING ([facility] ONLY)  Result Date: 01/04/2021 There is no interpretation for this exam.  This order is for images obtained during a surgical procedure.  Please See "Surgeries" Tab for more information regarding the procedure.     Imaging independently reviewed in Epic.    Rayn[facility] [phone] pager 01/05/2021, 10:38 AM  I spent greater than 35 minutes with the patient including greater than 50% of time in face to face counsel of the patient and in coordination of their care.

## 2021-01-05 NOTE — Progress Notes (Signed)
[facility] Progress Note   Subjective:    Late entry for patient seen early this am.  He was discharged prior to extender's rounds.  Felt well. Got tunneled catheter yesterday and stated HD went well.  He states he was told he would be getting abx with outpatient hd.   Review of systems: denies n/v No shortness of breath or chest pain  Objective Vitals:   01/04/21 2041 01/04/21 2342 01/05/21 0454 01/05/21 0734  BP: 136/69 129/62 112/74 129/78  Pulse: 89 89 89 98  Resp: '18 16 18 18  '$ Temp: 98.4 F (36.9 C) 98.3 F (36.8 C) 98.5 F (36.9 C) 98.5 F (36.9 C)  TempSrc: Oral Oral Oral Oral  SpO2: 95% 95% 94% 100%  Weight:   102.6 kg   Height:       Physical Exam General adult male in bed in no acute distress HEENT normocephalic atraumatic extraocular movements intact sclera anicteric Neck supple trachea midline Lungs clear to auscultation bilaterally normal work of breathing at rest on RA Heart S1S2 no rub Abdomen soft nontender nondistended Extremities no edema  Psych normal mood and affect Neuro - alert and oriented x 3 provides hx and follows commands Access tunn catheter left IJ in place  [facility] Weights   01/04/21 0406 01/04/21 1352 01/05/21 0454  Weight: 104.2 kg 104.2 kg 102.6 kg    Intake/Output Summary (Last 24 hours) at 01/05/2021 1608 Last data filed at 01/05/2021 0846 Gross per 24 hour  Intake 240 ml  Output 1500 ml  Net -1260 ml    Additional Objective Labs: Basic Metabolic Panel: Recent Labs  Lab 01/02/21 0055 01/03/21 0043 01/04/21 0155 01/05/21 0131  NA 134*  --  133* 134*  K 3.8  --  4.0 3.6  CL 98  --  96* 95*  CO2 29  --  25 28  GLUCOSE 164*  --  147* 141*  BUN 25*  --  54* 33*  CREATININE 4.26*  --  7.64* 5.42*  CALCIUM 8.6*  --  8.6* 8.5*  PHOS 3.2 4.4 5.6* 4.0   Liver Function Tests: Recent Labs  Lab 12/30/20 1636 12/31/20 0122  AST  --  74*  ALT  --  70*  ALKPHOS  --  57  BILITOT  --  0.3  PROT  --  6.3*   ALBUMIN 3.0* 2.9*   No results for input(s): LIPASE, AMYLASE in the last 168 hours. CBC: Recent Labs  Lab 01/01/21 0146 01/02/21 0055 01/03/21 0043 01/04/21 0155 01/05/21 0131  WBC 7.8 9.2 7.2 7.4 6.8  NEUTROABS 5.1 6.4 4.5 4.5 4.6  HGB 8.5* 9.1* 9.1* 9.0* 8.7*  HCT 27.2* 28.4* 27.8* 27.7* 27.3*  MCV 93.5 93.7 92.1 92.0 91.9  PLT 179 189 193 227 196   Blood Culture    Component Value Date/Time   SDES CATH TIP 01/01/2021 1507   SPECREQUEST NONE 01/01/2021 1507   CULT  01/01/2021 1507    NO GROWTH 3 DAYS Performed at [facility] [address], [place], [place] [address]    REPTSTATUS 01/04/2021 FINAL 01/01/2021 1507    Cardiac Enzymes: No results for input(s): CKTOTAL, CKMB, CKMBINDEX, TROPONINI in the last 168 hours. CBG: Recent Labs  Lab 01/04/21 0926 01/04/21 1122 01/04/21 1800 01/04/21 2123 01/05/21 0617  GLUCAP 143* 144* 147* 183* 120*   Iron Studies: No results for input(s): IRON, TIBC, TRANSFERRIN, FERRITIN in the last 72 hours. Lab Results  Component Value Date   INR 1.0  12/24/2020   INR 1.3 (H) 06/23/2020   Studies/Results: DG Chest Port 1 View  Result Date: 01/04/2021 CLINICAL DATA:  Status post dialysis catheter insertion EXAM: PORTABLE CHEST 1 VIEW COMPARISON:  12/24/2020 FINDINGS: Lungs are clear. No pneumothorax or pleural effusion. Cardiac size is mildly enlarged, unchanged. Right internal jugular hemodialysis catheter has been removed. Left internal jugular hemodialysis catheter tip noted within the superior vena cava. The pulmonary vascularity is normal. IMPRESSION: Left internal jugular hemodialysis catheter tip within the superior vena cava. No pneumothorax. Stable cardiomegaly Electronically Signed   By: Fidela Salisbury M.D.   On: 01/04/2021 14:05   HYBRID OR IMAGING ([facility] ONLY)  Result Date: 01/04/2021 There is no interpretation for this exam.  This order is for images obtained during a surgical procedure.  Please See "Surgeries" Tab  for more information regarding the procedure.    Medications:    allopurinol  100 mg Oral Daily   aspirin EC  81 mg Oral Daily   atorvastatin  40 mg Oral QHS   calcitRIOL  0.5 mcg Oral Q M,W,F-HD   Chlorhexidine Gluconate Cloth  6 each Topical Q0600   Chlorhexidine Gluconate Cloth  6 each Topical Q0600   cholecalciferol  2,000 Units Oral Daily   darbepoetin (ARANESP) injection - DIALYSIS  60 mcg Intravenous Q Mon-HD   docusate sodium  100 mg Oral BID   feeding supplement (NEPRO CARB STEADY)  237 mL Oral BID BM   folic acid  1 mg Oral Daily   heparin  5,000 Units Subcutaneous Q8H   insulin aspart  0-5 Units Subcutaneous QHS   insulin aspart  0-6 Units Subcutaneous TID WC   insulin aspart  3 Units Subcutaneous TID WC   insulin glargine-yfgn  20 Units Subcutaneous Daily   midodrine  5 mg Oral Once per day on Mon Wed Fri   multivitamin  1 tablet Oral QHS   cyanocobalamin  1,000 mcg Oral Daily    Dialysis Orders: MWF at [place] 4hr, 400/600, EDW 99.5kg, 2K/2.5Ca, AVF + TDC, heparin 5000 - Calcitriol 0.54mg PO q HD - No ESA   Assessment/Plan: 1. Chills/rigors/vomiting: blood cultures obtained from patient's outpatient HD center (12/23/20)) +enterobacter cloacae complex.  [facility] S/P L tunneled HD catheter on 9/12.  Spoke with primary team.  Per the ID note no further abx needed.   Spoke with primary and TTE deferred as not bacteremic - blood cultures were not positive here on review. 2. 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  Left hand improving. 3. ESRD: HD per MWF schedule 4. HTN/volume: BP in goal.  Not on antihypertensives.  On midodrine pre HD - not on home med list but may not be accurate.  Does not appear volume overloaded.  UF as tolerated.  5. Anemia: aranesp 665m on 9/12 6. Secondary hyperparathyroidism: Corrected Ca ok.  Phos in goal without binders.  Continue VDRA.   LoClaudia DesanctisMD 01/05/2021  4:08 PM

## 2021-01-05 NOTE — Progress Notes (Addendum)
Progress Note    01/05/2021 7:49 AM 1 Day Post-Op  Subjective:  Hand continue to improve. No problems with HD yesterday after left TDC insertion.   Vitals:   01/04/21 2342 01/05/21 0454  BP: 129/62 112/74  Pulse: 89 89  Resp: 16 18  Temp: 98.3 F (36.8 C) 98.5 F (36.9 C)  SpO2: 95% 94%    Physical Exam: General appearance: awake, alert in NAD Chest: catheter site without bleeding or erythema. Dressing dry and intact. Respirations: unlabored; no dyspnea at rest Left upper extremity: Hand is warm with 5/5 grip strength. Motor function and sensation intact. Good bruit and thrill in fistula. 1+ radial pulse. Ischemic changes to 1st, 2nd, 5th digit dry and stable Incision(s): Well approximated. No active bleeding or hematoma.         EXAM: PORTABLE CHEST 1 VIEW   COMPARISON:  12/24/2020   FINDINGS: Lungs are clear. No pneumothorax or pleural effusion. Cardiac size is mildly enlarged, unchanged. Right internal jugular hemodialysis catheter has been removed. Left internal jugular hemodialysis catheter tip noted within the superior vena cava. The pulmonary vascularity is normal.   IMPRESSION: Left internal jugular hemodialysis catheter tip within the superior vena cava. No pneumothorax.   Stable cardiomegaly     Electronically Signed   By: Fidela Salisbury M.D.   On: 01/04/2021 14:05    CBC    Component Value Date/Time   WBC 6.8 01/05/2021 0131   RBC 2.97 (L) 01/05/2021 0131   HGB 8.7 (L) 01/05/2021 0131   HCT 27.3 (L) 01/05/2021 0131   PLT 196 01/05/2021 0131   MCV 91.9 01/05/2021 0131   MCH 29.3 01/05/2021 0131   MCHC 31.9 01/05/2021 0131   RDW 15.6 (H) 01/05/2021 0131   LYMPHSABS 1.4 01/05/2021 0131   MONOABS 0.5 01/05/2021 0131   EOSABS 0.2 01/05/2021 0131   BASOSABS 0.0 01/05/2021 0131    BMET    Component Value Date/Time   NA 133 (L) 01/04/2021 0155   K 4.0 01/04/2021 0155   CL 96 (L) 01/04/2021 0155   CO2 25 01/04/2021 0155   GLUCOSE  147 (H) 01/04/2021 0155   BUN 54 (H) 01/04/2021 0155   CREATININE 7.64 (H) 01/04/2021 0155   CALCIUM 8.6 (L) 01/04/2021 0155   GFRNONAA 8 (L) 01/04/2021 0155   GFRAA 50 (L) 09/10/2017 0449     Intake/Output Summary (Last 24 hours) at 01/05/2021 0749 Last data filed at 01/04/2021 1702 Gross per 24 hour  Intake 285.33 ml  Output 1505 ml  Net -1219.67 ml    HOSPITAL MEDICATIONS Scheduled Meds:  allopurinol  100 mg Oral Daily   aspirin EC  81 mg Oral Daily   atorvastatin  40 mg Oral QHS   calcitRIOL  0.5 mcg Oral Q M,W,F-HD   Chlorhexidine Gluconate Cloth  6 each Topical Q0600   Chlorhexidine Gluconate Cloth  6 each Topical Q0600   cholecalciferol  2,000 Units Oral Daily   darbepoetin (ARANESP) injection - DIALYSIS  60 mcg Intravenous Q Mon-HD   docusate sodium  100 mg Oral BID   feeding supplement (NEPRO CARB STEADY)  237 mL Oral BID BM   folic acid  1 mg Oral Daily   heparin  5,000 Units Subcutaneous Q8H   insulin aspart  0-5 Units Subcutaneous QHS   insulin aspart  0-6 Units Subcutaneous TID WC   insulin aspart  3 Units Subcutaneous TID WC   insulin glargine-yfgn  20 Units Subcutaneous Daily   midodrine  5 mg Oral  Once per day on Mon Wed Fri   multivitamin  1 tablet Oral QHS   cyanocobalamin  1,000 mcg Oral Daily   Continuous Infusions: PRN Meds:.acetaminophen, hydrALAZINE, ondansetron **OR** ondansetron (ZOFRAN) IV, sorbitol  Assessment and Plan: POD 6 LUE DRIL procedure for steal syndrome. Symptoms essentially resolved. Ischemic ulcers dry and stable. POD 1 Left IJ TDC OK to discharge home from vascular standpoint. He has [facility] follow-up appt on 10/12 at 0900.  -DVT prophylaxis:  Heparin [place]   Risa Grill, PA-C [facility] [phone] 01/05/2021  7:49 AM   VASCULAR STAFF ADDENDUM: I have independently interviewed and examined the patient. I agree with the above.  Ready for DC He prefers follow up in [place].   Yevonne Aline. Stanford Breed,  MD [facility] Phone Number: [phone] 01/05/2021 8:06 AM

## 2021-01-05 NOTE — Discharge Summary (Signed)
Physician Discharge Summary  Ignatius Kifer [id number] DOB: 1971-02-02 DOA: 12/24/2020  PCP: Royce Macadamia D., PA-C  Admit date: 12/24/2020 Discharge date: 01/05/2021  Admitted From: Home Disposition:  Home  Recommendations for Outpatient Follow-up:  Follow up with PCP in 1-2 weeks, will recommend transitioning his Amaryl to insulin regimen for better control.  Last A1c in the hospital was 7.2. Please obtain BMP/CBC in one week   Home Health:No Equipment/Devices:none  Discharge Condition:Stable CODE STATUS:Full Diet recommendation: Heart Healthy   Brief/Interim Summary: 50 y.o. male past medical history of essential hypertension, hyperlipidemia gout diabetes mellitus type 2 diabetic nephropathy end-stage renal disease who dialyzes Monday Wednesday and Friday, status post a right-sided tunneled catheter with a life AV fistula, chronic systolic heart failure comes into the ED at [facility] for fevers and left hand pain and 2 ulcers of the left hand that have developed over the last 3 weeks transferred to call for Vascular surgery was consulted angiogram performed the patient underwent DRIL procedure. He developed sepsis started on IV Vanco and Keflex. He continues to have intermittent fevers but no obvious source was identified.  Infectious disease was consulted, he subsequently started developing rigors blood cultures were ordered and grew enterococci on 12/23/2020. HD catheter was removed and he is now on IV cefepime and has remained afebrile since then.  Antibiotics: IV cefepime   Microbiology data: 12/23/2020 blood culture: Enterobacter 12/26/2020 blood cultures negative till date.   Procedures: 12/25/2020 arteriogram with selective catheterization of the left brachial artery that showed minimal narrowing at the proximal end of the fistula and moderate diffuse radial disease. 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 He was started on IV vancomycin and Keflex for his sepsis and his cultures grew Enterobacter at an outside facility. He was changed to IV cefepime for which she can completed his treatment in house. HD catheter was removed and he was given a line holiday was replaced on 01/04/2021. ID was consulted and agree with management.  Elevated LFTs: There was a concern for exposure for hepatitis B, hepatitis work-up was negative LFTs are trending down.  End-stage renal disease: Continue hemodialysis per renal.  Hypokalemia: Corrected with dialysis.  Chronic hypotension: He was continued on midodrine.  Diabetes mellitus type 2 leading to end-stage renal disease: He was covered with insulin in the hospital he was transitioned to his oral hypoglycemic agent as an outpatient. He will follow-up with his PCP and it will be a good idea to transition him to an insulin regimen as an outpatient to get better control of his blood glucose.  Dyslipidemia: Signs and symptoms.  Discharge Instructions  Discharge Instructions     Diet - low sodium heart healthy   Complete by: As directed    Increase activity slowly   Complete by: As directed    No wound care   Complete by: As directed  Allergies as of 01/05/2021       Reactions   Gabapentin Anaphylaxis   Lyrica [pregabalin] Anaphylaxis        Medication List      TAKE these medications    acetaminophen 500 MG tablet Commonly known as: TYLENOL Take 1,000 mg by mouth every 6 (six) hours as needed for moderate pain or headache.   allopurinol 100 MG tablet Commonly known as: ZYLOPRIM Take 100 mg by mouth daily.   aspirin EC 81 MG tablet Take 81 mg by mouth daily.   atorvastatin 40 MG tablet Commonly known as: LIPITOR Take 40 mg by mouth at bedtime.   calcium acetate 667 MG capsule Commonly known as: PHOSLO Take 1,334 mg by mouth 3 (three) times daily with meals.   cyanocobalamin 1000 MCG tablet Take 1 tablet (1,000 mcg total) by mouth daily.   folic acid 1 MG tablet Commonly known as: FOLVITE Take 1 tablet (1 mg total) by mouth daily.   glipiZIDE 10 MG tablet Commonly known as: GLUCOTROL Take 1 tablet (10 mg total) by mouth 2 (two) times daily before a meal.   midodrine 5 MG tablet Commonly known as: PROAMATINE Take 1 tablet (5 mg total) by mouth 3 (three) times a week. Start taking on: January 06, 2021   multivitamin Tabs tablet Take 1 tablet by mouth daily.   Retacrit 3000 UNIT/ML injection Generic drug: epoetin alfa-epbx 3,000 Units every 14 (fourteen) days.   Trulicity [id number] Sopn Generic drug: Dulaglutide Inject 0.75 mg into the skin every Thursday.   Velphoro 500 MG chewable tablet Generic drug: sucroferric oxyhydroxide Chew 500 mg by mouth 3 (three) times daily.   Vitamin D 50 MCG (2000 UT) Caps Take 2,000 Units by mouth daily.        Allergies  Allergen Reactions   Gabapentin Anaphylaxis   Lyrica [Pregabalin] Anaphylaxis    Consultations: Vascular surgery Nephrology Infectious disease    Procedures/Studies: PERIPHERAL VASCULAR CATHETERIZATION  Result Date: 12/25/2020 Table formatting from the original result was not included. PATIENT: Dimitric Kumer      MRN: [id number] DOB: 1970-04-26    DATE OF PROCEDURE: 12/25/2020 INDICATIONS:  Rahi Delagrange is a 50 y.o. male who had a left brachiocephalic  fistula placed earlier this year.  He had significant steal symptoms and was seen in consultation.  He is being considered for a DRIL procedure and was set up for an upper extremity arteriogram PROCEDURE:  Conscious sedation Ultrasound-guided access to the right common femoral artery Arch aortogram Selective catheterization of the left brachial artery with left upper extremity runoff Mynx closure right common femoral artery SURGEON: Judeth Cornfield. Scot Dock, MD, FACS ANESTHESIA: Local with sedation EBL: Minimal TECHNIQUE: The patient was brought to the peripheral vascular lab and was sedated. The period of conscious sedation was 47 minutes.  During that time period, I was present face-to-face 100% of the time.  The patient was administered 1 mg of Versed and 50 mcg of fentanyl. The patient's heart rate, blood pressure, and oxygen saturation were monitored by the nurse continuously during the procedure. Both groins were prepped and draped in the usual sterile fashion.  Under ultrasound guidance, after the skin was anesthetized, I cannulated the right common femoral artery with a micropuncture needle and a micropuncture sheath was introduced over a wire.  This was exchanged for a 5 [place] sheath over a Bentson wire.  By ultrasound the femoral artery was patent. A real-time image was obtained and sent to the server. A pigtail  catheter was positioned in the ascending aorta and an arch aortogram was obtained in a 40 degree LAO projection.  I then selected the left subclavian artery and advanced the wire down past the vertebral into the brachial artery.  Selective left upper extremity arteriogram was then obtained.  We also obtain films with compression of the fistula.  At the completion the catheter was removed.  The Mynx device was passed through the sheath and then deployed in the standard fashion.  There was good hemostasis. FINDINGS: No significant disease of the aortic arch, innominate, left proximal common carotid  artery, and left proximal subclavian artery. The left subclavian, axillary, and brachial arteries are widely patent. The fistula is patent with several large competing branches.  There is minimal narrowing at the proximal end of the fistula. There is an early takeoff of the radial artery just beyond the anastomosis.  The radial artery is somewhat small with some moderate diffuse disease.  It fills the palmar arch in the hand. There is an interosseous branch which occludes above the wrist.  The ulnar artery is patent to the hand.   DG Chest Port 1 View  Result Date: 01/04/2021 CLINICAL DATA:  Status post dialysis catheter insertion EXAM: PORTABLE CHEST 1 VIEW COMPARISON:  12/24/2020 FINDINGS: Lungs are clear. No pneumothorax or pleural effusion. Cardiac size is mildly enlarged, unchanged. Right internal jugular hemodialysis catheter has been removed. Left internal jugular hemodialysis catheter tip noted within the superior vena cava. The pulmonary vascularity is normal. IMPRESSION: Left internal jugular hemodialysis catheter tip within the superior vena cava. No pneumothorax. Stable cardiomegaly Electronically Signed   By: Fidela Salisbury M.D.   On: 01/04/2021 14:05   DG Chest Port 1 View  Result Date: 12/24/2020 CLINICAL DATA:  Low-grade fever and shaking during dialysis EXAM: PORTABLE CHEST 1 VIEW COMPARISON:  Chest radiograph 09/03/2020 FINDINGS: The heart is enlarged, unchanged. The mediastinal contours are stable. A right sided dialysis catheter is in stable position terminating in the right atrium. There is no focal consolidation or pulmonary edema. There is no pleural effusion or pneumothorax. There is no acute osseous abnormality. IMPRESSION: 1. No radiographic evidence of acute cardiopulmonary process. 2. Unchanged cardiomegaly. Electronically Signed   By: Valetta Mole M.D.   On: 12/24/2020 10:38   VAS US DUPLEX DIALYSIS ACCESS (AVF, AVG)  Result Date: 12/29/2020 DIALYSIS ACCESS Patient Name:  CASTER WALDINGER  Date of Exam:   12/29/2020 Medical Rec #: [id number]    Accession #:    [id number] Date of Birth: [birth year]     Patient Gender: M Patient Age:   16 years Exam Location:  [facility] Procedure:      VAS US DUPLEX DIALYSIS ACCESS (AVF, AVG) Referring Phys: Jamelle Haring --------------------------------------------------------------------------------  Reason for Exam: Left digit ulcers. Access Site: Left Upper Extremity. Access Type: Brachial-cephalic AVF. Comparison Study: No prior study Performing Technologist: Maudry Mayhew MHA, RDMS, RVT, RDCS  Examination Guidelines: A complete evaluation includes B-mode imaging, spectral Doppler, color Doppler, and power Doppler as needed of all accessible portions of each vessel. Unilateral testing is considered an integral part of a complete examination. Limited examinations for reoccurring indications may be performed as noted.  Findings: +--------------------+----------+-----------------+--------+ AVF                 PSV (cm/s)Flow Vol (mL/min)Comments +--------------------+----------+-----------------+--------+ Native artery inflow   181           259                +--------------------+----------+-----------------+--------+  AVF Anastomosis        162                              +--------------------+----------+-----------------+--------+  +------------+---------+------------+----------+-------------------------------+ OUTFLOW VEIN   PSV     Diameter  Depth (cm)           Describe                          (cm/s)      (cm)                                              +------------+---------+------------+----------+-------------------------------+ Prox UA        404       0.47                                              +------------+---------+------------+----------+-------------------------------+ Mid UA         116       0.69                competing branch measuring                                                            0.37cm              +------------+---------+------------+----------+-------------------------------+ Dist UA        240       0.81                                              +------------+---------+------------+----------+-------------------------------+ Left palmar arch= 57cm/s without AVF compression, 121cm/s with AVF compression   Summary: Patent left brachiocephalic AVF. There is evidence of a competing branch. PW Doppler of left palmar arch with and without compression reveals evidence of steal syndrome.  *See table(s) above for measurements and observations.  Diagnosing physician: Harold Barban MD Electronically signed by Harold Barban MD on 12/29/2020 at 7:21:39 PM.    --------------------------------------------------------------------------------   Final    VAS [place] LOWER EXTREMITY SAPHENOUS VEIN MAPPING  Result Date: 12/25/2020 LOWER EXTREMITY VEIN MAPPING Patient Name:  BARRY LACINA  Date of Exam:   12/25/2020 Medical Rec #: [id number]    Accession #:    [id number] Date of Birth: 1970-06-10     Patient Gender: M Patient Age:   54 years Exam Location:  [facility] Procedure:      VAS [place] LOWER EXTREMITY [place] Referring Phys: Jamelle Haring --------------------------------------------------------------------------------  Indications:  Ulceration LT hand, pre-op evaluation for DRIL procedure Risk Factors: Hypertension, hyperlipidemia, Diabetes.  Comparison Study: No prior studies. Performing Technologist: Darlin Coco RDMS, RVT  Examination Guidelines: A complete evaluation includes B-mode imaging, spectral Doppler, color Doppler, and power Doppler as needed of all accessible portions of each vessel. Bilateral testing is considered an integral part of a complete examination.  Limited examinations for reoccurring indications may be performed as noted. +---------------+-----------+----------------------+---------------+-----------+   RT Diameter  RT Findings         GSV             LT Diameter  LT Findings      (cm)                                            (cm)                  +---------------+-----------+----------------------+---------------+-----------+      0.60                     Saphenofemoral         0.53                                                   Junction                                  +---------------+-----------+----------------------+---------------+-----------+      0.43                     Proximal thigh         0.32                  +---------------+-----------+----------------------+---------------+-----------+      0.26       branching       Mid thigh            0.26                  +---------------+-----------+----------------------+---------------+-----------+      0.28                      Distal thigh          0.26                  +---------------+-----------+----------------------+---------------+-----------+      0.29                          Knee              0.29                  +---------------+-----------+----------------------+---------------+-----------+      0.25                       Prox calf            0.23       branching  +---------------+-----------+----------------------+---------------+-----------+      0.26       branching        Mid calf            0.24                  +---------------+-----------+----------------------+---------------+-----------+      0.26                      Distal calf  0.27                  +---------------+-----------+----------------------+---------------+-----------+      0.24                         Ankle              0.23                  +---------------+-----------+----------------------+---------------+-----------+ Diagnosing physician: Deitra Mayo MD Electronically signed by Deitra Mayo MD on 12/25/2020 at 6:32:49 PM.    Final    VAS [place] STEAL EXAM  Result Date: 12/29/2020 DIALYSIS STEAL  Patient Name:   KHAMARION LEAK  Date of Exam:   12/29/2020 Medical Rec #: [id number]    Accession #:    [id number] Date of Birth: 10/03/1970     Patient Gender: M Patient Age:   30 years Exam [facility]  [facility] Procedure:      VAS [place] STEAL EXAM Referring Phys: Jamelle Haring --------------------------------------------------------------------------------  Reason for Exam: Ulcer. Access Site: Left Upper Extremity. Access Type: Brachial-cephalic AVF. Limitations: Ulcers on multiple left digits Comparison Study: No prior study Performing Technologist: Maudry Mayhew MHA, RVT, RDCS, RDMS  Examination Guidelines: A complete evaluation includes B-mode imaging, spectral Doppler, color Doppler, and power Doppler as needed of all accessible portions of each vessel. Bilateral testing is considered an integral part of a complete examination. Limited examinations for reoccurring indications may be performed as noted.  Findings: +-----------------+-------------+----------+---------+----------+--------------+ Upper Arm        Diameter (cm)Depth (cm)BranchingPSV (cm/s) Flow Volume   Arteriovenous                                                 (ml/min)    Fistula                                                                   +-----------------+-------------+----------+---------+----------+--------------+ Distal Brachial                                     181         259       Artery                                                                    +-----------------+-------------+----------+---------+----------+--------------+ AVF anastomosis                                     162                   +-----------------+-------------+----------+---------+----------+--------------+ outflow vein          0.8  240                   distal upper arm                                                           +-----------------+-------------+----------+---------+----------+--------------+ outflow vein mid      0.7               branching   116                   upper arm                                                                 +-----------------+-------------+----------+---------+----------+--------------+  Competing branches noted in the upper arm arterio venous fistula. Some data imported from Duplex Dialysis Access 12/29/2020  Summary: Palmar arch signal increases with AVF compression, going from 57cm/s to 121cm/s.  Physiologic study: PPG signal increases in both third and fourth left digit with AVF compression. This is suggestive of steal syndrome. *See table(s) above for measurements and observations. Diagnosing physician: Harold Barban MD Electronically signed by Harold Barban MD on 12/29/2020 at 7:23:22 PM.    Final    HYBRID OR IMAGING ([place])  Result Date: 01/04/2021 There is no interpretation for this exam.  This order is for images obtained during a surgical procedure.  Please See "Surgeries" Tab for more information regarding the procedure.   (Echo, Carotid, EGD, Colonoscopy, ERCP)    Subjective: No complaints  Discharge Exam: Vitals:   01/05/21 0454 01/05/21 0734  BP: 112/74 129/78  Pulse: 89 98  Resp: 18 18  Temp: 98.5 F (36.9 C) 98.5 F (36.9 C)  SpO2: 94% 100%   Vitals:   01/04/21 2041 01/04/21 2342 01/05/21 0454 01/05/21 0734  BP: 136/69 129/62 112/74 129/78  Pulse: 89 89 89 98  Resp: '18 16 18 18  '$ Temp: 98.4 F (36.9 C) 98.3 F (36.8 C) 98.5 F (36.9 C) 98.5 F (36.9 C)  TempSrc: Oral Oral Oral Oral  SpO2: 95% 95% 94% 100%  Weight:   102.6 kg   Height:        General: Pt is alert, awake, not in acute distress Cardiovascular: RRR, S1/S2 +, no rubs, no gallops Respiratory: CTA bilaterally, no wheezing, no rhonchi Abdominal: Soft, NT, ND, bowel sounds + Extremities: no edema, no cyanosis    The results of significant diagnostics from this  hospitalization (including imaging, microbiology, ancillary and laboratory) are listed below for reference.     Microbiology: Recent Results (from the past 240 hour(s))  Culture, blood (routine x 2)     Status: None   Collection Time: 12/26/20  1:52 PM   Specimen: BLOOD  Result Value Ref Range Status   Specimen Description BLOOD BLOOD LEFT FOREARM  Final   Special Requests   Final    BOTTLES DRAWN AEROBIC AND ANAEROBIC Blood Culture adequate volume   Culture   Final    NO GROWTH 5 DAYS Performed at [facility], [address], [place], [place] [address]  Report Status 12/31/2020 FINAL  Final  Culture, blood (routine x 2)     Status: None   Collection Time: 12/26/20  3:26 PM   Specimen: BLOOD  Result Value Ref Range Status   Specimen Description BLOOD RIGHT ANTECUBITAL  Final   Special Requests   Final    BOTTLES DRAWN AEROBIC ONLY Blood Culture adequate volume   Culture   Final    NO GROWTH 5 DAYS Performed at [facility], [address], [place], [place] [address]    Report Status 12/31/2020 FINAL  Final  Surgical pcr screen     Status: None   Collection Time: 12/29/20 12:35 PM   Specimen: Nasal Mucosa; Nasal Swab  Result Value Ref Range Status   MRSA, PCR NEGATIVE NEGATIVE Final   Staphylococcus aureus NEGATIVE NEGATIVE Final    Comment: (NOTE) The Xpert SA Assay (FDA approved for NASAL specimens in patients 43 years of age and older), is one component of a comprehensive surveillance program. It is not intended to diagnose infection nor to guide or monitor treatment. Performed at [facility], [place] [address], [place], [place] [address]   Cath Tip Culture     Status: None   Collection Time: 01/01/21  3:07 PM   Specimen: Catheter Tip; Other  Result Value Ref Range Status   Specimen Description CATH TIP  Final   Special Requests NONE  Final   Culture   Final    NO GROWTH 3 DAYS Performed at [facility], [place] [address], [place],  [place] [address]    Report Status 01/04/2021 FINAL  Final     Labs: BNP (last 3 results) Recent Labs    06/22/20 2057  BNP [id number]*   Basic Metabolic Panel: Recent Labs  Lab 12/30/20 1636 12/31/20 0122 01/01/21 0146 01/02/21 0055 01/03/21 0043 01/04/21 0155 01/05/21 0131  NA 130* 132*  --  134*  --  133*  --   K 4.6 4.0  --  3.8  --  4.0  --   CL 95* 92*  --  98  --  96*  --   CO2 24 29  --  29  --  25  --   GLUCOSE 229* 145*  --  164*  --  147*  --   BUN 65* 24*  --  25*  --  54*  --   CREATININE 7.26* 3.89*  --  4.26*  --  7.64*  --   CALCIUM 8.8* 7.8*  --  8.6*  --  8.6*  --   MG  --  2.2 2.4 2.4 2.5* 2.7* 2.4  PHOS 3.2 2.2* 4.1 3.2 4.4 5.6* 4.0   Liver Function Tests: Recent Labs  Lab 12/30/20 1636 12/31/20 0122  AST  --  74*  ALT  --  70*  ALKPHOS  --  57  BILITOT  --  0.3  PROT  --  6.3*  ALBUMIN 3.0* 2.9*   No results for input(s): LIPASE, AMYLASE in the last 168 hours. No results for input(s): AMMONIA in the last 168 hours. CBC: Recent Labs  Lab 01/01/21 0146 01/02/21 0055 01/03/21 0043 01/04/21 0155 01/05/21 0131  WBC 7.8 9.2 7.2 7.4 6.8  NEUTROABS 5.1 6.4 4.5 4.5 4.6  HGB 8.5* 9.1* 9.1* 9.0* 8.7*  HCT 27.2* 28.4* 27.8* 27.7* 27.3*  MCV 93.5 93.7 92.1 92.0 91.9  PLT 179 189 193 227 196   Cardiac Enzymes: No results for input(s): CKTOTAL, CKMB, CKMBINDEX,  TROPONINI in the last 168 hours. BNP: Invalid input(s): POCBNP CBG: Recent Labs  Lab 01/04/21 0926 01/04/21 1122 01/04/21 1800 01/04/21 2123 01/05/21 0617  GLUCAP 143* 144* 147* 183* 120*   D-Dimer No results for input(s): DDIMER in the last 72 hours. Hgb A1c No results for input(s): HGBA1C in the last 72 hours. Lipid Profile No results for input(s): CHOL, HDL, LDLCALC, TRIG, CHOLHDL, LDLDIRECT in the last 72 hours. Thyroid function studies No results for input(s): TSH, T4TOTAL, T3FREE, THYROIDAB in the last 72 hours.  Invalid input(s): FREET3 Anemia work up No results for  input(s): VITAMINB12, FOLATE, FERRITIN, TIBC, IRON, RETICCTPCT in the last 72 hours. Urinalysis    Component Value Date/Time   COLORURINE YELLOW 12/24/2020 1109   APPEARANCEUR HAZY (A) 12/24/2020 1109   LABSPEC >1.030 (H) 12/24/2020 1109   PHURINE 5.0 12/24/2020 1109   GLUCOSEU NEGATIVE 12/24/2020 1109   HGBUR SMALL (A) 12/24/2020 1109   BILIRUBINUR SMALL (A) 12/24/2020 1109   KETONESUR NEGATIVE 12/24/2020 1109   PROTEINUR 100 (A) 12/24/2020 1109   NITRITE NEGATIVE 12/24/2020 1109   LEUKOCYTESUR TRACE (A) 12/24/2020 1109   Sepsis Labs Invalid input(s): PROCALCITONIN,  WBC,  LACTICIDVEN Microbiology Recent Results (from the past 240 hour(s))  Culture, blood (routine x 2)     Status: None   Collection Time: 12/26/20  1:52 PM   Specimen: BLOOD  Result Value Ref Range Status   Specimen Description BLOOD BLOOD LEFT FOREARM  Final   Special Requests   Final    BOTTLES DRAWN AEROBIC AND ANAEROBIC Blood Culture adequate volume   Culture   Final    NO GROWTH 5 DAYS Performed at [facility], [place] [address], [address], [place] [address]    Report Status 12/31/2020 FINAL  Final  Culture, blood (routine x 2)     Status: None   Collection Time: 12/26/20  3:26 PM   Specimen: BLOOD  Result Value Ref Range Status   Specimen Description BLOOD RIGHT ANTECUBITAL  Final   Special Requests   Final    BOTTLES DRAWN AEROBIC ONLY Blood Culture adequate volume   Culture   Final    NO GROWTH 5 DAYS Performed at [facility], [place] [address], [place], [place] [address]    Report Status 12/31/2020 FINAL  Final  Surgical pcr screen     Status: None   Collection Time: 12/29/20 12:35 PM   Specimen: Nasal Mucosa; Nasal Swab  Result Value Ref Range Status   MRSA, PCR NEGATIVE NEGATIVE Final   Staphylococcus aureus NEGATIVE NEGATIVE Final    Comment: (NOTE) The Xpert SA Assay (FDA approved for NASAL specimens in patients 61 years of age and older), is one component of a  comprehensive surveillance program. It is not intended to diagnose infection nor to guide or monitor treatment. Performed at [facility], [place] [address], [place], [place] [address]   Cath Tip Culture     Status: None   Collection Time: 01/01/21  3:07 PM   Specimen: Catheter Tip; Other  Result Value Ref Range Status   Specimen Description CATH TIP  Final   Special Requests NONE  Final   Culture   Final    NO GROWTH 3 DAYS Performed at [facility], [place] [address] [place], [place] [address]    Report Status 01/04/2021 FINAL  Final     Time coordinating discharge: Over 30 minutes  SIGNED:   Charlynne Cousins, MD  [facility]  01/05/2021, 9:37 AM Pager   If 7PM-7AM, please contact night-coverage [email or web address] Password [email or web address]

## 2021-01-06 NOTE — TOC Transition Note (Signed)
Transition of care contact from inpatient facility  Date of discharge: 01/05/21 Date of contact: 01/06/21 Method: Phone Spoke to: Patient's Mother   Patient contacted to discuss transition of care from recent inpatient hospitalization. Patient currently at HD and spoke to his mother Kevin Meyer. 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  Medication changes were reviewed.  Patient receiving HD at [facility] today. Plan for renal team to f/u with patient at next schedule HD on 01/08/21.  Tobie Poet, NP

## 2021-02-02 ENCOUNTER — Encounter (HOSPITAL_COMMUNITY): Payer: Medicaid Other

## 2021-02-02 ENCOUNTER — Encounter: Payer: Medicaid Other | Admitting: Vascular Surgery

## 2021-02-03 ENCOUNTER — Ambulatory Visit (INDEPENDENT_AMBULATORY_CARE_PROVIDER_SITE_OTHER): Payer: Medicaid Other | Admitting: Vascular Surgery

## 2021-02-03 ENCOUNTER — Encounter: Payer: Self-pay | Admitting: Vascular Surgery

## 2021-02-03 ENCOUNTER — Other Ambulatory Visit: Payer: Self-pay

## 2021-02-03 ENCOUNTER — Ambulatory Visit (INDEPENDENT_AMBULATORY_CARE_PROVIDER_SITE_OTHER): Payer: Medicaid Other

## 2021-02-03 ENCOUNTER — Other Ambulatory Visit (HOSPITAL_COMMUNITY): Payer: Self-pay | Admitting: Vascular Surgery

## 2021-02-03 VITALS — BP 178/82 | HR 94 | Temp 99.0°F | Resp 16 | Ht 66.0 in | Wt 218.3 lb

## 2021-02-03 DIAGNOSIS — Z48812 Encounter for surgical aftercare following surgery on the circulatory system: Secondary | ICD-10-CM

## 2021-02-03 DIAGNOSIS — N186 End stage renal disease: Secondary | ICD-10-CM

## 2021-02-03 DIAGNOSIS — Z992 Dependence on renal dialysis: Secondary | ICD-10-CM

## 2021-02-03 NOTE — H&P (View-Only) (Signed)
Vascular and Vein Specialist of [place]  Patient name: Kevin Meyer MRN: [id number] DOB: April 15, 1971 Sex: male  REASON FOR VISIT: Follow-up left arm hemodialysis access  HPI: Kevin Meyer is a 50 y.o. male here today for follow-up.  He has complex history of access in his left arm.  He initially had acute hemodialysis via left IJ catheter.  I placed a left radiocephalic fistula which failed to mature.  He subsequently underwent brachiocephalic fistula.  He had excellent maturation of this but then developed steal symptoms.  He presented with tissue loss on the tip of his second and fifth finger with severe pain.  He also had sepsis related to his catheter and this was exchanged.  He underwent  a DRIL procedure to hopefully correct his left hand steal and preserve his fistula with Dr. Stanford Breed on 12/30/2020.  He is here today for follow-up.  He reports resolution of the pain in his left hand.   Current Outpatient Medications  Medication Sig Dispense Refill   acetaminophen (TYLENOL) 500 MG tablet Take 1,000 mg by mouth every 6 (six) hours as needed for moderate pain or headache.     allopurinol (ZYLOPRIM) 100 MG tablet Take 100 mg by mouth daily.     aspirin EC 81 MG tablet Take 81 mg by mouth daily.     atorvastatin (LIPITOR) 40 MG tablet Take 40 mg by mouth at bedtime.     Cholecalciferol (VITAMIN D) 50 MCG (2000 UT) CAPS Take 2,000 Units by mouth daily.     Dulaglutide (TRULICITY) A999333 0000000 SOPN Inject 0.75 mg into the skin every Thursday.     folic acid (FOLVITE) 1 MG tablet Take 1 tablet (1 mg total) by mouth daily. 30 tablet 3   glipiZIDE (GLUCOTROL) 10 MG tablet Take 1 tablet (10 mg total) by mouth 2 (two) times daily before a meal.     midodrine (PROAMATINE) 5 MG tablet Take 1 tablet (5 mg total) by mouth 3 (three) times a week. 90 tablet 3   multivitamin (RENA-VIT) TABS tablet Take 1 tablet by mouth daily.     VELPHORO 500 MG chewable tablet Chew 500 mg  by mouth 3 (three) times daily.     vitamin B-12 1000 MCG tablet Take 1 tablet (1,000 mcg total) by mouth daily. 30 tablet 1   calcium acetate (PHOSLO) 667 MG capsule Take 1,334 mg by mouth 3 (three) times daily with meals. (Patient not taking: No sig reported)     epoetin alfa-epbx (RETACRIT) 3000 UNIT/ML injection 3,000 Units every 14 (fourteen) days. (Patient not taking: No sig reported)     No current facility-administered medications for this visit.     PHYSICAL EXAM: Vitals:   02/03/21 0945  BP: (!) 178/82  Pulse: 94  Resp: 16  Temp: 99 F (37.2 C)  TempSrc: Temporal  SpO2: 96%  Weight: 218 lb 4.1 oz (99 kg)  Height: '5\' 6"'$  (1.676 m)    GENERAL: The patient is a well-nourished male, in no acute distress. The vital signs are documented above. He continues to have ulcerations on his left fifth and second finger.  This appears to be improving from the pictures in his EHR from his inpatient hospitalization.  He has excellent thrill through his fistula and palpable radial pulse.  He does have some Vicryl stitch abscess at the antecubital wound and I debrided this slightly today.  MEDICAL ISSUES: Status post DRIL for steal on 12/30/2020.  I feel that he is now 5 weeks out  and can begin to use his left upper arm brachiocephalic fistula at any time.  I feel that it is safe to begin using 15-gauge needles immediately since his fistula itself is quite mature.  I explained that we can remove his catheter in the minor procedure room at [facility] once we are assured that his fistula is working appropriately.  I do not need to see him in the office but would request the dialysis staff to call my office to coordinate catheter removal.   Rosetta Posner, MD FACS [facility] of [facility] [phone]  Note: Portions of this report may have been transcribed using voice recognition software.  Every effort has been made to ensure accuracy; however, inadvertent  computerized transcription errors may still be present.

## 2021-02-03 NOTE — Progress Notes (Signed)
Vascular and Vein Specialist of [place]  Patient name: Kevin Meyer MRN: [id number] DOB: Dec 30, 1970 Sex: male  REASON FOR VISIT: Follow-up left arm hemodialysis access  HPI: Kevin Meyer is a 50 y.o. male here today for follow-up.  He has complex history of access in his left arm.  He initially had acute hemodialysis via left IJ catheter.  I placed a left radiocephalic fistula which failed to mature.  He subsequently underwent brachiocephalic fistula.  He had excellent maturation of this but then developed steal symptoms.  He presented with tissue loss on the tip of his second and fifth finger with severe pain.  He also had sepsis related to his catheter and this was exchanged.  He underwent  a DRIL procedure to hopefully correct his left hand steal and preserve his fistula with Dr. Stanford Breed on 12/30/2020.  He is here today for follow-up.  He reports resolution of the pain in his left hand.   Current Outpatient Medications  Medication Sig Dispense Refill   acetaminophen (TYLENOL) 500 MG tablet Take 1,000 mg by mouth every 6 (six) hours as needed for moderate pain or headache.     allopurinol (ZYLOPRIM) 100 MG tablet Take 100 mg by mouth daily.     aspirin EC 81 MG tablet Take 81 mg by mouth daily.     atorvastatin (LIPITOR) 40 MG tablet Take 40 mg by mouth at bedtime.     Cholecalciferol (VITAMIN D) 50 MCG (2000 UT) CAPS Take 2,000 Units by mouth daily.     Dulaglutide (TRULICITY) A999333 0000000 SOPN Inject 0.75 mg into the skin every Thursday.     folic acid (FOLVITE) 1 MG tablet Take 1 tablet (1 mg total) by mouth daily. 30 tablet 3   glipiZIDE (GLUCOTROL) 10 MG tablet Take 1 tablet (10 mg total) by mouth 2 (two) times daily before a meal.     midodrine (PROAMATINE) 5 MG tablet Take 1 tablet (5 mg total) by mouth 3 (three) times a week. 90 tablet 3   multivitamin (RENA-VIT) TABS tablet Take 1 tablet by mouth daily.     VELPHORO 500 MG chewable tablet Chew 500 mg  by mouth 3 (three) times daily.     vitamin B-12 1000 MCG tablet Take 1 tablet (1,000 mcg total) by mouth daily. 30 tablet 1   calcium acetate (PHOSLO) 667 MG capsule Take 1,334 mg by mouth 3 (three) times daily with meals. (Patient not taking: No sig reported)     epoetin alfa-epbx (RETACRIT) 3000 UNIT/ML injection 3,000 Units every 14 (fourteen) days. (Patient not taking: No sig reported)     No current facility-administered medications for this visit.     PHYSICAL EXAM: Vitals:   02/03/21 0945  BP: (!) 178/82  Pulse: 94  Resp: 16  Temp: 99 F (37.2 C)  TempSrc: Temporal  SpO2: 96%  Weight: 218 lb 4.1 oz (99 kg)  Height: '5\' 6"'$  (1.676 m)    GENERAL: The patient is a well-nourished male, in no acute distress. The vital signs are documented above. He continues to have ulcerations on his left fifth and second finger.  This appears to be improving from the pictures in his EHR from his inpatient hospitalization.  He has excellent thrill through his fistula and palpable radial pulse.  He does have some Vicryl stitch abscess at the antecubital wound and I debrided this slightly today.  MEDICAL ISSUES: Status post DRIL for steal on 12/30/2020.  I feel that he is now 5 weeks out  and can begin to use his left upper arm brachiocephalic fistula at any time.  I feel that it is safe to begin using 15-gauge needles immediately since his fistula itself is quite mature.  I explained that we can remove his catheter in the minor procedure room at [facility] once we are assured that his fistula is working appropriately.  I do not need to see him in the office but would request the dialysis staff to call my office to coordinate catheter removal.   Rosetta Posner, MD FACS [facility] Specialists of [facility] [phone]  Note: Portions of this report may have been transcribed using voice recognition software.  Every effort has been made to ensure accuracy; however, inadvertent  computerized transcription errors may still be present.

## 2021-02-05 ENCOUNTER — Other Ambulatory Visit: Payer: Self-pay

## 2021-02-05 ENCOUNTER — Telehealth: Payer: Self-pay

## 2021-02-05 NOTE — Telephone Encounter (Signed)
Received a call from [facility] at [facility] requesting to schedule pt for a tunneled dialysis catheter removal. Procedure scheduled at [facility] with Dr. Donnetta Hutching on 02/09/21 and instructions provided to relay to patient. Verbalized understanding.

## 2021-02-09 ENCOUNTER — Other Ambulatory Visit: Payer: Self-pay

## 2021-02-09 ENCOUNTER — Ambulatory Visit (HOSPITAL_COMMUNITY)
Admission: RE | Admit: 2021-02-09 | Discharge: 2021-02-09 | Disposition: A | Discharge status: Home / Self Care | Payer: Medicaid Other | Attending: Vascular Surgery | Admitting: Vascular Surgery

## 2021-02-09 ENCOUNTER — Encounter (HOSPITAL_COMMUNITY): Payer: Self-pay | Admitting: Vascular Surgery

## 2021-02-09 ENCOUNTER — Encounter (HOSPITAL_COMMUNITY)
Admission: RE | Disposition: A | Discharge status: Home / Self Care | Payer: Self-pay | Source: Home / Self Care | Attending: Vascular Surgery

## 2021-02-09 DIAGNOSIS — Z4901 Encounter for fitting and adjustment of extracorporeal dialysis catheter: Secondary | ICD-10-CM | POA: Diagnosis not present

## 2021-02-09 DIAGNOSIS — Z992 Dependence on renal dialysis: Secondary | ICD-10-CM | POA: Diagnosis not present

## 2021-02-09 DIAGNOSIS — Z7985 Long-term (current) use of injectable non-insulin antidiabetic drugs: Secondary | ICD-10-CM | POA: Diagnosis not present

## 2021-02-09 DIAGNOSIS — Z7984 Long term (current) use of oral hypoglycemic drugs: Secondary | ICD-10-CM | POA: Insufficient documentation

## 2021-02-09 DIAGNOSIS — Z79899 Other long term (current) drug therapy: Secondary | ICD-10-CM | POA: Insufficient documentation

## 2021-02-09 DIAGNOSIS — N186 End stage renal disease: Secondary | ICD-10-CM | POA: Diagnosis present

## 2021-02-09 DIAGNOSIS — Z7982 Long term (current) use of aspirin: Secondary | ICD-10-CM | POA: Insufficient documentation

## 2021-02-09 HISTORY — PX: REMOVAL OF A DIALYSIS CATHETER: SHX6053

## 2021-02-09 LAB — GLUCOSE, CAPILLARY: Glucose-Capillary: 196 mg/dL — ABNORMAL HIGH (ref 70–99)

## 2021-02-09 SURGERY — REMOVAL, DIALYSIS CATHETER
Anesthesia: LOCAL | Site: Chest

## 2021-02-09 MED ORDER — LIDOCAINE HCL (PF) 1 % IJ SOLN
INTRAMUSCULAR | Status: AC
  Filled 2021-02-09: qty 30

## 2021-02-09 MED ORDER — LIDOCAINE HCL (PF) 1 % IJ SOLN: 5.0000 mL | INTRAMUSCULAR | Status: DC | PRN

## 2021-02-09 MED ORDER — LIDOCAINE-PRILOCAINE 2.5-2.5 % EX CREA: 1.0000 "application " | TOPICAL_CREAM | CUTANEOUS | Status: DC | PRN

## 2021-02-09 MED ORDER — LIDOCAINE HCL (PF) 1 % IJ SOLN
INTRAMUSCULAR | Status: DC | PRN
  Administered 2021-02-09: 3 mL

## 2021-02-09 MED ORDER — ALTEPLASE 2 MG IJ SOLR: 2.0000 mg | Freq: Once | INTRAMUSCULAR | Status: DC | PRN

## 2021-02-09 MED ORDER — SODIUM CHLORIDE 0.9 % IV SOLN: 100.0000 mL | INTRAVENOUS | Status: DC | PRN

## 2021-02-09 MED ORDER — HEPARIN SODIUM (PORCINE) 1000 UNIT/ML DIALYSIS: 1000.0000 [IU] | INTRAMUSCULAR | Status: DC | PRN

## 2021-02-09 MED ORDER — PENTAFLUOROPROP-TETRAFLUOROETH EX AERO: 1.0000 "application " | INHALATION_SPRAY | CUTANEOUS | Status: DC | PRN

## 2021-02-09 SURGICAL SUPPLY — 19 items
APPLICATOR CHLORAPREP 10.5 ORG (MISCELLANEOUS) ×2 IMPLANT
CLOTH BEACON ORANGE TIMEOUT ST (SAFETY) ×2 IMPLANT
DECANTER SPIKE VIAL GLASS SM (MISCELLANEOUS) ×2 IMPLANT
DERMABOND ADVANCED (GAUZE/BANDAGES/DRESSINGS)
DERMABOND ADVANCED .7 DNX12 (GAUZE/BANDAGES/DRESSINGS) IMPLANT
DRAPE HALF SHEET 40X57 (DRAPES) ×2 IMPLANT
ELECT REM PT RETURN 9FT ADLT (ELECTROSURGICAL) ×2
ELECTRODE REM PT RTRN 9FT ADLT (ELECTROSURGICAL) ×1 IMPLANT
GLOVE SURG POLYISO LF SZ7.5 (GLOVE) IMPLANT
GLOVE SURG UNDER POLY LF SZ7 (GLOVE) ×2 IMPLANT
GOWN STRL REUS W/TWL LRG LVL3 (GOWN DISPOSABLE) IMPLANT
NEEDLE HYPO 25X1 1.5 SAFETY (NEEDLE) ×2 IMPLANT
PENCIL SMOKE EVACUATOR COATED (MISCELLANEOUS) IMPLANT
SPONGE GAUZE 2X2 8PLY STRL LF (GAUZE/BANDAGES/DRESSINGS) ×2 IMPLANT
SUT MNCRL AB 4-0 PS2 18 (SUTURE) ×2 IMPLANT
SUT VIC AB 3-0 SH 27 (SUTURE) ×1
SUT VIC AB 3-0 SH 27X BRD (SUTURE) ×1 IMPLANT
SYR CONTROL 10ML LL (SYRINGE) ×2 IMPLANT
TOWEL OR 17X26 4PK STRL BLUE (TOWEL DISPOSABLE) ×2 IMPLANT

## 2021-02-09 NOTE — Discharge Instructions (Signed)
RESUME HOME MEDICATIONS AS USUAL .

## 2021-02-09 NOTE — Interval H&P Note (Signed)
History and Physical Interval Note:  02/09/2021 9:54 AM  Kevin Meyer  has presented today for surgery, with the diagnosis of ESRD.  The various methods of treatment have been discussed with the patient and family. After consideration of risks, benefits and other options for treatment, the patient has consented to  Procedure(s) with comments: MINOR REMOVAL OF TUNNELED DIALYSIS CATHETER (N/A) - pt to arrive 1 hour prior & be NPO as a surgical intervention.  The patient's history has been reviewed, patient examined, no change in status, stable for surgery.  I have reviewed the patient's chart and labs.  Questions were answered to the patient's satisfaction.     Curt Jews

## 2021-02-10 ENCOUNTER — Encounter (HOSPITAL_COMMUNITY): Payer: Self-pay | Admitting: Vascular Surgery

## 2021-02-17 NOTE — Op Note (Signed)
    OPERATIVE REPORT  DATE OF SURGERY: 02/17/2021  PATIENT: Kevin Meyer, 50 y.o. male MRN: [id number]  DOB: 1970-11-02  PRE-OPERATIVE DIAGNOSIS: End-stage renal disease  POST-OPERATIVE DIAGNOSIS:  Same  PROCEDURE: Removal of right IJ tunneled hemodialysis catheter  SURGEON:  Curt Jews, M.D.  PHYSICIAN ASSISTANT: Nurse  The assistant was needed for exposure and to expedite the case  ANESTHESIA: 1% lidocaine local  EBL: per anesthesia record  No intake/output data recorded.  BLOOD ADMINISTERED: none  DRAINS: none  SPECIMEN: none  COUNTS CORRECT:  YES  PATIENT DISPOSITION:  PACU - hemodynamically stable  PROCEDURE DETAILS: The patient was taken to the minor procedure room.  The area of the right neck and chest and catheter were prepped and draped in the usual sterile fashion.  Lidocaine local was instilled around the catheter exit site around the felt cuff.  Hemostats were used to free the felt cuff from the surrounding tissue and the catheter was removed in its entirety.  Pressure was held for hemostasis.  A sterile dressing was applied and the patient was discharged to home   Kevin Meyer, M.D., [facility] 02/17/2021 12:13 PM  Note: Portions of this report may have been transcribed using voice recognition software.  Every effort has been made to ensure accuracy; however, inadvertent computerized transcription errors may still be present.

## 2021-03-24 ENCOUNTER — Other Ambulatory Visit: Payer: Self-pay

## 2021-03-24 ENCOUNTER — Ambulatory Visit (INDEPENDENT_AMBULATORY_CARE_PROVIDER_SITE_OTHER): Payer: Medicaid Other | Admitting: Vascular Surgery

## 2021-03-24 ENCOUNTER — Encounter: Payer: Self-pay | Admitting: Vascular Surgery

## 2021-03-24 VITALS — BP 155/75 | HR 81 | Temp 99.1°F | Wt 222.8 lb

## 2021-03-24 DIAGNOSIS — Z992 Dependence on renal dialysis: Secondary | ICD-10-CM

## 2021-03-24 DIAGNOSIS — N186 End stage renal disease: Secondary | ICD-10-CM

## 2021-03-24 NOTE — Progress Notes (Signed)
   Vascular and Vein Specialist of [place]  Patient name: Kevin Meyer MRN: [id number] DOB: 1970/09/20 Sex: male  REASON FOR VISIT: Follow-up left arm AV access  HPI: Kevin Meyer is a 50 y.o. male here today for follow-up.  He is having successful use of his left upper arm AV fistula.  He does report 1 episode of difficult access with specific tach and had an infiltration and had to return the following day.  He does not have any evidence of skin breakdown over his fistula and has an excellent thrill.  His hand is warm.  I do not palpate a radial pulse.  Current Outpatient Medications  Medication Sig Dispense Refill   allopurinol (ZYLOPRIM) 100 MG tablet Take 100 mg by mouth daily.     aspirin EC 81 MG tablet Take 81 mg by mouth daily.     atorvastatin (LIPITOR) 40 MG tablet Take 40 mg by mouth at bedtime.     Cholecalciferol (VITAMIN D) 50 MCG (2000 UT) CAPS Take 2,000 Units by mouth daily.     Dulaglutide (TRULICITY) 1.09 NA/3.5TD SOPN Inject 0.5 mg into the skin every Tuesday.     epoetin alfa-epbx (RETACRIT) 3000 UNIT/ML injection 3,000 Units every 14 (fourteen) days.     folic acid (FOLVITE) 1 MG tablet Take 1 tablet (1 mg total) by mouth daily. 30 tablet 3   glipiZIDE (GLUCOTROL) 10 MG tablet Take 1 tablet (10 mg total) by mouth 2 (two) times daily before a meal.     midodrine (PROAMATINE) 5 MG tablet Take 1 tablet (5 mg total) by mouth 3 (three) times a week. 90 tablet 3   multivitamin (RENA-VIT) TABS tablet Take 1 tablet by mouth daily.     VELPHORO 500 MG chewable tablet Chew 1,000 mg by mouth 3 (three) times daily with meals.     vitamin B-12 1000 MCG tablet Take 1 tablet (1,000 mcg total) by mouth daily. 30 tablet 1   No current facility-administered medications for this visit.     PHYSICAL EXAM: Vitals:   03/24/21 1017  BP: (!) 155/75  Pulse: 81  Temp: 99.1 F (37.3 C)  TempSrc: Skin  SpO2: 98%  Weight: 222 lb 12.8 oz (101.1 kg)     GENERAL: The patient is a well-nourished male, in no acute distress. The vital signs are documented above. His hand is warm.  No radial pulse palpable.  Excellent thrill.  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  No surrounding erythema  MEDICAL ISSUES: No further steal symptoms.  He is having slow resolution of his dry gangrenous changes.  We will continue to allow this to demarcate.  I will see him again in 2 months and he will notify should he develop any evidence of infection.   Rosetta Posner, MD FACS [facility]in Special[facility] of [facility] [phone]  Note: Portions of this report may have been transcribed using voice recognition software.  Every effort has been made to ensure accuracy; however, inadvertent computerized transcription errors may still be present.

## 2021-05-26 ENCOUNTER — Other Ambulatory Visit: Payer: Self-pay

## 2021-05-26 ENCOUNTER — Encounter: Payer: Self-pay | Admitting: Vascular Surgery

## 2021-05-26 ENCOUNTER — Ambulatory Visit: Payer: Medicaid Other | Admitting: Vascular Surgery

## 2021-05-26 VITALS — BP 153/73 | HR 88 | Temp 98.8°F | Resp 16 | Ht 66.0 in | Wt 219.0 lb

## 2021-05-26 DIAGNOSIS — Z992 Dependence on renal dialysis: Secondary | ICD-10-CM | POA: Diagnosis not present

## 2021-05-26 DIAGNOSIS — N186 End stage renal disease: Secondary | ICD-10-CM | POA: Diagnosis not present

## 2021-05-26 NOTE — Progress Notes (Signed)
Vascular and Vein Specialist of [place]  Patient name: Kevin Meyer MRN: [id number] DOB: 01/18/1971 Sex: male  REASON FOR VISIT: Follow-up left arm AV access  HPI: Kevin Meyer is a 51 y.o. male here today for follow-up.  He has had very good use of his left upper arm AV fistula.  He had complication of steal syndrome and underwent aDRIL procedure with Dr. Luan Pulling on 12/30/2020.  He has had no more difficulty regarding pain or numbness or weakness in his left hand.  He had developed gangrenous changes on the tip of his left index finger and on the lateral aspect of his left fifth finger.  These have been demarcating.  Past Medical History:  Diagnosis Date   Anemia    Diabetes mellitus without complication (HCC)    Gout    High cholesterol    Hypertension    Osteoarthritis    Renal disorder     Family History  Problem Relation Age of Onset   Stroke Mother    Diabetes Other    Hypertension Father     SOCIAL HISTORY: Social History   Tobacco Use   Smoking status: Never   Smokeless tobacco: Never  Substance Use Topics   Alcohol use: Never    Comment: occasional    Allergies  Allergen Reactions   Gabapentin Anaphylaxis   Lyrica [Pregabalin] Anaphylaxis    Current Outpatient Medications  Medication Sig Dispense Refill   allopurinol (ZYLOPRIM) 100 MG tablet Take 100 mg by mouth daily.     aspirin EC 81 MG tablet Take 81 mg by mouth daily.     atorvastatin (LIPITOR) 40 MG tablet Take 40 mg by mouth at bedtime.     Cholecalciferol (VITAMIN D) 50 MCG (2000 UT) CAPS Take 2,000 Units by mouth daily.     Dulaglutide (TRULICITY) 2.83 TD/1.7OH SOPN Inject 0.5 mg into the skin every Tuesday.     epoetin alfa-epbx (RETACRIT) 3000 UNIT/ML injection 3,000 Units every 14 (fourteen) days.     folic acid (FOLVITE) 1 MG tablet Take 1 tablet (1 mg total) by mouth daily. 30 tablet 3   glipiZIDE (GLUCOTROL) 10 MG tablet Take 1 tablet (10 mg total) by  mouth 2 (two) times daily before a meal.     midodrine (PROAMATINE) 5 MG tablet Take 1 tablet (5 mg total) by mouth 3 (three) times a week. 90 tablet 3   multivitamin (RENA-VIT) TABS tablet Take 1 tablet by mouth daily.     VELPHORO 500 MG chewable tablet Chew 1,000 mg by mouth 3 (three) times daily with meals.     vitamin B-12 1000 MCG tablet Take 1 tablet (1,000 mcg total) by mouth daily. 30 tablet 1   No current facility-administered medications for this visit.    REVIEW OF SYSTEMS:  [X]  denotes positive finding, [ ]  denotes negative finding Cardiac  Comments:  Chest pain or chest pressure:    Shortness of breath upon exertion:    Short of breath when lying flat:    Irregular heart rhythm:        Vascular    Pain in calf, thigh, or hip brought on by ambulation:    Pain in feet at night that wakes you up from your sleep:     Blood clot in your veins:    Leg swelling:           PHYSICAL EXAM: Vitals:   05/26/21 0816  BP: (!) 153/73  Pulse: 88  Resp: 16  Temp:  98.8 F (37.1 C)  SpO2: 96%  Weight: 219 lb (99.3 kg)  Height: 5\' 6"  (1.676 m)    GENERAL: The patient is a well-nourished male, in no acute distress. The vital signs are documented above. CARDIOVASCULAR: Thrill in his upper arm fistula. PULMONARY: There is good air exchange  MUSCULOSKELETAL: There are no major deformities or cyanosis. NEUROLOGIC: No focal weakness or paresthesias are detected. SKIN: He does have progressive dry gangrenous changes on the tip of his left second finger with some surrounding erythema above the dry gangrene and some purulence at the base of this.  He has a 1 cm eschar over the lateral aspect of his fifth finger with no surrounding erythema PSYCHIATRIC: The patient has a normal affect.  DATA:  None  MEDICAL ISSUES: Patient is having good use of his left upper arm AV fistula.  No further steal symptoms.  Has had progression on the gangrenous changes on the tip of his left second  finger.  I will refer him to hand surgery for evaluation and consideration of surgical treatment of his index and possibly fifth finger as well.  Discussed this at length with the patient who understands    Rosetta Posner, MD [facility] [phone]  Note: Portions of this report may have been transcribed using voice recognition software.  Every effort has been made to ensure accuracy; however, inadvertent computerized transcription errors may still be present.

## 2021-06-07 ENCOUNTER — Encounter (HOSPITAL_COMMUNITY): Payer: Self-pay | Admitting: Orthopedic Surgery

## 2021-06-07 ENCOUNTER — Other Ambulatory Visit: Payer: Self-pay

## 2021-06-07 NOTE — Progress Notes (Signed)
Interview done with the mom Enid Derry due to the being in a dialysis session.  PCP - Denies  Cardiologist - Denies  EP- Denies  Endocrine- Denies  Pulm- Denies  Chest x-ray - 01/04/21 (E)  EKG - 12/24/20 (E)  Stress Test - Denies  ECHO - 06/23/20 (E)  Cardiac Cath - Denies  AICD- na PM- na LOOP- na  Nerve Stimulator- Denies  Dialysis- M-W-F, pt currently in a session  Sleep Study - Denies CPAP - Denies  LABS- 06/08/21: CBC, BMP  ASA- LD- 2/13  ERAS- Yes- clears until [place]- 12/24/20(E): 7.2 Fasting Blood Sugar - unknown to mom Checks Blood Sugar _unknown____ times a day  Anesthesia- No  Enid Derry denies the pt having chest pain, sob, or fever during the pre-op phone call. All instructions explained to [place], with a verbal understanding of the material including: as of today, stop taking all Aspirin (unless instructed by your doctor) and Other Aspirin containing products, Vitamins, Fish oils, and Herbal medications. Also stop all NSAIDS i.e. Advil, Ibuprofen, Motrin, Aleve, Anaprox, Naproxen, BC, Goody Powders, and all Supplements.     WHAT DO I DO ABOUT MY DIABETES MEDICATION?  Do not take Glipizide the morning of surgery.  The day of surgery, do not take other diabetes injectable Trulicity (dulaglutide).  If your CBG is greater than 220 mg/dL, call the number below for further instructions.  How do I manage my blood sugar before surgery? Check your blood sugar at least 4 times a day, starting 2 days before surgery, to make sure that the level is not too high or low. Check your blood sugar the morning of your surgery when you wake up and every 2 hours until you get to the Short Stay unit. If your blood sugar is less than 70 mg/dL, you will need to treat for low blood sugar: Do not take insulin. Treat a low blood sugar (less than 70 mg/dL) with  cup of clear juice (cranberry or apple), 4 glucose tablets, OR glucose gel. Recheck blood sugar in 15 minutes after  treatment (to make sure it is greater than 70 mg/dL). If your blood sugar is not greater than 70 mg/dL on recheck, call [phone]  for further instructions. Report your blood sugar to the short stay nurse when you get to Short Stay.  Reviewed and Endorsed by [facility] Patient Education Committee, August 2015  Enid Derry also instructed for the pt to wear a mask and social distance if he goes out. The opportunity to ask questions was provided.   Coronavirus Screening  Have you experienced the following symptoms:  Cough yes/no: No Fever (>100.19F)  yes/no: No Runny nose yes/no: No Sore throat yes/no: No Difficulty breathing/shortness of breath  yes/no: No  Have you or a family member traveled in the last 14 days and where? yes/no: No   If the patient indicates "YES" to the above questions, their PAT will be rescheduled to limit the exposure to others and, the surgeon will be notified. THE PATIENT WILL NEED TO BE ASYMPTOMATIC FOR 14 DAYS.   If the patient is not experiencing any of these symptoms, the PAT nurse will instruct them to NOT bring anyone with them to their appointment since they may have these symptoms or traveled as well.   Please remind your patients and families that hospital visitation restrictions are in effect and the importance of the restrictions.

## 2021-06-08 ENCOUNTER — Encounter (HOSPITAL_COMMUNITY): Payer: Self-pay | Admitting: Orthopedic Surgery

## 2021-06-08 ENCOUNTER — Ambulatory Visit (HOSPITAL_COMMUNITY)
Admission: RE | Admit: 2021-06-08 | Discharge: 2021-06-08 | Disposition: A | Discharge status: Home / Self Care | Payer: Medicaid Other | Attending: Orthopedic Surgery | Admitting: Orthopedic Surgery

## 2021-06-08 ENCOUNTER — Ambulatory Visit (HOSPITAL_COMMUNITY): Payer: Medicaid Other | Admitting: Anesthesiology

## 2021-06-08 ENCOUNTER — Encounter (HOSPITAL_COMMUNITY)
Admission: RE | Disposition: A | Discharge status: Home / Self Care | Payer: Self-pay | Source: Home / Self Care | Attending: Orthopedic Surgery

## 2021-06-08 ENCOUNTER — Ambulatory Visit (HOSPITAL_BASED_OUTPATIENT_CLINIC_OR_DEPARTMENT_OTHER): Payer: Medicaid Other | Admitting: Anesthesiology

## 2021-06-08 DIAGNOSIS — E669 Obesity, unspecified: Secondary | ICD-10-CM | POA: Diagnosis not present

## 2021-06-08 DIAGNOSIS — I509 Heart failure, unspecified: Secondary | ICD-10-CM | POA: Diagnosis not present

## 2021-06-08 DIAGNOSIS — N186 End stage renal disease: Secondary | ICD-10-CM | POA: Insufficient documentation

## 2021-06-08 DIAGNOSIS — I70228 Atherosclerosis of native arteries of extremities with rest pain, other extremity: Secondary | ICD-10-CM | POA: Diagnosis not present

## 2021-06-08 DIAGNOSIS — Z6834 Body mass index (BMI) 34.0-34.9, adult: Secondary | ICD-10-CM | POA: Diagnosis not present

## 2021-06-08 DIAGNOSIS — I120 Hypertensive chronic kidney disease with stage 5 chronic kidney disease or end stage renal disease: Secondary | ICD-10-CM | POA: Diagnosis not present

## 2021-06-08 DIAGNOSIS — E1122 Type 2 diabetes mellitus with diabetic chronic kidney disease: Secondary | ICD-10-CM

## 2021-06-08 DIAGNOSIS — E785 Hyperlipidemia, unspecified: Secondary | ICD-10-CM | POA: Insufficient documentation

## 2021-06-08 DIAGNOSIS — I132 Hypertensive heart and chronic kidney disease with heart failure and with stage 5 chronic kidney disease, or end stage renal disease: Secondary | ICD-10-CM | POA: Diagnosis not present

## 2021-06-08 DIAGNOSIS — Z794 Long term (current) use of insulin: Secondary | ICD-10-CM | POA: Insufficient documentation

## 2021-06-08 DIAGNOSIS — I998 Other disorder of circulatory system: Secondary | ICD-10-CM

## 2021-06-08 HISTORY — PX: AMPUTATION: SHX166

## 2021-06-08 LAB — POCT I-STAT, CHEM 8
BUN: 31 mg/dL — ABNORMAL HIGH (ref 6–20)
Calcium, Ion: 1.05 mmol/L — ABNORMAL LOW (ref 1.15–1.40)
Chloride: 99 mmol/L (ref 98–111)
Creatinine, Ser: 8.1 mg/dL — ABNORMAL HIGH (ref 0.61–1.24)
Glucose, Bld: 159 mg/dL — ABNORMAL HIGH (ref 70–99)
HCT: 36 % — ABNORMAL LOW (ref 39.0–52.0)
Hemoglobin: 12.2 g/dL — ABNORMAL LOW (ref 13.0–17.0)
Potassium: 4.3 mmol/L (ref 3.5–5.1)
Sodium: 138 mmol/L (ref 135–145)
TCO2: 30 mmol/L (ref 22–32)

## 2021-06-08 LAB — GLUCOSE, CAPILLARY
Glucose-Capillary: 152 mg/dL — ABNORMAL HIGH (ref 70–99)
Glucose-Capillary: 135 mg/dL — ABNORMAL HIGH (ref 70–99)

## 2021-06-08 SURGERY — AMPUTATION DIGIT
Anesthesia: Monitor Anesthesia Care | Site: Hand | Laterality: Left

## 2021-06-08 MED ORDER — ORAL CARE MOUTH RINSE: 15.0000 mL | Freq: Once | OROMUCOSAL | Status: AC

## 2021-06-08 MED ORDER — 0.9 % SODIUM CHLORIDE (POUR BTL) OPTIME
TOPICAL | Status: DC | PRN
  Administered 2021-06-08: 1000 mL

## 2021-06-08 MED ORDER — CEFAZOLIN SODIUM-DEXTROSE 2-4 GM/100ML-% IV SOLN: 2.0000 g | INTRAVENOUS | Status: DC

## 2021-06-08 MED ORDER — PROPOFOL 10 MG/ML IV BOLUS
INTRAVENOUS | Status: DC | PRN
  Administered 2021-06-08: 50 mg via INTRAVENOUS

## 2021-06-08 MED ORDER — ROPIVACAINE HCL 5 MG/ML IJ SOLN
INTRAMUSCULAR | Status: DC | PRN
Start: 2021-06-08 — End: 2021-06-08
  Administered 2021-06-08: 30 mL via PERINEURAL

## 2021-06-08 MED ORDER — FENTANYL CITRATE (PF) 100 MCG/2ML IJ SOLN: 25.0000 ug | INTRAMUSCULAR | Status: DC | PRN

## 2021-06-08 MED ORDER — BUPIVACAINE HCL (PF) 0.25 % IJ SOLN
INTRAMUSCULAR | Status: AC
  Filled 2021-06-08: qty 60

## 2021-06-08 MED ORDER — OXYCODONE HCL 5 MG PO TABS: 5.0000 mg | ORAL_TABLET | Freq: Once | ORAL | Status: DC | PRN

## 2021-06-08 MED ORDER — CHLORHEXIDINE GLUCONATE 0.12 % MT SOLN
OROMUCOSAL | Status: AC
  Administered 2021-06-08: 15 mL via OROMUCOSAL
  Filled 2021-06-08: qty 15

## 2021-06-08 MED ORDER — LACTATED RINGERS IV SOLN: INTRAVENOUS | Status: DC

## 2021-06-08 MED ORDER — FENTANYL CITRATE (PF) 100 MCG/2ML IJ SOLN: 50.0000 ug | Freq: Once | INTRAMUSCULAR | Status: AC

## 2021-06-08 MED ORDER — MIDAZOLAM HCL 2 MG/2ML IJ SOLN: 1.0000 mg | Freq: Once | INTRAMUSCULAR | Status: AC

## 2021-06-08 MED ORDER — EPHEDRINE SULFATE-NACL 50-0.9 MG/10ML-% IV SOSY
PREFILLED_SYRINGE | INTRAVENOUS | Status: DC | PRN
  Administered 2021-06-08 (×2): 15 mg via INTRAVENOUS
  Administered 2021-06-08 (×2): 10 mg via INTRAVENOUS

## 2021-06-08 MED ORDER — SODIUM CHLORIDE 0.9 % IV SOLN: INTRAVENOUS | Status: DC

## 2021-06-08 MED ORDER — INSULIN ASPART 100 UNIT/ML IJ SOLN: 0.0000 [IU] | INTRAMUSCULAR | Status: DC | PRN

## 2021-06-08 MED ORDER — CEFAZOLIN SODIUM-DEXTROSE 2-4 GM/100ML-% IV SOLN
INTRAVENOUS | Status: AC
  Filled 2021-06-08: qty 100

## 2021-06-08 MED ORDER — MIDAZOLAM HCL 2 MG/2ML IJ SOLN
INTRAMUSCULAR | Status: AC
  Administered 2021-06-08: 1 mg via INTRAVENOUS
  Filled 2021-06-08: qty 2

## 2021-06-08 MED ORDER — OXYCODONE HCL 5 MG/5ML PO SOLN: 5.0000 mg | Freq: Once | ORAL | Status: DC | PRN

## 2021-06-08 MED ORDER — FENTANYL CITRATE (PF) 100 MCG/2ML IJ SOLN
INTRAMUSCULAR | Status: AC
  Administered 2021-06-08: 50 ug via INTRAVENOUS
  Filled 2021-06-08: qty 2

## 2021-06-08 MED ORDER — LIDOCAINE HCL (CARDIAC) PF 100 MG/5ML IV SOSY
PREFILLED_SYRINGE | INTRAVENOUS | Status: DC | PRN
  Administered 2021-06-08: 60 mg via INTRATRACHEAL

## 2021-06-08 MED ORDER — OXYCODONE HCL 5 MG PO TABS: 5.0000 mg | ORAL_TABLET | ORAL | 0 refills | Status: AC | PRN

## 2021-06-08 MED ORDER — CEPHALEXIN 500 MG PO CAPS: 500.0000 mg | ORAL_CAPSULE | Freq: Three times a day (TID) | ORAL | 0 refills | Status: AC

## 2021-06-08 MED ORDER — PROPOFOL 500 MG/50ML IV EMUL
INTRAVENOUS | Status: DC | PRN
  Administered 2021-06-08: 100 ug/kg/min via INTRAVENOUS

## 2021-06-08 MED ORDER — ONDANSETRON HCL 4 MG/2ML IJ SOLN: 4.0000 mg | Freq: Once | INTRAMUSCULAR | Status: DC | PRN

## 2021-06-08 MED ORDER — CHLORHEXIDINE GLUCONATE 0.12 % MT SOLN: 15.0000 mL | Freq: Once | OROMUCOSAL | Status: AC

## 2021-06-08 SURGICAL SUPPLY — 43 items
BAG COUNTER SPONGE SURGICOUNT (BAG) ×2 IMPLANT
BNDG COHESIVE 1X5 TAN STRL LF (GAUZE/BANDAGES/DRESSINGS) ×1 IMPLANT
BNDG CONFORM 2 STRL LF (GAUZE/BANDAGES/DRESSINGS) ×2 IMPLANT
BNDG ELASTIC 2X5.8 VLCR STR LF (GAUZE/BANDAGES/DRESSINGS) ×1 IMPLANT
CORD BIPOLAR FORCEPS 12FT (ELECTRODE) ×2 IMPLANT
COVER SURGICAL LIGHT HANDLE (MISCELLANEOUS) ×2 IMPLANT
CUFF TOURN SGL QUICK 18X4 (TOURNIQUET CUFF) ×1 IMPLANT
CUFF TOURN SGL QUICK 24 (TOURNIQUET CUFF)
CUFF TRNQT CYL 24X4X16.5-23 (TOURNIQUET CUFF) IMPLANT
DRAIN PENROSE 0.75X12 (DRAIN) ×1 IMPLANT
DRAPE SURG 17X23 STRL (DRAPES) ×2 IMPLANT
DRSG EMULSION OIL 3X3 NADH (GAUZE/BANDAGES/DRESSINGS) ×1 IMPLANT
DRSG MEPITEL 4X7.2 (GAUZE/BANDAGES/DRESSINGS) ×1 IMPLANT
GAUZE SPONGE 2X2 8PLY STRL LF (GAUZE/BANDAGES/DRESSINGS) IMPLANT
GAUZE SPONGE 4X4 12PLY STRL (GAUZE/BANDAGES/DRESSINGS) ×1 IMPLANT
GAUZE XEROFORM 1X8 LF (GAUZE/BANDAGES/DRESSINGS) ×1 IMPLANT
GLOVE SURG ENC TEXT LTX SZ8 (GLOVE) ×2 IMPLANT
GLOVE SURG MICRO LTX SZ8 (GLOVE) ×2 IMPLANT
GOWN STRL REUS W/ TWL LRG LVL3 (GOWN DISPOSABLE) ×1 IMPLANT
GOWN STRL REUS W/ TWL XL LVL3 (GOWN DISPOSABLE) ×2 IMPLANT
GOWN STRL REUS W/TWL LRG LVL3 (GOWN DISPOSABLE) ×1
GOWN STRL REUS W/TWL XL LVL3 (GOWN DISPOSABLE) ×1
KIT BASIN OR (CUSTOM PROCEDURE TRAY) ×2 IMPLANT
KIT TURNOVER KIT B (KITS) ×2 IMPLANT
MANIFOLD NEPTUNE II (INSTRUMENTS) ×2 IMPLANT
NDL HYPO 25GX1X1/2 BEV (NEEDLE) IMPLANT
NEEDLE HYPO 25GX1X1/2 BEV (NEEDLE) IMPLANT
NS IRRIG 1000ML POUR BTL (IV SOLUTION) ×2 IMPLANT
PACK ORTHO EXTREMITY (CUSTOM PROCEDURE TRAY) ×2 IMPLANT
PAD ARMBOARD 7.5X6 YLW CONV (MISCELLANEOUS) ×3 IMPLANT
SLING ARM FOAM STRAP LRG (SOFTGOODS) ×1 IMPLANT
SOL PREP POV-IOD 4OZ 10% (MISCELLANEOUS) ×4 IMPLANT
SPECIMEN JAR SMALL (MISCELLANEOUS) ×2 IMPLANT
SPONGE GAUZE 2X2 STER 10/PKG (GAUZE/BANDAGES/DRESSINGS)
SUT CHROMIC 5 0 P 3 (SUTURE) ×1 IMPLANT
SUT MERSILENE 4 0 P 3 (SUTURE) IMPLANT
SUT PROLENE 4 0 PS 2 18 (SUTURE) ×1 IMPLANT
SYR CONTROL 10ML LL (SYRINGE) IMPLANT
TOWEL GREEN STERILE (TOWEL DISPOSABLE) ×2 IMPLANT
TOWEL GREEN STERILE FF (TOWEL DISPOSABLE) ×2 IMPLANT
TUBE CONNECTING 12X1/4 (SUCTIONS) IMPLANT
UNDERPAD 30X36 HEAVY ABSORB (UNDERPADS AND DIAPERS) ×2 IMPLANT
WATER STERILE IRR 1000ML POUR (IV SOLUTION) ×1 IMPLANT

## 2021-06-08 NOTE — Anesthesia Preprocedure Evaluation (Addendum)
Anesthesia Evaluation  Patient identified by MRN, date of birth, ID band Patient awake    Reviewed: Allergy & Precautions, NPO status , Patient's Chart, lab work & pertinent test results, reviewed documented beta blocker date and time   History of Anesthesia Complications (+) AWARENESS UNDER ANESTHESIA and history of anesthetic complications  Airway Mallampati: III  TM Distance: >3 FB Neck ROM: Full    Dental  (+) Missing, Dental Advisory Given   Pulmonary neg pulmonary ROS,    Pulmonary exam normal breath sounds clear to auscultation       Cardiovascular hypertension, +CHF  Normal cardiovascular exam Rhythm:Regular Rate:Normal  Echo 06/23/20 1. Left ventricular ejection fraction, by estimation, is 50%. The left ventricle has low normal function. The left ventricle has no regional wall motion abnormalities. There is mild left ventricular hypertrophy. Left ventricular diastolic parameters are indeterminate.  2. Right ventricular systolic function is normal. The right ventricular size is normal.  3. Left atrial size was mildly dilated.  4. A small pericardial effusion is present. The pericardial effusion is circumferential.  5. The mitral valve is normal in structure. No evidence of mitral valve regurgitation. No evidence of mitral stenosis.  6. The aortic valve is tricuspid. There is mild calcification of the aortic valve. There is mild thickening of the aortic valve. Aortic valve regurgitation is not visualized. No aortic stenosis is present.  7. The inferior vena cava is normal in size with greater than 50% respiratory variability, suggesting right atrial pressure of 3 mmHg.   EKG 12/29/20 NSR, LAE   Neuro/Psych negative neurological ROS  negative psych ROS   GI/Hepatic negative GI ROS, Neg liver ROS,   Endo/Other  diabetes, Well Controlled, Type 2, Oral Hypoglycemic Agents, Insulin DependentHyperlipidemia Gout   Renal/GU ESRF and DialysisRenal disease  negative genitourinary   Musculoskeletal  (+) Arthritis , Ischemia left index and small fingers   Abdominal (+) + obese,   Peds  Hematology  (+) Blood dyscrasia, anemia ,   Anesthesia Other Findings   Reproductive/Obstetrics                            Anesthesia Physical Anesthesia Plan  ASA: 4  Anesthesia Plan: Regional and MAC   Post-op Pain Management:    Induction: Intravenous  PONV Risk Score and Plan: 2 and Treatment may vary due to age or medical condition and Propofol infusion  Airway Management Planned: Natural Airway and Simple Face Mask  Additional Equipment:   Intra-op Plan:   Post-operative Plan:   Informed Consent: I have reviewed the patients History and Physical, chart, labs and discussed the procedure including the risks, benefits and alternatives for the proposed anesthesia with the patient or authorized representative who has indicated his/her understanding and acceptance.     Dental advisory given  Plan Discussed with: CRNA and Anesthesiologist  Anesthesia Plan Comments:         Anesthesia Quick Evaluation

## 2021-06-08 NOTE — Transfer of Care (Signed)
Immediate Anesthesia Transfer of Care Note  Patient: Kevin Meyer  Procedure(s) Performed: Left index finger amputation with bilateral neurectomies.  Left small finger debridement. (Left: Hand)  Patient Location: PACU  Anesthesia Type:MAC combined with regional for post-op pain  Level of Consciousness: awake, alert  and oriented  Airway & Oxygen Therapy: Patient Spontanous Breathing  Post-op Assessment: Report given to RN and Post -op Vital signs reviewed and stable  Post vital signs: Reviewed and stable  Last Vitals:  Vitals Value Taken Time  BP 113/69 06/08/21 1654  Temp    Pulse 92 06/08/21 1654  Resp 18 06/08/21 1654  SpO2 95 % 06/08/21 1654  Vitals shown include unvalidated device data.  Last Pain:  Vitals:   06/08/21 1530  TempSrc:   PainSc: 0-No pain         Complications: No notable events documented.

## 2021-06-08 NOTE — Op Note (Signed)
Operative note June 08, 2021  Ellinore Merced MD  Preoperative diagnosis history of ischemic insult left upper extremity with ischemia left index finger and ischemic change about the left small finger lateral aspect.  Postop diagnosis: Same  Procedure #1 P2 level amputation distally with bilateral neurectomies left index finger #2 debridement of left small finger skin and subcutaneous tissue  Leilene Diprima MD  Anesthesia Block with IV sedation  Tourniquet time less than 20 minutes with a Penrose tourniquet around the finger no tourniquet used around the arm or forearm.  Description of procedure: Patient was seen by myself and anesthesia and taken to the operative theater and underwent a smooth induction of IV sedation he was prepped and draped with Hibiclens followed by 10-minute surgical Betadine scrub and paint.  Timeout was observed.  Penrose drain was placed under sterile field and a curvilinear incision was made.  The patient had disarticulation of the distal phalanx and following this the FDP was advanced severed and allowed to recover.  I remove the volar plate and remove portions of the distal P2 bone.  I then performed bilateral neurectomies and then sculpted a flap for purposes of coverage.  He tolerated this well.  Tourniquet time was less than 20 minutes and he had good refill and an excellent looking closure.  Following this I performed debridement of the left small finger skin and subcutaneous tissue there were no complications with this and the patient did quite well.  I was quite pleased with how he looked overall in terms of the debridement process and the neovascularization beneath.  He has a epithelial layer which hopefully will continue to evolve in a steady healthy fashion.  Standard dressing of Adaptic Xeroform 4 x 4's gauze finger splint on the index finger and free dressing on the small finger was applied.  He will be discharged home tonight.  He will continue his usual dialysis and  other measures and I will see him back in the office in a week.  Debbrah Sampedro MD

## 2021-06-08 NOTE — Anesthesia Postprocedure Evaluation (Signed)
Anesthesia Post Note  Patient: Kevin Meyer  Procedure(s) Performed: Left index finger amputation with bilateral neurectomies.  Left small finger debridement. (Left: Hand)     Patient location during evaluation: PACU Anesthesia Type: Regional and MAC Level of consciousness: awake and alert and oriented Pain management: pain level controlled Vital Signs Assessment: post-procedure vital signs reviewed and stable Respiratory status: spontaneous breathing, nonlabored ventilation and respiratory function stable Cardiovascular status: stable and blood pressure returned to baseline Postop Assessment: no apparent nausea or vomiting Anesthetic complications: no   No notable events documented.  Last Vitals:  Vitals:   06/08/21 1530 06/08/21 1654  BP: (!) 170/89 113/69  Pulse: 85 93  Resp: 14 19  Temp:  36.7 C  SpO2: 97% 95%    Last Pain:  Vitals:   06/08/21 1530  TempSrc:   PainSc: 0-No pain                 Eion Timbrook A.

## 2021-06-08 NOTE — Discharge Instructions (Signed)
Please keep your bandage clean and dry.  You may resume all of your regular medicines.  Please call us for any problems.

## 2021-06-08 NOTE — H&P (Signed)
Kevin Meyer is an 51 y.o. male.   Chief Complaint:ischemia right index and small finger HPI: Patient presents for evaluation and treatment of the of their upper extremity predicament. The patient denies neck, back, chest or  abdominal pain. The patient notes that they have no lower extremity problems. The patients primary complaint is noted. We are planning surgical care pathway for the upper extremity.   Past Medical History:  Diagnosis Date   Anemia    Diabetes mellitus without complication ([place])    Type II   Gout    High cholesterol    Hypertension    Osteoarthritis    Renal disorder     Past Surgical History:  Procedure Laterality Date   AORTIC ARCH ANGIOGRAPHY N/A 12/25/2020   Procedure: AORTIC ARCH ANGIOGRAPHY;  Surgeon: Angelia Mould, MD;  Location: [facility];  Service: Cardiovascular;  Laterality: N/A;   AV FISTULA PLACEMENT Left 02/20/2020   Procedure: LEFT ARM ARTERIOVENOUS (AV) FISTULA CREATION;  Surgeon: Rosetta Posner, MD;  Location: [facility];  Service: Vascular;  Laterality: Left;   AV FISTULA PLACEMENT Left 08/20/2020   Procedure: LEFT ARM BRACHIAL CEPHALIC ARTERIOVENOUS (AV) FISTULA CREATION;  Surgeon: Rosetta Posner, MD;  Location: [facility];  Service: Vascular;  Laterality: Left;   DISTAL REVASCULARIZATION AND INTERVAL LIGATION (DRIL) Left 12/30/2020   Procedure: LEFT BRACHIAL TO BRACHIAL BYPASS USING LEFT GSV, LIGATION OF LEFT BRACHIAL ARTERY;  Surgeon: Cherre Robins, MD;  Location: [facility];  Service: Vascular;  Laterality: Left;   EXCHANGE OF A DIALYSIS CATHETER Right 09/03/2020   Procedure: EXCHANGE OF A TUNNELED DIALYSIS CATHETER;  Surgeon: Rosetta Posner, MD;  Location: [facility];  Service: Vascular;  Laterality: Right;   INSERTION OF DIALYSIS CATHETER Right 06/24/2020   Procedure: INSERTION OF DIALYSIS CATHETER;  Surgeon: Virl Cagey, MD;  Location: [facility];  Service: General;  Laterality: Right;   INSERTION OF DIALYSIS CATHETER Left 01/04/2021    Procedure: INSERTION OF LEFT INTERNAL JUGULAR TUNNELED DIALYSIS CATHETER;  Surgeon: Cherre Robins, MD;  Location: [facility];  Service: Vascular;  Laterality: Left;   LIGATION OF ARTERIOVENOUS  FISTULA Left 08/20/2020   Procedure: LIGATION OF  LEFT RADIOCEPHALIC ARTERIOVENOUS  FISTULA;  Surgeon: Rosetta Posner, MD;  Location: [facility];  Service: Vascular;  Laterality: Left;   NO PAST SURGERIES     REMOVAL OF A DIALYSIS CATHETER N/A 02/09/2021   Procedure: MINOR REMOVAL OF TUNNELED DIALYSIS CATHETER;  Surgeon: Rosetta Posner, MD;  Location: [facility];  Service: Vascular;  Laterality: N/A;  pt to arrive 1 hour prior & be NPO   UPPER EXTREMITY ANGIOGRAPHY Left 12/25/2020   Procedure: UPPER EXTREMITY ANGIOGRAPHY;  Surgeon: Angelia Mould, MD;  Location: [facility];  Service: Cardiovascular;  Laterality: Left;   VEIN HARVEST Left 12/30/2020   Procedure: VEIN HARVEST OF LEFT GSV;  Surgeon: Cherre Robins, MD;  Location: [facility];  Service: Vascular;  Laterality: Left;    Family History  Problem Relation Age of Onset   Stroke Mother    Diabetes Other    Hypertension Father    Social History:  reports that he has never smoked. He has never used smokeless tobacco. He reports that he does not drink alcohol and does not use drugs.  Allergies:  Allergies  Allergen Reactions   Gabapentin Anaphylaxis   Lyrica [Pregabalin] Anaphylaxis   Vancomycin Nausea And Vomiting    Headache    Medications Prior to Admission  Medication Sig Dispense Refill   aspirin EC 81 MG tablet Take 81 mg by mouth daily.     atorvastatin (LIPITOR) 40 MG tablet Take 40 mg by mouth at bedtime.     Cholecalciferol (VITAMIN D) 50 MCG (2000 UT) CAPS Take 2,000 Units by mouth daily.     Dulaglutide (TRULICITY) 6.23 JS/2.8BT SOPN Inject 0.5 mg into the skin every Tuesday.     folic acid (FOLVITE) 1 MG tablet Take 1 tablet (1 mg total) by mouth daily. 30 tablet 3   glipiZIDE (GLUCOTROL) 10 MG tablet Take 1 tablet (10 mg total)  by mouth 2 (two) times daily before a meal.     multivitamin (RENA-VIT) TABS tablet Take 1 tablet by mouth daily.     VELPHORO 500 MG chewable tablet Chew 1,000 mg by mouth 3 (three) times daily with meals.     vitamin B-12 1000 MCG tablet Take 1 tablet (1,000 mcg total) by mouth daily. 30 tablet 1   midodrine (PROAMATINE) 5 MG tablet Take 1 tablet (5 mg total) by mouth 3 (three) times a week. (Patient not taking: Reported on 06/07/2021) 90 tablet 3    Results for orders placed or performed during the hospital encounter of 06/08/21 (from the past 48 hour(s))  Glucose, capillary     Status: Abnormal   Collection Time: 06/08/21  1:00 PM  Result Value Ref Range   Glucose-Capillary 152 (H) 70 - 99 mg/dL    Comment: Glucose reference range applies only to samples taken after fasting for at least 8 hours.  I-STAT, chem 8     Status: Abnormal   Collection Time: 06/08/21  1:19 PM  Result Value Ref Range   Sodium 138 135 - 145 mmol/L   Potassium 4.3 3.5 - 5.1 mmol/L   Chloride 99 98 - 111 mmol/L   BUN 31 (H) 6 - 20 mg/dL   Creatinine, Ser 8.10 (H) 0.61 - 1.24 mg/dL   Glucose, Bld 159 (H) 70 - 99 mg/dL    Comment: Glucose reference range applies only to samples taken after fasting for at least 8 hours.   Calcium, Ion 1.05 (L) 1.15 - 1.40 mmol/L   TCO2 30 22 - 32 mmol/L   Hemoglobin 12.2 (L) 13.0 - 17.0 g/dL   HCT 36.0 (L) 39.0 - 52.0 %   No results found.  Review of Systems  Respiratory: Negative.    Cardiovascular: Negative.    Blood pressure (!) 170/89, pulse 85, temperature 98.1 F (36.7 C), temperature source Oral, resp. rate 14, height 5\' 6"  (1.676 m), weight 97.1 kg, SpO2 97 %. Physical Exam we we will plan for surgical debridement and amputation left index finger as described in the office.  He has ischemic changes about the left index finger and left small finger we are going to wait on the left small finger and see how this declares but plan for definitive care of the left  index.  Chest is clear abdomen is nontender no shortness of breath lower extremity examination is stable.  Assessment/Plan We are planning surgery for your upper extremity. The risk and benefits of surgery to include risk of bleeding, infection, anesthesia,  damage to normal structures and failure of the surgery to accomplish its intended goals of relieving symptoms and restoring function have been discussed in detail. With this in mind we plan to proceed. I have specifically discussed with the patient the pre-and postoperative regime and the dos and don'ts and risk and benefits in great detail.  Risk and benefits of surgery also include risk of dystrophy(CRPS), chronic nerve pain, failure of the healing process to go onto completion and other inherent risks of surgery The relavent the pathophysiology of the disease/injury process, as well as the alternatives for treatment and postoperative course of action has been discussed in great detail with the patient who desires to proceed.  We will do everything in our power to help you (the patient) restore function to the upper extremity. It is a pleasure to see this patient today.  We will plan for surgical amputation left index finger and debridement as necessary left small finger in an effort to decrease his pain and give him a better functional left hand.  He understands the risk and benefits and other issues as it is germane to the upper extremity predicament.  All questions have been addressed. Willa Frater III, MD 06/08/2021, 3:50 PM

## 2021-06-08 NOTE — Anesthesia Procedure Notes (Signed)
Anesthesia Regional Block: Supraclavicular block   Pre-Anesthetic Checklist: , timeout performed,  Correct Patient, Correct Site, Correct Laterality,  Correct Procedure, Correct Position, site marked,  Risks and benefits discussed,  Surgical consent,  Pre-op evaluation,  At surgeon's request and post-op pain management  Laterality: Left  Prep: chloraprep       Needles:  Injection technique: Single-shot  Needle Type: Echogenic Stimulator Needle     Needle Length: 10cm  Needle Gauge: 21   Needle insertion depth: 5 cm   Additional Needles:   Procedures:,,,, ultrasound used (permanent image in chart),,   Motor weakness within 5 minutes.  Narrative:  Start time: 06/08/2021 3:20 PM End time: 06/08/2021 3:25 PM Injection made incrementally with aspirations every 5 mL. Anesthesiologist: Josephine Igo, MD  Additional Notes: Timeout performed. Patient sedated. Relevant anatomy ID'd using [place]. Incremental 2-26ml injection of LA with frequent aspiration. Patient tolerated procedure well.    Left Supraclavicular Block

## 2021-06-09 ENCOUNTER — Encounter (HOSPITAL_COMMUNITY): Payer: Self-pay | Admitting: Orthopedic Surgery

## 2021-06-09 ENCOUNTER — Other Ambulatory Visit: Payer: Self-pay

## 2021-09-23 DEATH — deceased

## 2021-09-24 IMAGING — US US EXTREM UP VEIN MAPPING UNILAT
1 series · 13 of 24 positions shown · non-contrast
Comparison: None.

CLINICAL DATA: Renal insufficiency. Please perform bilateral upper
extremity venous mapping for dialysis access planning purposes.

EXAM:
US EXTREM UP VEIN MAPPING

[Series 1: us ue vein mapping right (pre-op avf) · non-contrast · 13 of 137 slices shown]
[im 1/137]
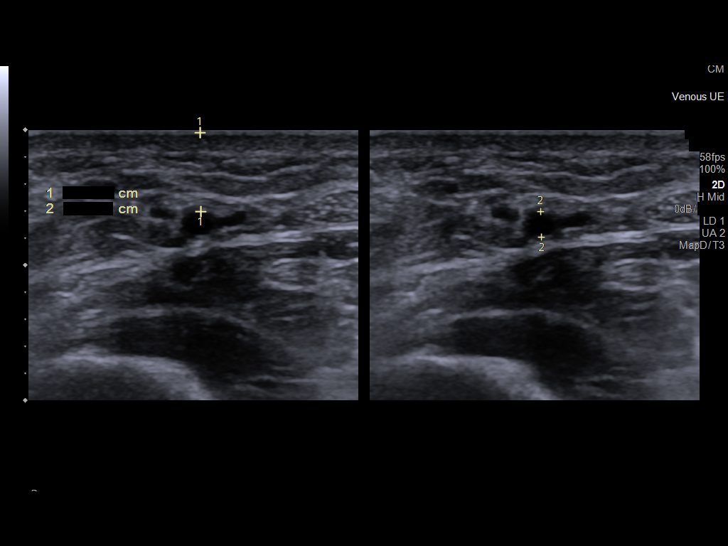
[im 12/137]
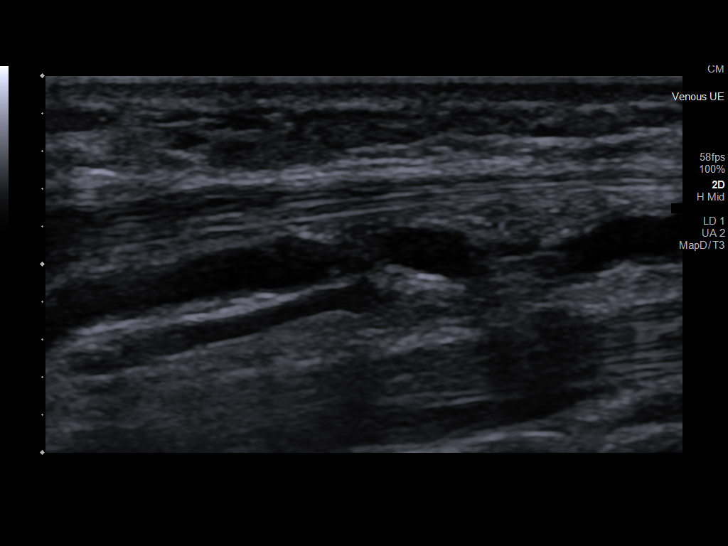
[im 24/137]
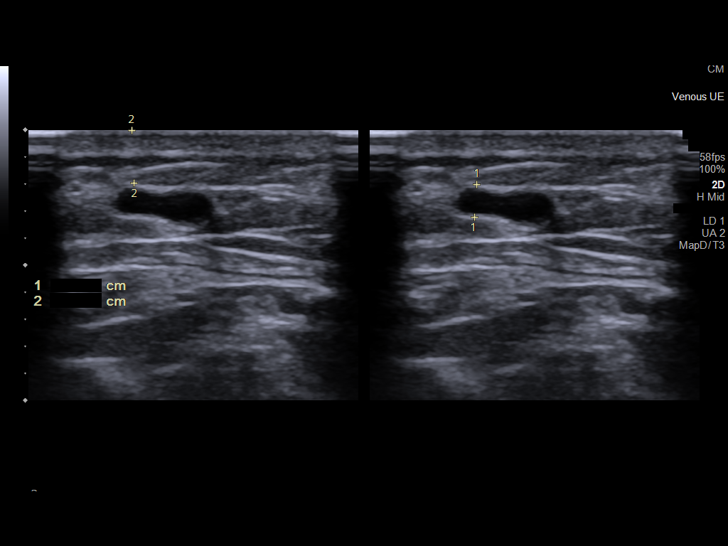
[im 36/137]
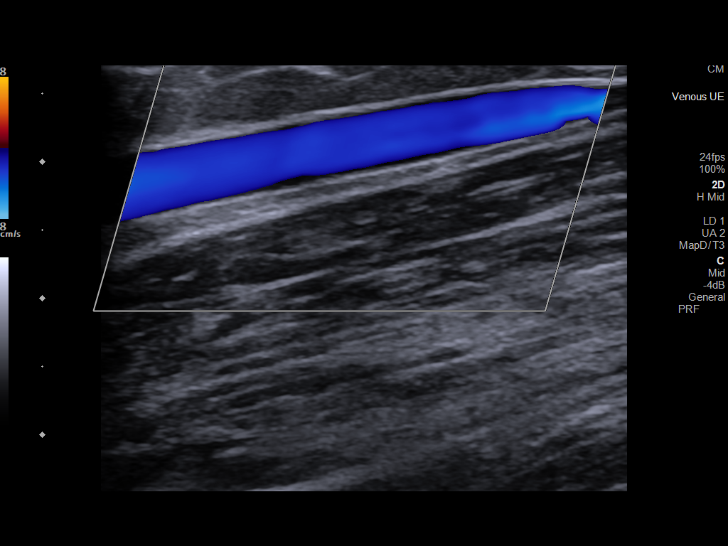
[im 48/137]
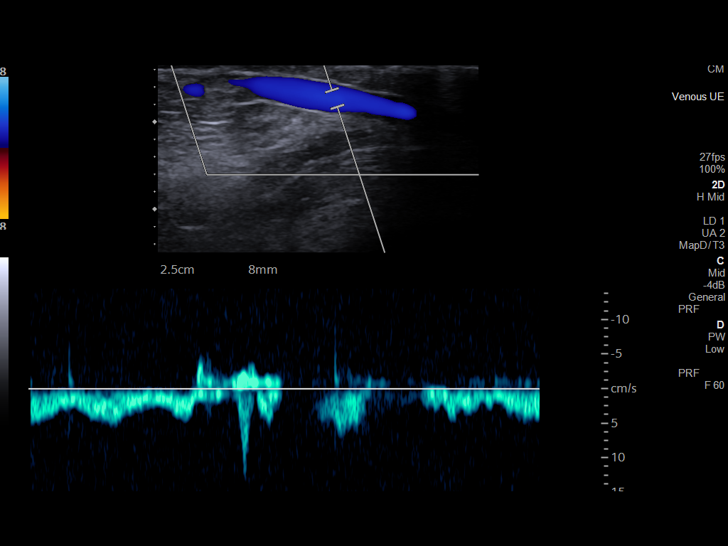
[im 60/137]
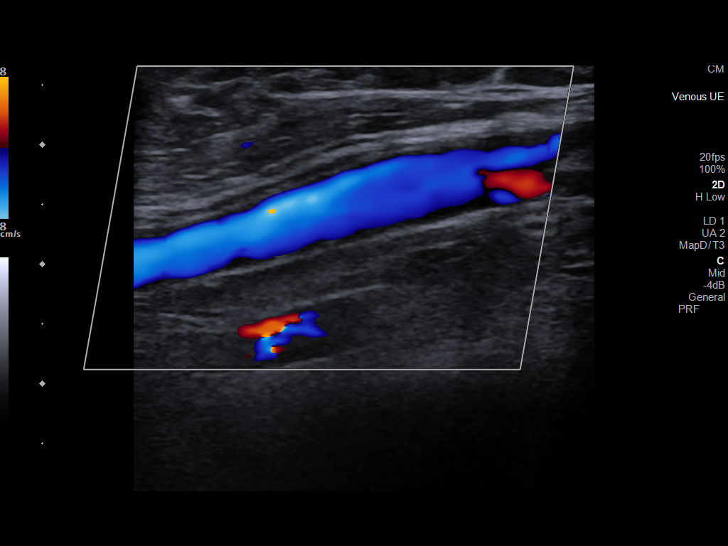
[im 71/137]
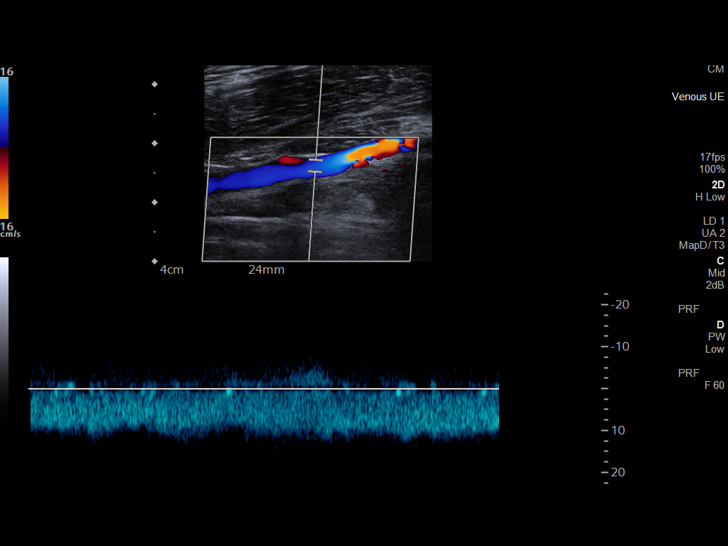
[im 77/137]
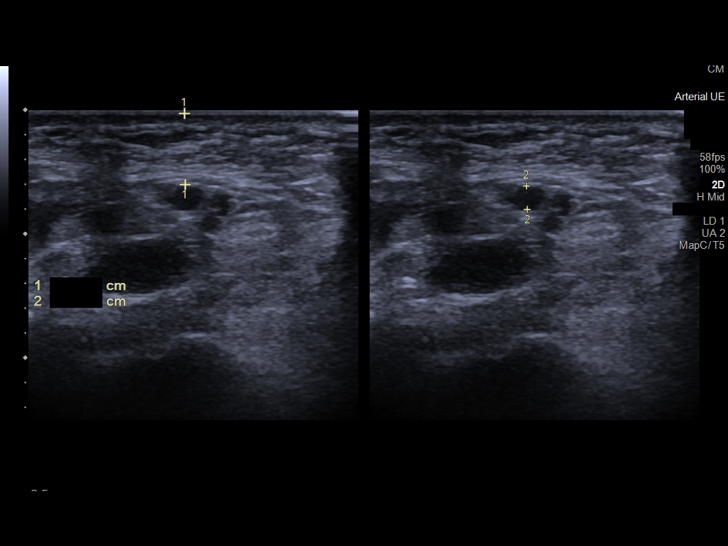
[im 89/137]
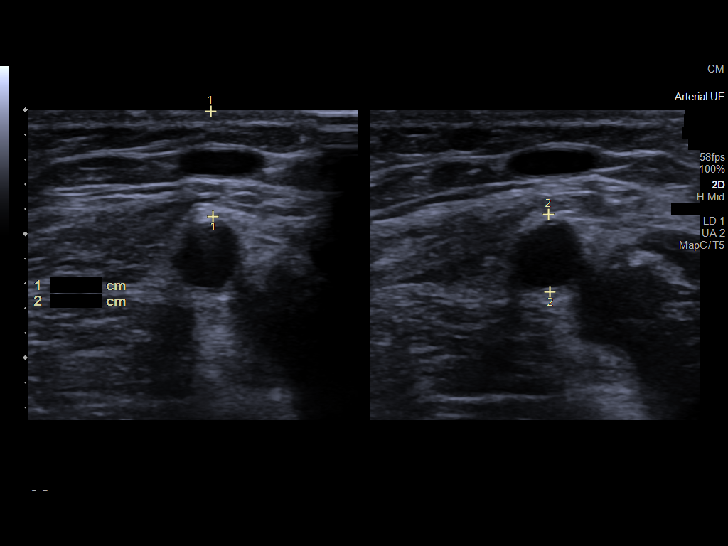
[im 101/137]
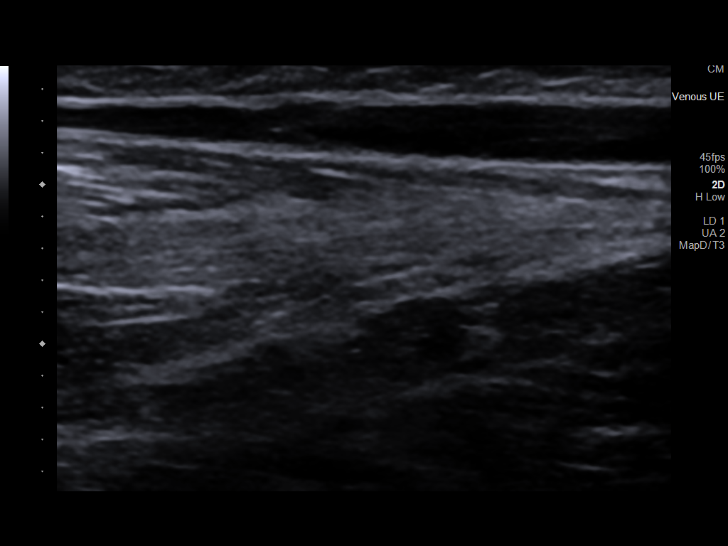
[im 113/137]
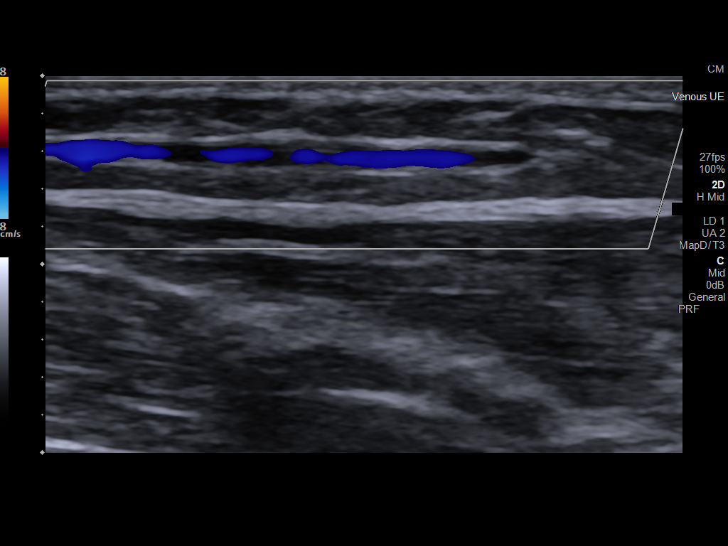
[im 125/137]
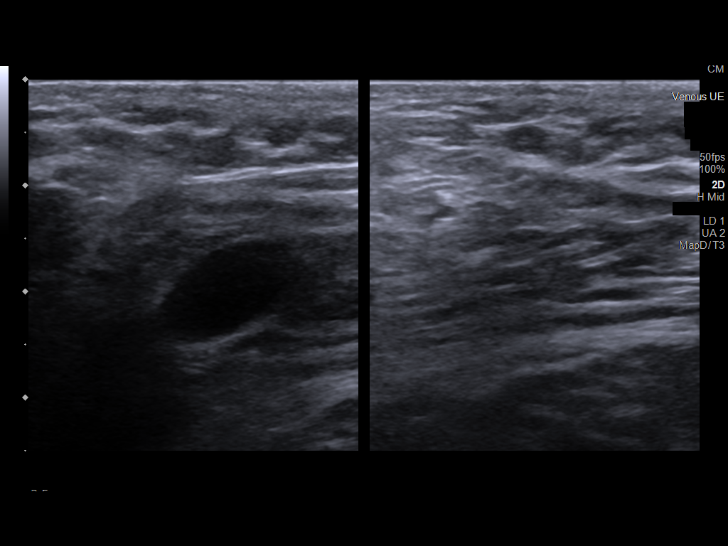
[im 137/137]
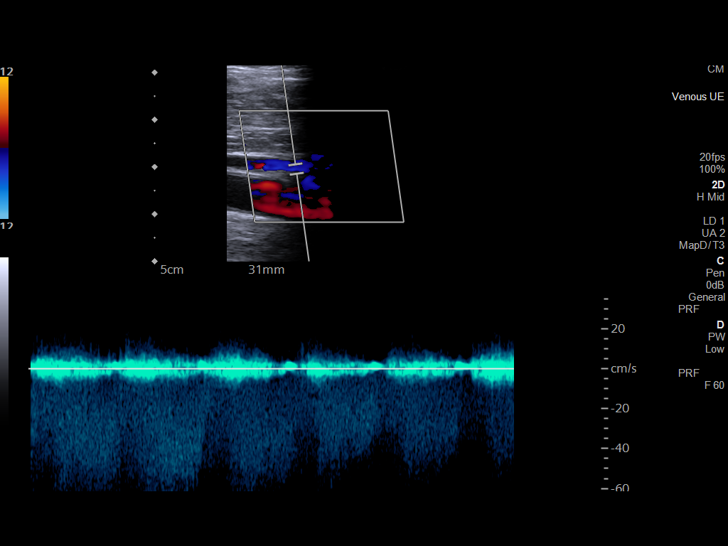

[13 of 24 positions shown; findings below may reference images not displayed]

FINDINGS: RIGHT ARTERIES

Wrist Radial Artery:

Size 1.9mm Waveform Triphasic

Wrist Ulnar Artery:

Size 2.4 mm Waveform Triphasic

Prox. Forearm Radial Artery:

Size 2.8 mm Waveform Triphasic

Upper Arm Brachial Artery:

Size 4.7 mm Waveform Triphasic

RIGHT VEINS

Forearm Cephalic Vein:

Prox 5.9 mm Distal 3.6 mm Depth 2.1mm

Upper Arm Cephalic Vein:

Prox 5.4 mm Distal 3.0 mm Depth 3.2mm

Upper Arm Basilic Vein:

Prox 4.7 mm Distal 0.7 mm Depth 9.0mm

Upper Arm Brachial Vein:

Prox 4.4 mm Distal 5.9 mm Depth 13.9mm

ADDITIONAL RIGHT VEINS

Axillary Vein: 7.9 mm

Subclavian Vein:

Patient: Yes Respiratory Phasicity: Present

Internal Jugular Vein:

Patent: Yes Respiratory Phasicity: Present

Branches > 2 mm:

None

_________________________________________________________

LEFT ARTERIES

Wrist Radial Artery:

Size 2.2 mm Waveform Triphasic

Wrist Ulnar Artery:

Size 1.9 mm Waveform Triphasic

Prox. Forearm Radial Artery:

Size 2.3 mm Waveform Triphasic

Upper Arm Brachial Artery:

Size 6.3 mm Waveform Triphasic

LEFT VEINS

Forearm Cephalic Vein:

Prox 4.1 mm Distal 3.5 mm Depth 3.6mm

Upper Arm Cephalic Vein:

Prox 3.7 mm Distal 4.7 mm Depth 3.3mm

Upper Arm Basilic Vein:

Prox 5.0 mm Distal 5.1 mm Depth 5.3mm

Upper Arm Brachial Vein:

Prox 4.2 mm Distal 3.8 mm Depth 15mm

ADDITIONAL LEFT VEINS

Axillary Vein: 10.0 mm

Subclavian Vein:

Patient: Yes Respiratory Phasicity: Present

Internal Jugular Vein:

Patent: Yes    Respiratory Phasicity: Present

Branches > 2 mm:

None visualized
IMPRESSION: 1. Bilateral upper extremity venous mapping as above.
2. No evidence of DVT within either upper extremity.

## 2021-09-24 IMAGING — US US EXTREM UP VEIN MAPPING UNILAT
1 series · 13 of 24 positions shown · non-contrast
Comparison: None.

CLINICAL DATA: Renal insufficiency. Please perform bilateral upper
extremity venous mapping for dialysis access planning purposes.

EXAM:
US EXTREM UP VEIN MAPPING

[Series 1: us ue vein mapping right (pre-op avf) · non-contrast · 13 of 137 slices shown]
[im 1/137]
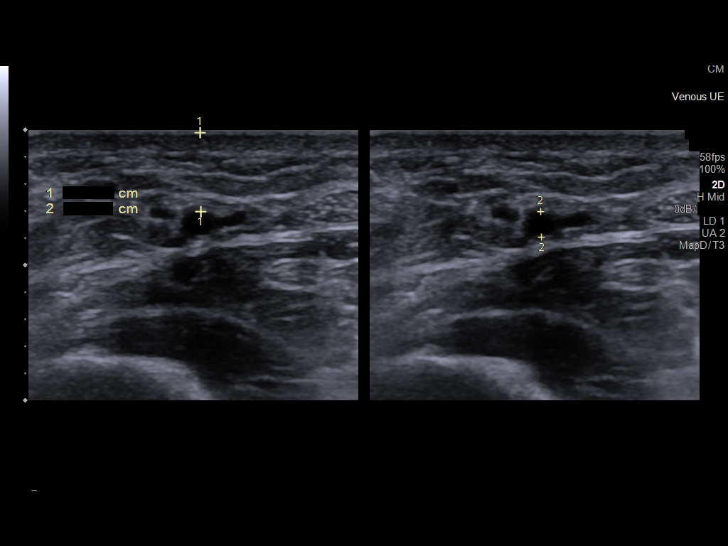
[im 12/137]
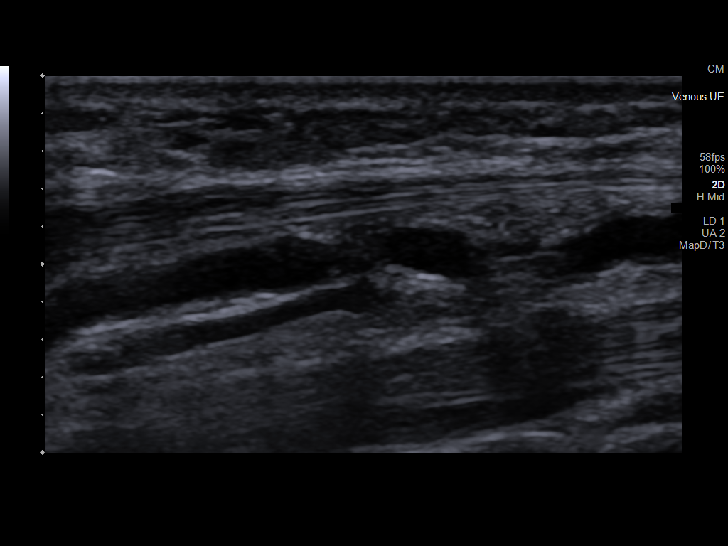
[im 24/137]
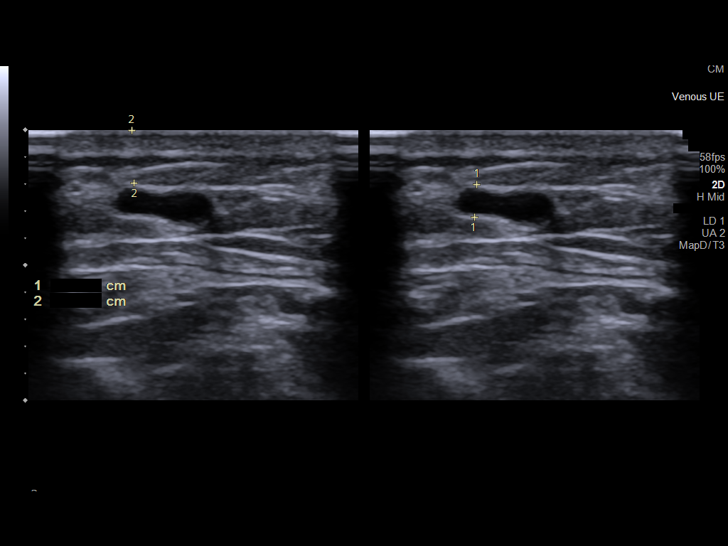
[im 36/137]
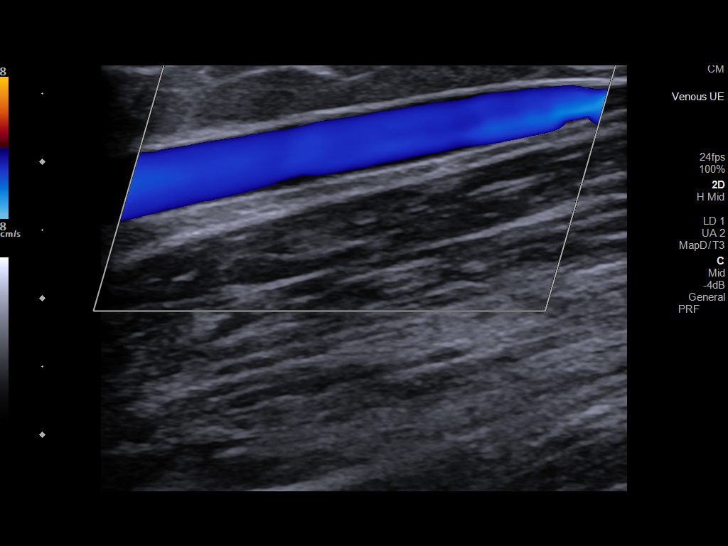
[im 48/137]
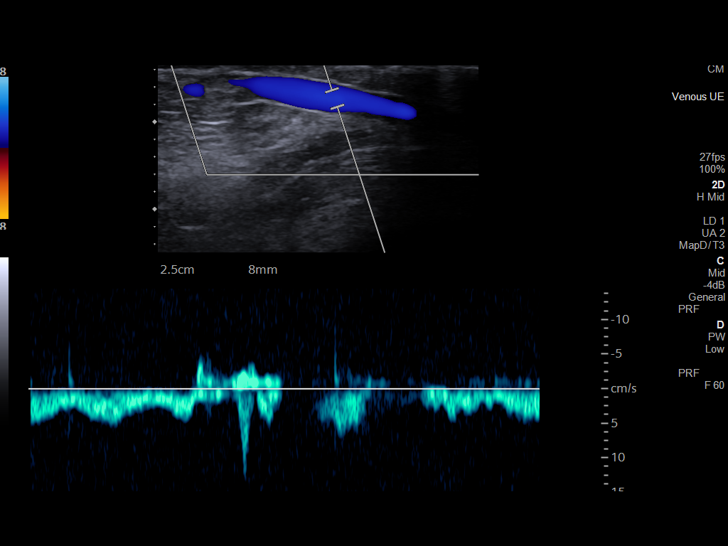
[im 60/137]
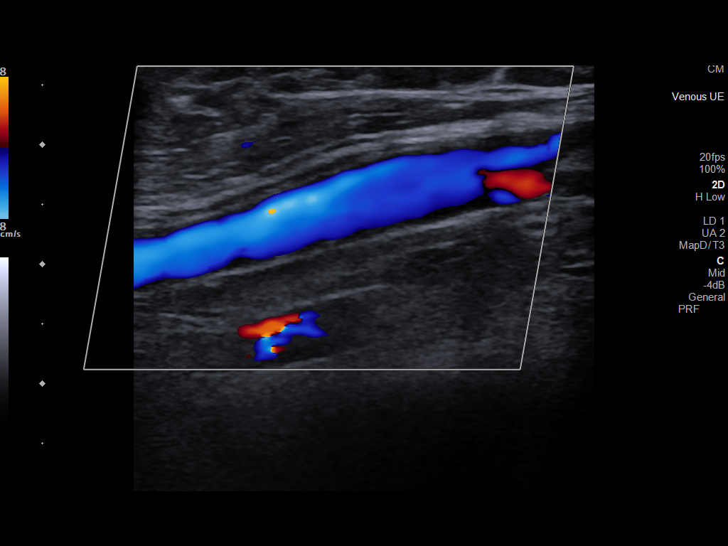
[im 71/137]
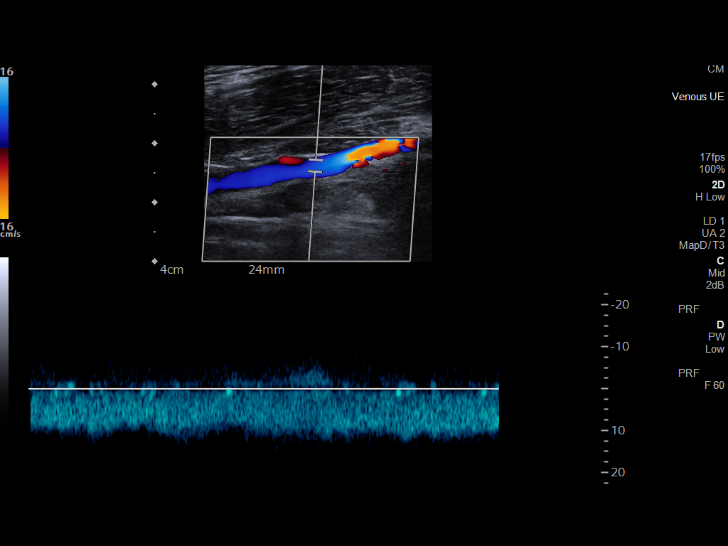
[im 77/137]
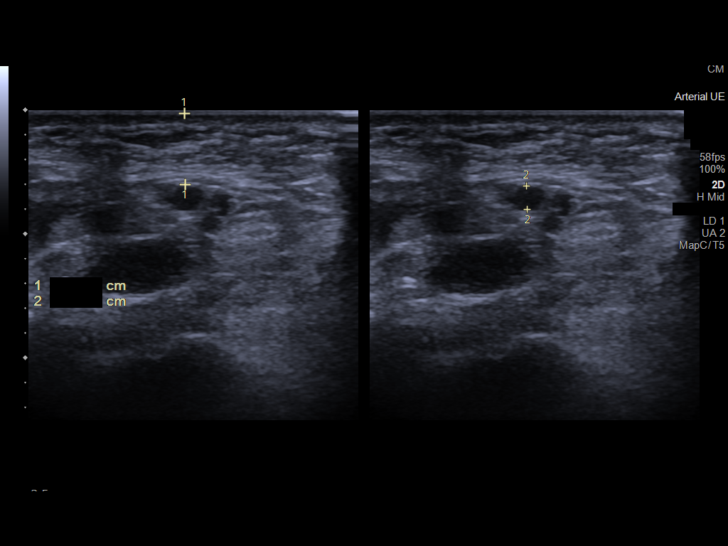
[im 89/137]
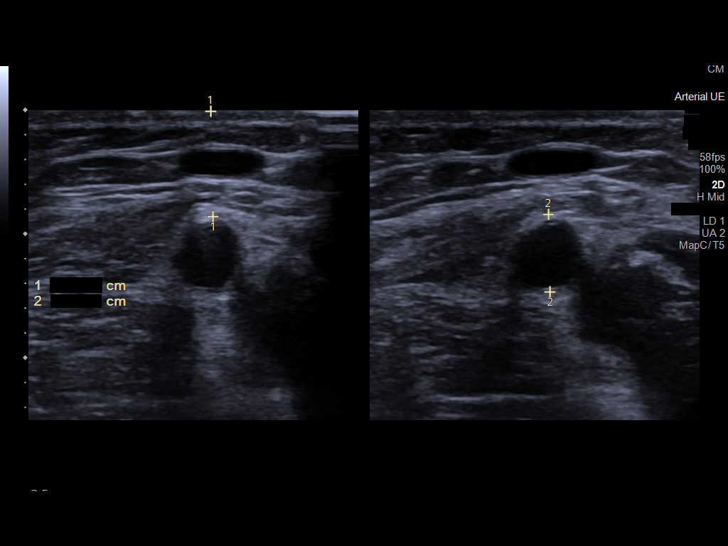
[im 101/137]
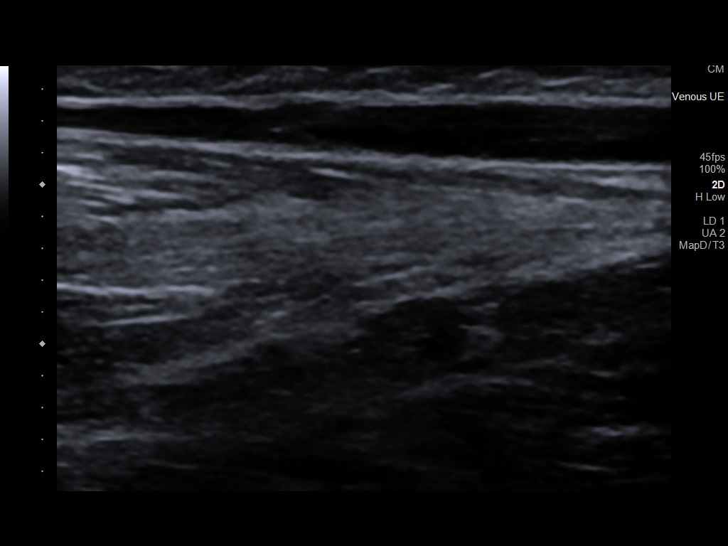
[im 113/137]
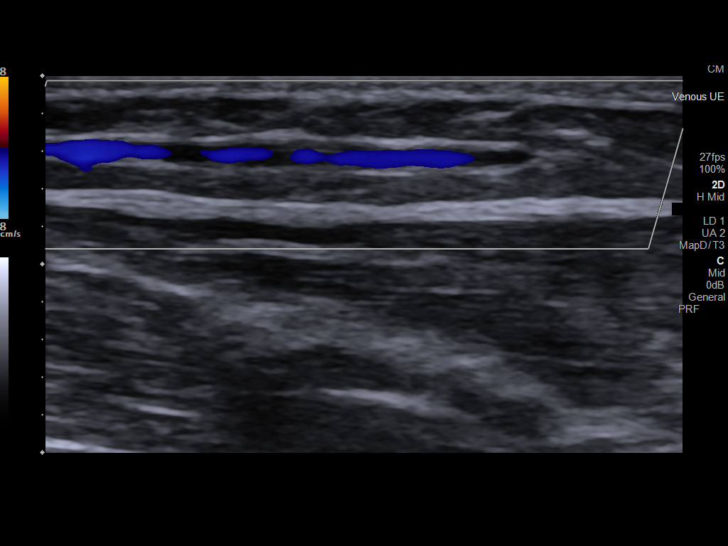
[im 125/137]
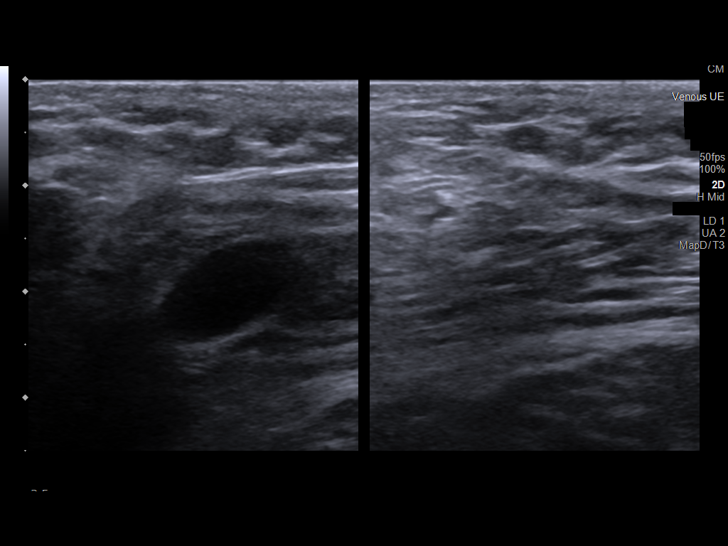
[im 137/137]
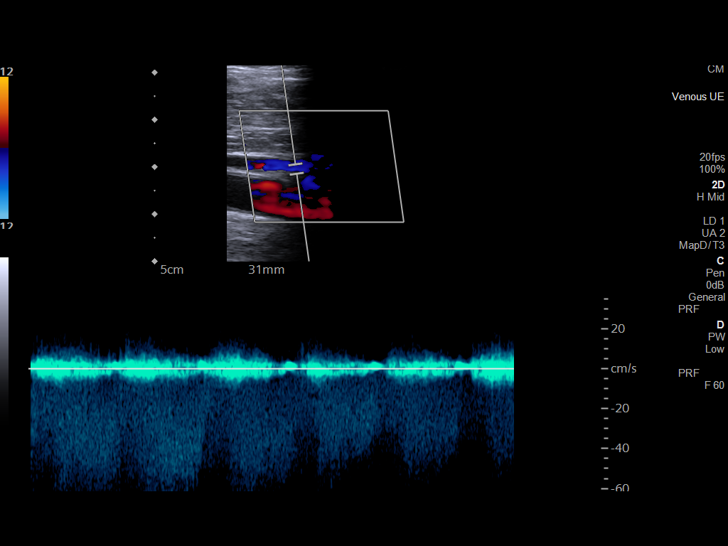

[13 of 24 positions shown; findings below may reference images not displayed]

FINDINGS: RIGHT ARTERIES

Wrist Radial Artery:

Size 1.9mm Waveform Triphasic

Wrist Ulnar Artery:

Size 2.4 mm Waveform Triphasic

Prox. Forearm Radial Artery:

Size 2.8 mm Waveform Triphasic

Upper Arm Brachial Artery:

Size 4.7 mm Waveform Triphasic

RIGHT VEINS

Forearm Cephalic Vein:

Prox 5.9 mm Distal 3.6 mm Depth 2.1mm

Upper Arm Cephalic Vein:

Prox 5.4 mm Distal 3.0 mm Depth 3.2mm

Upper Arm Basilic Vein:

Prox 4.7 mm Distal 0.7 mm Depth 9.0mm

Upper Arm Brachial Vein:

Prox 4.4 mm Distal 5.9 mm Depth 13.9mm

ADDITIONAL RIGHT VEINS

Axillary Vein: 7.9 mm

Subclavian Vein:

Patient: Yes Respiratory Phasicity: Present

Internal Jugular Vein:

Patent: Yes Respiratory Phasicity: Present

Branches > 2 mm:

None

_________________________________________________________

LEFT ARTERIES

Wrist Radial Artery:

Size 2.2 mm Waveform Triphasic

Wrist Ulnar Artery:

Size 1.9 mm Waveform Triphasic

Prox. Forearm Radial Artery:

Size 2.3 mm Waveform Triphasic

Upper Arm Brachial Artery:

Size 6.3 mm Waveform Triphasic

LEFT VEINS

Forearm Cephalic Vein:

Prox 4.1 mm Distal 3.5 mm Depth 3.6mm

Upper Arm Cephalic Vein:

Prox 3.7 mm Distal 4.7 mm Depth 3.3mm

Upper Arm Basilic Vein:

Prox 5.0 mm Distal 5.1 mm Depth 5.3mm

Upper Arm Brachial Vein:

Prox 4.2 mm Distal 3.8 mm Depth 15mm

ADDITIONAL LEFT VEINS

Axillary Vein: 10.0 mm

Subclavian Vein:

Patient: Yes Respiratory Phasicity: Present

Internal Jugular Vein:

Patent: Yes    Respiratory Phasicity: Present

Branches > 2 mm:

None visualized
IMPRESSION: 1. Bilateral upper extremity venous mapping as above.
2. No evidence of DVT within either upper extremity.

## 2022-04-23 IMAGING — DX DG CHEST 1V PORT
1 series · 1 of 1 positions shown · non-contrast
Comparison: 06/24/2020

CLINICAL DATA: Met dialysis catheter placement

EXAM:
PORTABLE CHEST 1 VIEW

[chest ap]
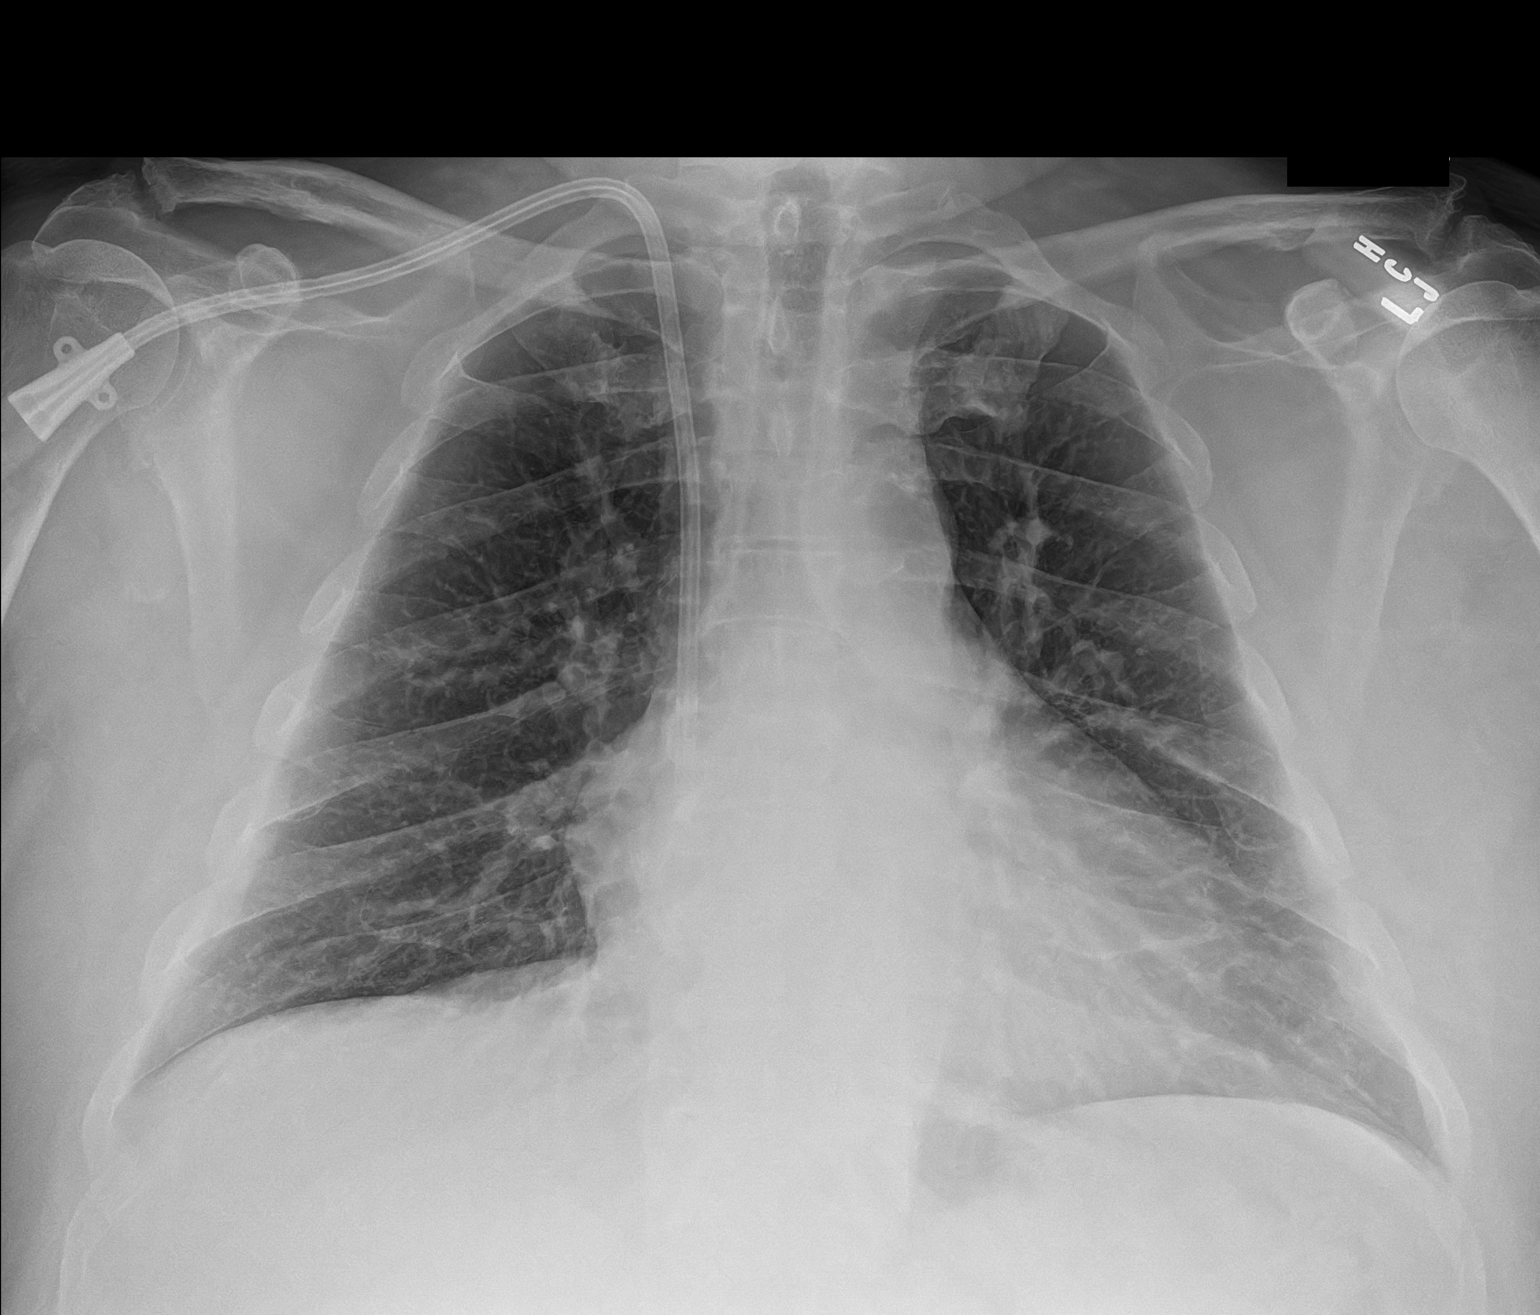

[1 of 1 positions shown; findings below may reference images not displayed]

FINDINGS: Right IJ dialysis catheter with tip at the upper cavoatrial
junction. No mediastinal widening, effusion, or pneumothorax.
Borderline vascular congestion. Stable heart size.
IMPRESSION: New dialysis catheter without complicating features.
